# Patient Record
Sex: Female | Born: 2000 | Race: White | Hispanic: No | Marital: Married | Smoking: Never smoker
Health system: Southern US, Community
[De-identification: ages and names within clinical notes are randomized; demographics above are authoritative.]

## PROBLEM LIST (undated history)

## (undated) ENCOUNTER — Inpatient Hospital Stay: Payer: Self-pay

## (undated) ENCOUNTER — Inpatient Hospital Stay

## (undated) DIAGNOSIS — N926 Irregular menstruation, unspecified: Secondary | ICD-10-CM

## (undated) DIAGNOSIS — R109 Unspecified abdominal pain: Secondary | ICD-10-CM

## (undated) DIAGNOSIS — K589 Irritable bowel syndrome without diarrhea: Secondary | ICD-10-CM

## (undated) DIAGNOSIS — Z808 Family history of malignant neoplasm of other organs or systems: Secondary | ICD-10-CM

## (undated) DIAGNOSIS — G43009 Migraine without aura, not intractable, without status migrainosus: Secondary | ICD-10-CM

## (undated) DIAGNOSIS — M940 Chondrocostal junction syndrome [Tietze]: Secondary | ICD-10-CM

## (undated) DIAGNOSIS — Q211 Atrial septal defect, unspecified: Secondary | ICD-10-CM

## (undated) DIAGNOSIS — R1110 Vomiting, unspecified: Secondary | ICD-10-CM

## (undated) HISTORY — DX: Irritable bowel syndrome, unspecified: K58.9

## (undated) HISTORY — PX: WISDOM TOOTH EXTRACTION: SHX21

## (undated) HISTORY — DX: Irregular menstruation, unspecified: N92.6

## (undated) HISTORY — DX: Vomiting, unspecified: R11.10

## (undated) HISTORY — DX: Atrial septal defect, unspecified: Q21.10

## (undated) HISTORY — DX: Chondrocostal junction syndrome (tietze): M94.0

## (undated) HISTORY — DX: Unspecified abdominal pain: R10.9

## (undated) HISTORY — DX: Family history of malignant neoplasm of other organs or systems: Z80.8

## (undated) HISTORY — DX: Migraine without aura, not intractable, without status migrainosus: G43.009

---
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
## 2019-01-04 DIAGNOSIS — H5213 Myopia, bilateral: Secondary | ICD-10-CM | POA: Diagnosis not present

## 2019-01-26 DIAGNOSIS — H5213 Myopia, bilateral: Secondary | ICD-10-CM | POA: Diagnosis not present

## 2019-01-27 ENCOUNTER — Other Ambulatory Visit (HOSPITAL_COMMUNITY)
Admission: RE | Admit: 2019-01-27 | Discharge: 2019-01-27 | Disposition: A | Discharge status: Home / Self Care | Payer: Medicaid Other | Source: Ambulatory Visit | Attending: Nurse Practitioner | Admitting: Nurse Practitioner

## 2019-01-27 ENCOUNTER — Encounter: Payer: Self-pay | Admitting: Nurse Practitioner

## 2019-01-27 ENCOUNTER — Ambulatory Visit (INDEPENDENT_AMBULATORY_CARE_PROVIDER_SITE_OTHER): Payer: Medicaid Other | Admitting: Nurse Practitioner

## 2019-01-27 ENCOUNTER — Other Ambulatory Visit: Payer: Self-pay

## 2019-01-27 VITALS — BP 116/66 | HR 80 | Temp 97.7°F | Ht 63.5 in | Wt 112.2 lb

## 2019-01-27 DIAGNOSIS — Z118 Encounter for screening for other infectious and parasitic diseases: Secondary | ICD-10-CM | POA: Diagnosis not present

## 2019-01-27 DIAGNOSIS — Z0000 Encounter for general adult medical examination without abnormal findings: Secondary | ICD-10-CM | POA: Diagnosis not present

## 2019-01-27 DIAGNOSIS — Z3041 Encounter for surveillance of contraceptive pills: Secondary | ICD-10-CM | POA: Diagnosis not present

## 2019-01-27 MED ORDER — NORGESTREL-ETHINYL ESTRADIOL 0.3-30 MG-MCG PO TABS: 1.0000 | ORAL_TABLET | Freq: Every day | ORAL | 4 refills | Status: DC

## 2019-01-27 NOTE — Patient Instructions (Addendum)
April Fleming,   Thank you for coming in to clinic today.  1. Work on good stress management strategies like deep breathing instead of your vape pen/nicotine use.  2. Keep living a healthy, active lifestyle  3. You will be due for [place].  This means you should eat no food or drink after midnight.  Drink only water or coffee without cream/sugar on the morning of your lab visit. - Please go ahead and schedule a "Lab Only" visit in the morning at the clinic for lab draw in the next 7 days. - Your results will be available about 2-3 days after blood draw.  If you have set up a MyChart account, you can can log in to MyChart online to view your results and a brief explanation. Also, we can discuss your results together at your next office visit if you would like.   Please schedule a follow-up appointment with [name], AGNP. Return in about 1 year (around 01/28/2020) for annual physical.  If you have any other questions or concerns, please feel free to call the clinic or send a message through [place]. You may also schedule an earlier appointment if necessary.  You will receive a survey after today's visit either digitally by e-mail or paper by [employer]. Your experiences and feedback matter to [place].  Please respond so we know how we are doing as we provide care for you.   [name], DNP, AGNP-BC Adult Gerontology Nurse Practitioner [place]

## 2019-01-27 NOTE — Progress Notes (Signed)
Subjective:    Patient ID: April Fleming, female    DOB: 2001-07-15, 18 y.o.   MRN: [id number]  April Fleming is a 18 y.o. female presenting on 01/27/2019 for Annual Exam   HPI Annual Physical Exam Patient has been feeling well.  They have no acute concerns today.  Patient is very pleased with current contraception.  Sleeps 6-8 hours per night uninterrupted.  Works to second shift two nights per week, so this disrupts sleep routine.  HEALTH MAINTENANCE: Weight/BMI: helathy Physical activity: regular with work, none regularly outside of work Diet: Regular Seatbelt: Always Sunscreen: starting more regular use Chlamydia screening: due, negative in December 2019.  No new sexual partners. HIV: due Optometry: recent Dentistry: no regular adult, but recently has had pediatric care end.  VACCINES: Tetanus: done Influenza: declines Gardasil: recommended - check NCIR - patient does not recall if she received, but wants to get if needed.  Social History   Tobacco Use  . Smoking status: Never Smoker  . Smokeless tobacco: Never Used  Substance Use Topics  . Alcohol use: Never    Frequency: Never  . Drug use: Never    Review of Systems Per HPI unless specifically indicated above     Objective:    BP 139/68 (BP Location: Right Arm, Patient Position: Sitting, Cuff Size: Small)   Pulse 80   Temp 97.7 F (36.5 C) (Oral)   Ht 5' 3.5" (1.613 m)   Wt 112 lb 3.2 oz (50.9 kg)   LMP 12/27/2018   BMI 19.56 kg/m   Wt Readings from Last 3 Encounters:  01/27/19 112 lb 3.2 oz (50.9 kg) (22 %, Z= -0.79)*  12/10/18 113 lb 3.2 oz (51.3 kg) (24 %, Z= -0.71)*  10/19/18 116 lb 3.2 oz (52.7 kg) (31 %, Z= -0.50)*   * Growth percentiles are based on CDC (Girls, 2-20 Years) data.    Physical Exam Vitals signs and nursing note reviewed.  Constitutional:      General: She is not in acute distress.    Appearance: She is well-developed.  HENT:     Head: Normocephalic and atraumatic.    Right Ear: External ear normal.     Left Ear: External ear normal.     Nose: Nose normal.  Eyes:     Conjunctiva/sclera: Conjunctivae normal.     Pupils: Pupils are equal, round, and reactive to light.  Neck:     Musculoskeletal: Normal range of motion and neck supple.     Thyroid: No thyromegaly.     Vascular: No JVD.     Trachea: No tracheal deviation.  Cardiovascular:     Rate and Rhythm: Normal rate and regular rhythm.     Heart sounds: Normal heart sounds. No murmur. No friction rub. No gallop.   Pulmonary:     Effort: Pulmonary effort is normal. No respiratory distress.     Breath sounds: Normal breath sounds.  Chest:     Comments: Breast - Normal exam w/ symmetric breasts, no mass, no nipple discharge, no skin changes or tenderness. Abdominal:     General: Bowel sounds are normal. There is no distension.     Palpations: Abdomen is soft.     Tenderness: There is no abdominal tenderness.  Musculoskeletal: Normal range of motion.  Lymphadenopathy:     Cervical: No cervical adenopathy.  Skin:    General: Skin is warm and dry.     Capillary Refill: Capillary refill takes less than 2 seconds.  Neurological:  General: No focal deficit present.     Mental Status: She is alert and oriented to person, place, and time. Mental status is at baseline.     Cranial Nerves: No cranial nerve deficit.     Gait: Gait normal.  Psychiatric:        Behavior: Behavior normal.        Thought Content: Thought content normal.        Judgment: Judgment normal.    Results for orders placed or performed in visit on 12/10/18  Urine cytology ancillary only  Result Value Ref Range   Chlamydia Negative    Neisseria gonorrhea Negative       Assessment & Plan:   Problem List Items Addressed This Visit    None    Visit Diagnoses    Encounter for annual physical exam    -  Primary   Relevant Orders   Lipid panel   HIV Antibody (routine testing w rflx)   COMPLETE METABOLIC PANEL WITH GFR    CBC with Differential/Platelet   TSH   Urine cytology ancillary only   Screening for chlamydial disease       Relevant Orders   Urine cytology ancillary only   Encounter for surveillance of contraceptive pills       Relevant Medications   norgestrel-ethinyl estradiol (LO/OVRAL,CRYSELLE) 0.3-30 MG-MCG tablet    Annual physical exam with no new findings.  Well adult with no acute concerns.  No current problems with new OCP.  Continue without changes.  Plan: 1. Obtain health maintenance screenings as above according to age. - Increase physical activity to 30 minutes most days of the week.  - Eat healthy diet high in vegetables and fruits; low in refined carbohydrates. - Screening labs and tests as ordered 2. Return 1 year for annual physical.    Meds ordered this encounter  Medications  . norgestrel-ethinyl estradiol (LO/OVRAL,CRYSELLE) 0.3-30 MG-MCG tablet    Sig: Take 1 tablet by mouth daily.    Dispense:  3 Package    Refill:  4    Order Specific Question:   Supervising Provider    Answer:   Olin Hauser [[id number]]    Follow up plan: Return in about 1 year (around 01/28/2020) for annual physical.  [name], DNP, AGPCNP-BC Adult Gerontology Primary Care Nurse Practitioner [facility] 01/27/2019, 2:03 PM

## 2019-01-28 ENCOUNTER — Encounter: Payer: Medicaid Other | Admitting: Nurse Practitioner

## 2019-01-28 LAB — URINE CYTOLOGY ANCILLARY ONLY: Chlamydia: NEGATIVE

## 2019-02-02 ENCOUNTER — Other Ambulatory Visit: Payer: Medicaid Other

## 2019-05-18 ENCOUNTER — Ambulatory Visit: Payer: Medicaid Other | Admitting: Family Medicine

## 2019-05-18 ENCOUNTER — Other Ambulatory Visit: Payer: Self-pay

## 2019-05-18 ENCOUNTER — Encounter: Payer: Self-pay | Admitting: Family Medicine

## 2019-05-18 ENCOUNTER — Ambulatory Visit (INDEPENDENT_AMBULATORY_CARE_PROVIDER_SITE_OTHER): Payer: Medicaid Other | Admitting: Family Medicine

## 2019-05-18 DIAGNOSIS — Z3041 Encounter for surveillance of contraceptive pills: Secondary | ICD-10-CM

## 2019-05-18 NOTE — Patient Instructions (Addendum)
AVS info given by phone. Contact info for [facility] [address] [facility]. Referral info. No MyChart access

## 2019-05-18 NOTE — Progress Notes (Signed)
Virtual Visit via Telephone The purpose of this virtual visit is to provide medical care while limiting exposure to the novel coronavirus (COVID19) for both patient and office staff.  Consent was obtained for phone visit:  Yes.   Answered questions that patient had about telehealth interaction:  Yes.   I discussed the limitations, risks, security and privacy concerns of performing an evaluation and management service by telephone. I also discussed with the patient that there may be a patient responsible charge related to this service. The patient expressed understanding and agreed to proceed.  Patient Location: Home Provider Location: [facility] (Office)  PCP is [name], AGPCNP-BC - I am currently covering during her maternity leave.   ---------------------------------------------------------------------- Chief Complaint  Patient presents with  . Contraception    wants referral to OBGYN    S: Reviewed CMA documentation. I have called patient and gathered additional HPI as follows:  Contraception Management - Last visit with PCP, for yearly check up in 01/2019, treated with OCP has been changed in past, see prior notes for background information. - Interval update with some side effects on current OCP requesting referral to GYN for more options on birth control - Today patient reports described some hair loss, thought secondary to hormonal OCP, and developed some frequent headaches, and her emotions are not normal lately with some mood swings - She is continuing to take her OCP daily and has requested to be referred to Dr Benjaman Kindler locally at [facility] chronic headaches, migraines - Denies any abdominal pain, nausea vomiting, abnormal bleeding, fevers chills   Past Medical History:  Diagnosis Date  . Abdominal pain   . Vomiting    Social History   Tobacco Use  . Smoking status: Never Smoker  . Smokeless tobacco: Current User  Substance Use  Topics  . Alcohol use: Never    Frequency: Never  . Drug use: Never    Current Outpatient Medications:  .  aspirin-acetaminophen-caffeine (EXCEDRIN MIGRAINE) 250-250-65 MG tablet, Take by mouth every 6 (six) hours as needed for headache., Disp: , Rfl:  .  Biotin 10 MG CAPS, Take by mouth., Disp: , Rfl:  .  cetirizine (ZYRTEC) 10 MG tablet, Take 1 tablet (10 mg total) by mouth daily., Disp: 30 tablet, Rfl: 11 .  Multiple Vitamins-Calcium (ONE-A-DAY WOMENS PO), Take by mouth., Disp: , Rfl:  .  Multiple Vitamins-Minerals (HAIR SKIN AND NAILS FORMULA) TABS, Take by mouth., Disp: , Rfl:  .  norgestrel-ethinyl estradiol (LO/OVRAL,CRYSELLE) 0.3-30 MG-MCG tablet, Take 1 tablet by mouth daily., Disp: 3 Package, Rfl: 4 .  rizatriptan (MAXALT) 5 MG tablet, MAY TAKE A SECOND DOSE AFTER 2 HOURS IF NEEDED., Disp: , Rfl:   Depression screen [facility] 2/9 05/18/2019 01/27/2019 09/17/2018  Decreased Interest 0 0 0  Down, Depressed, Hopeless 0 0 2  PHQ - 2 Score 0 0 2  Altered sleeping - 2 2  Tired, decreased energy - 1 3  Change in appetite - 0 0  Feeling bad or failure about yourself  - 0 0  Trouble concentrating - 0 1  Moving slowly or fidgety/restless - 0 0  Suicidal thoughts - 0 0  PHQ-9 Score - 3 8  Difficult doing work/chores - Not difficult at all Not difficult at all    GAD 7 : Generalized Anxiety Score 09/17/2018  Nervous, Anxious, on Edge 3  Control/stop worrying 3  Worry too much - different things 3  Trouble relaxing 1  Restless 0  Easily annoyed or irritable 3  Afraid - awful might happen 3  Total GAD 7 Score 16  Anxiety Difficulty Somewhat difficult    -------------------------------------------------------------------------- O: No physical exam performed due to remote telephone encounter.   No results found for this or any previous visit (from the past 2160 hour(s)).  -------------------------------------------------------------------------- A&P:  Problem List Items Addressed  This Visit    None    Visit Diagnoses    Encounter for surveillance of contraceptive pills    -  Primary   Relevant Orders   Ambulatory referral to Obstetrics / Gynecology     Clinically with side effects on OCP and complicated history with migraines Trial of prior OCP  Recommend change in current therapy, may benefit from other non pill form of contraception  Referral to [place] patient requested Dr Benjaman Kindler for contraception management, has tried few OCP in past with side effect, history of migraines and headaches    No orders of the defined types were placed in this encounter.  Orders Placed This Encounter  Procedures  . Ambulatory referral to Obstetrics / Gynecology    Referral Priority:   Routine    Referral Type:   Consultation    Referral Reason:   Specialty Services Required    Referred to Provider:   Benjaman Kindler, MD    Requested Specialty:   Obstetrics and Gynecology    Number of Visits Requested:   1    Follow-up: - Return as needed  Patient verbalizes understanding with the above medical recommendations including the limitation of remote medical advice.  Specific follow-up and call-back criteria were given for patient to follow-up or seek medical care more urgently if needed.   - Time spent in direct consultation with patient on phone: 7 minutes   Nobie Putnam, [facility] 05/18/2019, 3:39 PM

## 2019-05-24 ENCOUNTER — Telehealth: Payer: Self-pay

## 2019-05-24 NOTE — Telephone Encounter (Signed)
Pt called complaining of frequent migraine, nausea, vomiting, diarrhea and bouts of constipation. X 3 weeks. Pt was scheduled Wednesday, June 3rd.

## 2019-05-26 ENCOUNTER — Ambulatory Visit
Admission: RE | Admit: 2019-05-26 | Discharge: 2019-05-26 | Disposition: A | Discharge status: Home / Self Care | Payer: Medicaid Other | Source: Ambulatory Visit | Attending: Family Medicine | Admitting: Family Medicine

## 2019-05-26 ENCOUNTER — Ambulatory Visit (INDEPENDENT_AMBULATORY_CARE_PROVIDER_SITE_OTHER): Payer: Medicaid Other | Admitting: Family Medicine

## 2019-05-26 ENCOUNTER — Other Ambulatory Visit: Payer: Self-pay

## 2019-05-26 ENCOUNTER — Encounter: Payer: Self-pay | Admitting: Family Medicine

## 2019-05-26 DIAGNOSIS — Z3041 Encounter for surveillance of contraceptive pills: Secondary | ICD-10-CM

## 2019-05-26 DIAGNOSIS — R1012 Left upper quadrant pain: Secondary | ICD-10-CM | POA: Diagnosis not present

## 2019-05-26 DIAGNOSIS — R350 Frequency of micturition: Secondary | ICD-10-CM | POA: Diagnosis not present

## 2019-05-26 DIAGNOSIS — R197 Diarrhea, unspecified: Secondary | ICD-10-CM | POA: Diagnosis not present

## 2019-05-26 DIAGNOSIS — K5900 Constipation, unspecified: Secondary | ICD-10-CM

## 2019-05-26 DIAGNOSIS — R110 Nausea: Secondary | ICD-10-CM

## 2019-05-26 LAB — POCT URINALYSIS DIPSTICK
Bilirubin, UA: NEGATIVE
Blood, UA: NEGATIVE
Glucose, UA: NEGATIVE
Ketones, UA: NEGATIVE
Leukocytes, UA: NEGATIVE
Nitrite, UA: NEGATIVE
Odor: NEGATIVE
Protein, UA: NEGATIVE
Spec Grav, UA: <=1.005 — AB (ref 1.010–1.025)
Urobilinogen, UA: 0.2 E.U./dL
pH, UA: 5 (ref 5.0–8.0)

## 2019-05-26 LAB — POCT URINE PREGNANCY: Preg Test, Ur: NEGATIVE

## 2019-05-26 MED ORDER — DICYCLOMINE HCL 10 MG PO CAPS: 10.0000 mg | ORAL_CAPSULE | Freq: Three times a day (TID) | ORAL | 1 refills | Status: DC

## 2019-05-26 MED ORDER — POLYETHYLENE GLYCOL 3350 17 GM/SCOOP PO POWD: 17.0000 g | Freq: Every day | ORAL | 1 refills | Status: DC | PRN

## 2019-05-26 NOTE — Progress Notes (Signed)
Subjective:    Patient ID: April Fleming, female    DOB: 2001-11-29, 18 y.o.   MRN: [id number]  April Fleming is a 18 y.o. female presenting on 05/26/2019 for Abdominal Pain (ribcage pain, irregular bm, constipation, diarrhea and nauseated x 3 weeks   )  Virtual / Telehealth Encounter - Video Visit via [email or web address] The purpose of this virtual visit is to provide medical care while limiting exposure to the novel coronavirus (COVID19) for both patient and office staff.  Consent was obtained for remote visit:  Yes.   Answered questions that patient had about telehealth interaction:  Yes.   I discussed the limitations, risks, security and privacy concerns of performing an evaluation and management service by video/telephone. I also discussed with the patient that there may be a patient responsible charge related to this service. The patient expressed understanding and agreed to proceed.  Patient Location: Home Provider Location: [facility] (Office)  PCP is [name], AGPCNP-BC - I am currently covering during her maternity leave.   HPI   LUQ Abdominal Pain / Constipation mixed (irregular bowel habits) / Nausea Reports symptoms started about 3 weeks ago, described sensation of feeling like she had to go to the bathroom, sometimes had difficulty having BM occasional some constipation vs diarrhea, then developed some sharp pain episodic initially come and go, now past few days gradually worsening seems to be persistent now for past few days, last BM was yesterday did not resolve her symptoms. Not linked to any diet, eating and drinking is not making her symptoms worse or improved. Worse when moving around while walking and other activity. - Fam history of gallbladder disease, grandmother had gallbladder removed in early 41s - History of migraines, daith ear pierced 1 year ago with good results - Now starting to have some migraine symptoms, flare ups recently, in past she had  seen specialist thought main trigger for migraines was stress, taking Excedrin PRN with good results resolving headache. - Admits nausea without vomiting - Denies any fevers, chills, sweats, dark stool, blood in stool, acid reflux burning regurgitation,   Additional information She is on OCP. New med >3-4 months now, she said in past had to change OCP due to some abdominal symptoms in past, but feels completely different. Patient's last menstrual period was 04/25/2019.  She is sexually active, but denies pregnancy. Agrees to come in for Urine Pregnancy test before imaging   Depression screen [facility] 2/9 05/18/2019 01/27/2019 09/17/2018  Decreased Interest 0 0 0  Down, Depressed, Hopeless 0 0 2  PHQ - 2 Score 0 0 2  Altered sleeping - 2 2  Tired, decreased energy - 1 3  Change in appetite - 0 0  Feeling bad or failure about yourself  - 0 0  Trouble concentrating - 0 1  Moving slowly or fidgety/restless - 0 0  Suicidal thoughts - 0 0  PHQ-9 Score - 3 8  Difficult doing work/chores - Not difficult at all Not difficult at all    Social History   Tobacco Use  . Smoking status: Never Smoker  . Smokeless tobacco: Current User  Substance Use Topics  . Alcohol use: Never    Frequency: Never  . Drug use: Never    Review of Systems Per HPI unless specifically indicated above     Objective:    LMP 04/25/2019   Wt Readings from Last 3 Encounters:  01/27/19 112 lb 3.2 oz (50.9 kg) (22 %, Z= -0.79)*  12/10/18 113 lb 3.2 oz (51.3 kg) (24 %, Z= -0.71)*  10/19/18 116 lb 3.2 oz (52.7 kg) (31 %, Z= -0.50)*   * Growth percentiles are based on CDC (Girls, 2-20 Years) data.    Physical Exam   Note examination was completely remotely via video observation objective data only  Gen - well-appearing, no acute distress or apparent pain, comfortable HEENT - eyes appear clear without discharge or redness Heart/Lungs - cannot examine virtually - observed no evidence of coughing or labored breathing.  Abd - cannot examine virtually, observed localized area of pain as demonstrated by patient is LUQ mostly Skin - face visible today- no rash Neuro - awake, alert, oriented Psych - not anxious appearing  I have personally reviewed the radiology report from 05/26/19 KUB STAT.  CLINICAL DATA:  18 year old female with sharp left upper quadrant pain for 3 weeks with nausea, mix of diarrhea and constipation.  EXAM: ABDOMEN - 1 VIEW  COMPARISON:  Chest radiograph 06/16/2018.  FINDINGS: Supine views. Negative lung bases. Non obstructed bowel gas pattern. Average to slightly above average volume of retained stool. Normal abdominal and pelvic visceral contours. No osseous abnormality identified.  IMPRESSION: Normal bowel gas pattern with average to slightly above average volume of retained stool.   Electronically Signed   By: Genevie Ann M.D.   On: 05/26/2019 10:57  Results for orders placed or performed in visit on 05/26/19  CBC with Differential/Platelet  Result Value Ref Range   WBC 4.4 3.8 - 10.8 Thousand/uL   RBC 4.29 3.80 - 5.10 Million/uL   Hemoglobin 12.7 11.7 - 15.5 g/dL   HCT 37.3 35.0 - 45.0 %   MCV 86.9 80.0 - 100.0 fL   MCH 29.6 27.0 - 33.0 pg   MCHC 34.0 32.0 - 36.0 g/dL   RDW 12.8 11.0 - 15.0 %   Platelets 244 140 - 400 Thousand/uL   MPV 9.9 7.5 - 12.5 fL   Neutro Abs 1,888 1,500 - 7,800 cells/uL   Lymphs Abs 2,006 850 - 3,900 cells/uL   Absolute Monocytes 356 200 - 950 cells/uL   Eosinophils Absolute 101 15 - 500 cells/uL   Basophils Absolute 48 0 - 200 cells/uL   Neutrophils Relative % 42.9 %   Total Lymphocyte 45.6 %   Monocytes Relative 8.1 %   Eosinophils Relative 2.3 %   Basophils Relative 1.1 %  POCT urine pregnancy  Result Value Ref Range   Preg Test, Ur Negative Negative  POCT urinalysis dipstick  Result Value Ref Range   Color, UA light yellow    Clarity, UA clear    Glucose, UA Negative Negative   Bilirubin, UA negative    Ketones, UA  negative    Spec Grav, UA <=1.005 (A) 1.010 - 1.025   Blood, UA negative    pH, UA 5.0 5.0 - 8.0   Protein, UA Negative Negative   Urobilinogen, UA 0.2 0.2 or 1.0 E.U./dL   Nitrite, UA negative    Leukocytes, UA Negative Negative   Appearance clear    Odor negative       Assessment & Plan:   Problem List Items Addressed This Visit    None    Visit Diagnoses    LUQ abdominal pain    -  Primary   Relevant Medications   dicyclomine (BENTYL) 10 MG capsule   Other Relevant Orders   COMPLETE METABOLIC PANEL WITH GFR   CBC with Differential/Platelet (Completed)   Lipase   DG Abd 1  View (Completed)   Nausea       Relevant Orders   COMPLETE METABOLIC PANEL WITH GFR   Lipase   Urinary frequency       Relevant Orders   POCT urinalysis dipstick (Completed)   Urine Culture   Encounter for surveillance of contraceptive pills       Relevant Orders   POCT urine pregnancy (Completed)   Constipation, unspecified constipation type       Relevant Medications   polyethylene glycol powder (GLYCOLAX/MIRALAX) 17 GM/SCOOP powder      Clinically uncertain etiology for LUQ abdominal pain for few weeks and with some associated symptoms, questionable if functional bowel. - History of similar issues on other OCP in past had to change, question if this is related  Patient asked to come by office today after her virtual visit was completed for STAT testing - Urine Pregnancy test was NEGATIVE today, she is cleared for imaging STAT KUB ordered at [place] [facility] STAT CMET CBC Lipase UA dipstick possibly indicative of UTI with trace leuks Ordered urine culture, pending  Plan After review of test results - notified patient - suspect some constipation is one of primary causes, sent rx Dicyclomine for abdominal pain PRN, and sent miralax with instructions titrate to result  Pending Urine culture - if UTI positive then will treat UTI - seems less consistent  Strict follow up and return  precautions given, if not improving or worsening - if still not improving, consider Abd [place] vs CT vs GI referral  Meds ordered this encounter  Medications  . dicyclomine (BENTYL) 10 MG capsule    Sig: Take 1 capsule (10 mg total) by mouth 4 (four) times daily -  before meals and at bedtime. As needed for abdominal pain / cramping    Dispense:  30 capsule    Refill:  1  . polyethylene glycol powder (GLYCOLAX/MIRALAX) 17 GM/SCOOP powder    Sig: Take 17-34 g by mouth daily as needed for mild constipation or moderate constipation. May continue longer or repeat course if problem returns.    Dispense:  255 g    Refill:  1    Follow-up: - Return in 1 week as needed abdominal pain  Patient verbalizes understanding with the above medical recommendations including the limitation of remote medical advice.  Specific follow-up and call-back criteria were given for patient to follow-up or seek medical care more urgently if needed.  Total duration of direct patient care provided via video conference: 27 minutes   Nobie Putnam, [facility] 05/26/2019, 8:53 AM

## 2019-05-26 NOTE — Patient Instructions (Addendum)
AVS info given directly to patient. No mychart access

## 2019-05-27 LAB — CBC WITH DIFFERENTIAL/PLATELET
Absolute Monocytes: 356 cells/uL (ref 200–950)
Basophils Absolute: 48 cells/uL (ref 0–200)
Basophils Relative: 1.1 %
Eosinophils Absolute: 101 cells/uL (ref 15–500)
Eosinophils Relative: 2.3 %
HCT: 37.3 % (ref 35.0–45.0)
Hemoglobin: 12.7 g/dL (ref 11.7–15.5)
Lymphs Abs: 2006 cells/uL (ref 850–3900)
MCH: 29.6 pg (ref 27.0–33.0)
MCHC: 34 g/dL (ref 32.0–36.0)
MCV: 86.9 fL (ref 80.0–100.0)
MPV: 9.9 fL (ref 7.5–12.5)
Monocytes Relative: 8.1 %
Neutro Abs: 1888 cells/uL (ref 1500–7800)
Neutrophils Relative %: 42.9 %
Platelets: 244 10*3/uL (ref 140–400)
RBC: 4.29 10*6/uL (ref 3.80–5.10)
RDW: 12.8 % (ref 11.0–15.0)
Total Lymphocyte: 45.6 %
WBC: 4.4 10*3/uL (ref 3.8–10.8)

## 2019-05-27 LAB — COMPLETE METABOLIC PANEL WITH GFR
AG Ratio: 1.6 (calc) (ref 1.0–2.5)
ALT: 11 U/L (ref 5–32)
AST: 17 U/L (ref 12–32)
Albumin: 4.4 g/dL (ref 3.6–5.1)
Alkaline phosphatase (APISO): 37 U/L (ref 36–128)
BUN: 7 mg/dL (ref 7–20)
CO2: 22 mmol/L (ref 20–32)
Calcium: 9.7 mg/dL (ref 8.9–10.4)
Chloride: 106 mmol/L (ref 98–110)
Creat: 0.64 mg/dL (ref 0.50–1.00)
GFR, Est African American: 150 mL/min/{1.73_m2} (ref >=60)
GFR, Est Non African American: 129 mL/min/{1.73_m2} (ref >=60)
Globulin: 2.8 g/dL (calc) (ref 2.0–3.8)
Glucose, Bld: 96 mg/dL (ref 65–99)
Potassium: 4.2 mmol/L (ref 3.8–5.1)
Sodium: 137 mmol/L (ref 135–146)
Total Bilirubin: 0.3 mg/dL (ref 0.2–1.1)
Total Protein: 7.2 g/dL (ref 6.3–8.2)

## 2019-05-27 LAB — URINE CULTURE
MICRO NUMBER:: 532800
SPECIMEN QUALITY:: ADEQUATE

## 2019-05-27 LAB — LIPASE: Lipase: 10 U/L (ref 7–60)

## 2019-05-28 ENCOUNTER — Telehealth: Payer: Self-pay

## 2019-05-28 NOTE — Telephone Encounter (Signed)
Open in error

## 2019-06-02 ENCOUNTER — Ambulatory Visit: Payer: Medicaid Other | Admitting: Family Medicine

## 2019-11-05 ENCOUNTER — Telehealth: Payer: Self-pay

## 2019-11-05 NOTE — Telephone Encounter (Signed)
Patient called requesting a refill on birth control Cryselle.

## 2019-11-05 NOTE — Telephone Encounter (Signed)
The pt was notified that she have refills that she just needs to contact her pharmacy for refills.

## 2020-01-31 ENCOUNTER — Other Ambulatory Visit: Payer: Self-pay | Admitting: Nurse Practitioner

## 2020-01-31 DIAGNOSIS — Z3041 Encounter for surveillance of contraceptive pills: Secondary | ICD-10-CM

## 2020-02-01 ENCOUNTER — Telehealth: Payer: Self-pay | Admitting: Family Medicine

## 2020-02-01 NOTE — Telephone Encounter (Signed)
Patient's Rx CRYSELLE-28 0.3-30 MG-MCG tablet [id number]  Was send yesterday, she called today requesting Cryselle not norgestrel and ethinyl estradiol called pharmacy they want new Rx to be send and chances are patient will end up paying out of pocket. Patient is aware.

## 2020-02-23 ENCOUNTER — Telehealth: Payer: Self-pay | Admitting: Family Medicine

## 2020-02-23 ENCOUNTER — Encounter: Payer: Self-pay | Admitting: Family Medicine

## 2020-02-23 ENCOUNTER — Ambulatory Visit (INDEPENDENT_AMBULATORY_CARE_PROVIDER_SITE_OTHER): Payer: Medicaid Other | Admitting: Family Medicine

## 2020-02-23 ENCOUNTER — Other Ambulatory Visit: Payer: Self-pay

## 2020-02-23 VITALS — BP 130/68 | HR 72 | Temp 98.7°F | Resp 16 | Ht 63.0 in | Wt 116.4 lb

## 2020-02-23 DIAGNOSIS — R140 Abdominal distension (gaseous): Secondary | ICD-10-CM | POA: Diagnosis not present

## 2020-02-23 DIAGNOSIS — R1010 Upper abdominal pain, unspecified: Secondary | ICD-10-CM

## 2020-02-23 DIAGNOSIS — R152 Fecal urgency: Secondary | ICD-10-CM | POA: Diagnosis not present

## 2020-02-23 DIAGNOSIS — R109 Unspecified abdominal pain: Secondary | ICD-10-CM | POA: Diagnosis not present

## 2020-02-23 NOTE — Progress Notes (Signed)
Subjective:    Patient ID: April Fleming, female    DOB: Oct 05, 2001, 19 y.o.   MRN: [id number]  April Fleming is a 19 y.o. female presenting on 02/23/2020 for Abdominal Pain (LUQ onset month but getting more frequent from past 2 weeks as per patient it's after she eats )   HPI   Abdominal Pain / Epigastric / Urgency / Increased Gas Known prior history of LUQ Abdominal pain, previous history 05/2019 similar issue however that was due to more constipation - Now current episode in past 1 month, worsening in past 2 weeks with episodic abdominal pain mostly epigastric with primarily eating. Will trigger her to go to bathroom for bowel movement and cause pain. Still has fairly constant abdominal discomfort that isn't improving and even between meals has symptoms. - She has tried to improve diet, drinking more water and eating more salads but it did not help, and she has had worsening symptoms with increasing urgency for bowel movement and abdominal pain, cramping, gas, only partial relief when she does go to bathroom. Says stool is soft not liquid or watery but it is not solid - Not tried any OTC remedy. May have Dicyclomine from previous but has not tried. - Denies eating hard candy, chewing gum, artificial sweetener or other additives - Family history of IBS (Grandmother), also Mother has history of stomach ulcers, Brother is lactose intolerant - Admits some increased gas, difficulty with burping but sometimes cannot - Denies any heartburn, upper GI pain, nausea vomiting, dark stools, blood in stool    Depression screen [facility] 2/9 05/18/2019 01/27/2019 09/17/2018  Decreased Interest 0 0 0  Down, Depressed, Hopeless 0 0 2  PHQ - 2 Score 0 0 2  Altered sleeping - 2 2  Tired, decreased energy - 1 3  Change in appetite - 0 0  Feeling bad or failure about yourself  - 0 0  Trouble concentrating - 0 1  Moving slowly or fidgety/restless - 0 0  Suicidal thoughts - 0 0  PHQ-9 Score - 3 8  Difficult  doing work/chores - Not difficult at all Not difficult at all    Social History   Tobacco Use  . Smoking status: Never Smoker  . Smokeless tobacco: Current User  Substance Use Topics  . Alcohol use: Never  . Drug use: Never    Review of Systems Per HPI unless specifically indicated above     Objective:    BP 140/65   Pulse 72   Temp 98.7 F (37.1 C) (Temporal)   Resp 16   Ht 5\' 3"  (1.6 m)   Wt 116 lb 6.4 oz (52.8 kg)   BMI 20.62 kg/m   Wt Readings from Last 3 Encounters:  02/23/20 116 lb 6.4 oz (52.8 kg) (26 %, Z= -0.63)*  01/27/19 112 lb 3.2 oz (50.9 kg) (22 %, Z= -0.79)*  12/10/18 113 lb 3.2 oz (51.3 kg) (24 %, Z= -0.71)*   * Growth percentiles are based on CDC (Girls, 2-20 Years) data.    Physical Exam Vitals and nursing note reviewed.  Constitutional:      General: She is not in acute distress.    Appearance: She is well-developed. She is not diaphoretic.     Comments: Well-appearing, comfortable, cooperative  HENT:     Head: Normocephalic and atraumatic.  Eyes:     General:        Right eye: No discharge.        Left eye: No  discharge.     Conjunctiva/sclera: Conjunctivae normal.  Cardiovascular:     Rate and Rhythm: Normal rate.  Pulmonary:     Effort: Pulmonary effort is normal.  Abdominal:     General: Bowel sounds are increased.     Palpations: Abdomen is soft. There is no fluid wave, hepatomegaly, splenomegaly or mass.     Tenderness: There is abdominal tenderness in the epigastric area and left upper quadrant. There is no guarding or rebound. Negative signs include Murphy's sign and McBurney's sign.     Hernia: No hernia is present.  Skin:    General: Skin is warm and dry.     Findings: No erythema or rash.  Neurological:     Mental Status: She is alert and oriented to person, place, and time.  Psychiatric:        Behavior: Behavior normal.     Comments: Well groomed, good eye contact, normal speech and thoughts    Results for orders  placed or performed in visit on 05/26/19  Urine Culture   Specimen: Urine  Result Value Ref Range   MICRO NUMBER: [id number]    SPECIMEN QUALITY: Adequate    Sample Source URINE    STATUS: FINAL    Result:      Single organism less than 10,000 CFU/mL isolated. These organisms, commonly found on external and internal genitalia, are considered colonizers. No further testing performed.  COMPLETE METABOLIC PANEL WITH GFR  Result Value Ref Range   Glucose, Bld 96 65 - 99 mg/dL   BUN 7 7 - 20 mg/dL   Creat 0.64 0.50 - 1.00 mg/dL   GFR, Est Non African American 129 > OR = 60 mL/min/1.82m2   GFR, Est African American 150 > OR = 60 mL/min/1.71m2   BUN/Creatinine Ratio NOT APPLICABLE 6 - 22 (calc)   Sodium 137 135 - 146 mmol/L   Potassium 4.2 3.8 - 5.1 mmol/L   Chloride 106 98 - 110 mmol/L   CO2 22 20 - 32 mmol/L   Calcium 9.7 8.9 - 10.4 mg/dL   Total Protein 7.2 6.3 - 8.2 g/dL   Albumin 4.4 3.6 - 5.1 g/dL   Globulin 2.8 2.0 - 3.8 g/dL (calc)   AG Ratio 1.6 1.0 - 2.5 (calc)   Total Bilirubin 0.3 0.2 - 1.1 mg/dL   Alkaline phosphatase (APISO) 37 36 - 128 U/L   AST 17 12 - 32 U/L   ALT 11 5 - 32 U/L  CBC with Differential/Platelet  Result Value Ref Range   WBC 4.4 3.8 - 10.8 Thousand/uL   RBC 4.29 3.80 - 5.10 Million/uL   Hemoglobin 12.7 11.7 - 15.5 g/dL   HCT 37.3 35.0 - 45.0 %   MCV 86.9 80.0 - 100.0 fL   MCH 29.6 27.0 - 33.0 pg   MCHC 34.0 32.0 - 36.0 g/dL   RDW 12.8 11.0 - 15.0 %   Platelets 244 140 - 400 Thousand/uL   MPV 9.9 7.5 - 12.5 fL   Neutro Abs 1,888 1,500 - 7,800 cells/uL   Lymphs Abs 2,006 850 - 3,900 cells/uL   Absolute Monocytes 356 200 - 950 cells/uL   Eosinophils Absolute 101 15 - 500 cells/uL   Basophils Absolute 48 0 - 200 cells/uL   Neutrophils Relative % 42.9 %   Total Lymphocyte 45.6 %   Monocytes Relative 8.1 %   Eosinophils Relative 2.3 %   Basophils Relative 1.1 %  Lipase  Result Value Ref Range   Lipase 10 7 -  60 U/L  POCT urine pregnancy  Result  Value Ref Range   Preg Test, Ur Negative Negative  POCT urinalysis dipstick  Result Value Ref Range   Color, UA light yellow    Clarity, UA clear    Glucose, UA Negative Negative   Bilirubin, UA negative    Ketones, UA negative    Spec Grav, UA <=1.005 (A) 1.010 - 1.025   Blood, UA negative    pH, UA 5.0 5.0 - 8.0   Protein, UA Negative Negative   Urobilinogen, UA 0.2 0.2 or 1.0 E.U./dL   Nitrite, UA negative    Leukocytes, UA Negative Negative   Appearance clear    Odor negative       Assessment & Plan:   Problem List Items Addressed This Visit    None    Visit Diagnoses    Upper abdominal pain    -  Primary   Relevant Orders   Ambulatory referral to Gastroenterology   Fecal urgency       Relevant Orders   Ambulatory referral to Gastroenterology   Abdominal bloating with cramps       Relevant Orders   Ambulatory referral to Gastroenterology      Clinically with worsening functional GI symptoms with epigastric pain, cramping, bloating now more constant but significantly worse with stool urgency during or right after eating, unable to isolate trigger food, seems to be most meals. - Prior history functional Gi symptoms constipation and abdominal pain back in 05/2019, improved w/ Miralax, Dicyclomine. - Fam history IBS, gastric ulcers, and lactose intolerance.  Plan - Since chronic recurrent episodes now, will pursue referral to GI - sent to [facility] - Limit dietary triggers if possible. Stay hydrated - May trial back on Dicyclomine if still has, also can trial Miralax again if need - Will defer other empiric treatments at this time. History not really suggestive of GERD but could be silent gerd gastritis is possible   Orders Placed This Encounter  Procedures  . Ambulatory referral to Gastroenterology    Referral Priority:   Routine    Referral Type:   Consultation    Referral Reason:   Specialty Services Required    Number of Visits Requested:   1     No  orders of the defined types were placed in this encounter.     Follow up plan: Return in about 4 weeks (around 03/22/2020), or if symptoms worsen or fail to improve, for abdominal pain / bloating.   Nobie Putnam, DO [facility] 02/23/2020, 8:17 AM

## 2020-02-23 NOTE — Patient Instructions (Addendum)
Thank you for coming to the office today.  Referral to GI for further evaluation - still considering possibility of a type of lactose intolerance, possible ulcer/stomach acid, or IBS is still possibility.  [facility]) [place], [place] [address] Phone: [phone]  OTC Peppermint Oil (Triple Coated Capsule) 180mg  take one 3 times daily to reduce diarrhea  Also can try Dicyclomine for cramping if you still have it, if need refill let me know.  Can try the Miralax powder for a clean out to see if this helps.  Please schedule a Follow-up Appointment to: Return in about 4 weeks (around 03/22/2020), or if symptoms worsen or fail to improve, for abdominal pain / bloating.  If you have any other questions or concerns, please feel free to call the office or send a message through [place]. You may also schedule an earlier appointment if necessary.  Additionally, you may be receiving a survey about your experience at our office within a few days to 1 week by e-mail or mail. We value your feedback.  Nobie Putnam, DO [place]

## 2020-02-23 NOTE — Telephone Encounter (Signed)
Attempted to call her back to review and answer questions.  Left her a voicemail.  Her GI Apt is 03/23/20.  She should have Bentyl to try for now, should take ONLY AS NEEDED right BEFORE meal or with first bites of meal to slow down symptoms and reduce cramping.  If needs more, she can contact us with the dosing on how often she takes it and I can order the appropriate # of pills for her for a month to last until that GI apt.  She can try miralax if she wants too  Also we advised her to try OTC Peppermint Oil (Triple Coated Capsule) 180mg  take one 3 times daily to reduce diarrhea  Follow-up or call back sooner if needed  Nobie Putnam, [facility] 02/23/2020, 4:19 PM

## 2020-02-23 NOTE — Telephone Encounter (Signed)
Patient called regarding he GI apt is on first week of April she was told to call back if it is schedule for longer than 2 weeks and had question about Bentyl, direction was given.

## 2020-03-23 ENCOUNTER — Other Ambulatory Visit: Payer: Self-pay

## 2020-03-23 ENCOUNTER — Encounter: Payer: Self-pay | Admitting: Gastroenterology

## 2020-03-23 ENCOUNTER — Ambulatory Visit (INDEPENDENT_AMBULATORY_CARE_PROVIDER_SITE_OTHER): Payer: Medicaid Other | Admitting: Gastroenterology

## 2020-03-23 ENCOUNTER — Other Ambulatory Visit: Payer: Self-pay | Admitting: Family Medicine

## 2020-03-23 VITALS — BP 126/77 | HR 91 | Temp 97.5°F | Ht 63.0 in | Wt 113.0 lb

## 2020-03-23 DIAGNOSIS — R1012 Left upper quadrant pain: Secondary | ICD-10-CM

## 2020-03-23 DIAGNOSIS — K580 Irritable bowel syndrome with diarrhea: Secondary | ICD-10-CM

## 2020-03-23 MED ORDER — PEPPERMINT OIL 90 MG PO CPCR: 1.0000 | ORAL_CAPSULE | Freq: Three times a day (TID) | ORAL | Status: DC | PRN

## 2020-03-23 MED ORDER — DICYCLOMINE HCL 10 MG PO CAPS: 10.0000 mg | ORAL_CAPSULE | Freq: Three times a day (TID) | ORAL | 1 refills | Status: DC

## 2020-03-23 MED ORDER — AMITRIPTYLINE HCL 25 MG PO TABS: 25.0000 mg | ORAL_TABLET | Freq: Every day | ORAL | 1 refills | Status: DC

## 2020-03-23 NOTE — Progress Notes (Signed)
April Darby, MD [address]  [place]  [place], [place] [address]  Main: [phone]  Fax: [phone]    Gastroenterology Consultation  Referring Provider:     [name] * Primary Care Physician:  April Fleming, April (Inactive) Primary Gastroenterologist:  April Fleming Reason for Consultation: Abdominal bloating, diarrhea        HPI:   April Fleming is a 19 y.o. female referred by Dr. Merrilyn Fleming, April Fleming, April (Inactive)  for consultation & management of chronic abdominal bloating and diarrhea.  Patient reports that for last 1 to 2 years, she has been experiencing spells of abdominal cramps, postprandial, nonbloody diarrhea as well as abdominal bloating.  She denies any abdominal pain.  Her most recent spell was in early March, lasted for 1 month.  She was started on dicyclomine before each meal and at bedtime by her PCP which seem to help.  She is also taking over-the-counter peppermint oil capsules which help very well.  She denies any weight loss, any particular food triggers.  She denies rectal bleeding.  Her most recent labs were unremarkable last year.  Her father passed away last year and has been coping up with it.  She generally feels anxious and stressful. She drinks 2 cans of soda daily She does not smoke or drink alcohol  NSAIDs: None  Antiplts/Anticoagulants/Anti thrombotics: None  GI Procedures: None She denies family history of IBD, celiac disease, GI malignancy  Past Medical History:  Diagnosis Date  . Abdominal pain   . Vomiting     History reviewed. No pertinent surgical history.   Current Outpatient Medications:  .  aspirin-acetaminophen-caffeine (EXCEDRIN MIGRAINE) 250-250-65 MG tablet, Take by mouth every 6 (six) hours as needed for headache., Disp: , Rfl:  .  Biotin 10 MG CAPS, Take by mouth., Disp: , Rfl:  .  CRYSELLE-28 0.3-30 MG-MCG tablet, TAKE 1 TABLET BY MOUTH EVERY DAY, Disp: 84 tablet, Rfl: 0 .   Multiple Vitamins-Calcium (ONE-A-DAY WOMENS PO), Take by mouth., Disp: , Rfl:  .  Multiple Vitamins-Minerals (HAIR SKIN AND NAILS FORMULA) TABS, Take by mouth., Disp: , Rfl:  .  amitriptyline (ELAVIL) 25 MG tablet, Take 1 tablet (25 mg total) by mouth at bedtime., Disp: 30 tablet, Rfl: 1 .  cetirizine (ZYRTEC) 10 MG tablet, Take 1 tablet (10 mg total) by mouth daily. (Patient not taking: Reported on 05/26/2019), Disp: 30 tablet, Rfl: 11 .  dicyclomine (BENTYL) 10 MG capsule, Take 1 capsule (10 mg total) by mouth 4 (four) times daily -  before meals and at bedtime. As needed for abdominal pain / cramping (Patient not taking: Reported on 03/23/2020), Disp: 30 capsule, Rfl: 1 .  rizatriptan (MAXALT) 5 MG tablet, MAY TAKE A SECOND DOSE AFTER 2 HOURS IF NEEDED., Disp: , Rfl:   Family History  Problem Relation Age of Onset  . COPD Mother   . Melanoma Father   . Melanoma Maternal Grandmother   . Healthy Maternal Grandfather   . Dementia Paternal Grandmother   . Healthy Other   . Healthy Other      Social History   Tobacco Use  . Smoking status: Never Smoker  . Smokeless tobacco: Current User  Substance Use Topics  . Alcohol use: Never  . Drug use: Never    Allergies as of 03/23/2020  . (No Known Allergies)    Review of Systems:    All systems reviewed and negative except where noted in HPI.  Physical Exam:  BP 126/77   Pulse 91   Temp (!) 97.5 F (36.4 C) (Temporal)   Ht 5\' 3"  (1.6 m)   Wt 113 lb (51.3 kg)   BMI 20.02 kg/m  No LMP recorded.  General:   Alert,  Well-developed, well-nourished, pleasant and cooperative in NAD Head:  Normocephalic and atraumatic. Eyes:  Sclera clear, no icterus.   Conjunctiva pink. Ears:  Normal auditory acuity. Nose:  No deformity, discharge, or lesions. Mouth:  No deformity or lesions,oropharynx pink & moist. Neck:  Supple; no masses or thyromegaly. Lungs:  Respirations even and unlabored.  Clear throughout to auscultation.   No wheezes,  crackles, or rhonchi. No acute distress. Heart:  Regular rate and rhythm; no murmurs, clicks, rubs, or gallops. Abdomen:  Normal bowel sounds. Soft, non-tender and non-distended without masses, hepatosplenomegaly or hernias noted.  No guarding or rebound tenderness.   Rectal: Not performed Msk:  Symmetrical without gross deformities. Good, equal movement & strength bilaterally. Pulses:  Normal pulses noted. Extremities:  No clubbing or edema.  No cyanosis. Neurologic:  Alert and oriented x3;  grossly normal neurologically. Skin:  Intact without significant lesions or rashes. No jaundice. Psych:  Alert and cooperative. Normal mood and affect.  Imaging Studies: Reviewed  Assessment and Plan:   April Fleming is a 19 y.o. female with history of anxiety is seen in consultation for chronic symptoms of abdominal bloating, postprandial cramps and nonbloody diarrhea with no other constitutional symptoms.  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

## 2020-03-24 ENCOUNTER — Telehealth: Payer: Self-pay | Admitting: Family Medicine

## 2020-03-24 NOTE — Telephone Encounter (Signed)
Called patient back.  She has some general questions on the IBS diagnosis, and asking about if she needs additional testing or stool tests in future.  Reviewed some basics with her that currently that is the working diagnosis based on her symptoms and response to treatment.  Most other diagnostic testing rules out other problems or more serious GI issues that her symptoms are not entirely consistent with.  Also she has not tried the Amitriptyline 25mg  nightly. She tells me that she reviewed the medication more and has some concerns taking it nightly since her symptoms are episodic, and she prefers not to rely on this type of a medication.  She is interested in other treatment suggestions on PRN basis or medicine with less "potential side effects".  Forwarded this note to her [facility] provider Dr Marius Ditch for review. Patient said she will follow back up with them next week by phone/message to check in.  Nobie Putnam, [facility] 03/24/2020, 2:16 PM

## 2020-03-24 NOTE — Telephone Encounter (Signed)
Patient seen by GI yesterday and diagnosed with IBS has some question regarding diagnosis called their office but is closed today.

## 2020-03-27 ENCOUNTER — Telehealth: Payer: Self-pay

## 2020-03-27 NOTE — Telephone Encounter (Signed)
I think I sent in prescription for bentyl for her   RV

## 2020-03-27 NOTE — Telephone Encounter (Signed)
Patient was seen in the office on 03/23/2020 for IBS. Patient states she only have symptoms that come and go. She states she does not want to take the Amitriptyline every night at bedtime. Patient wants a medication that she only has to take as needed when she has symptoms. Please advised. Patient knows you are out of the office this week

## 2020-03-29 NOTE — Telephone Encounter (Signed)
Pt advised to take the dicyclomine as needed.

## 2020-04-21 ENCOUNTER — Other Ambulatory Visit: Payer: Self-pay | Admitting: Nurse Practitioner

## 2020-04-21 DIAGNOSIS — Z3041 Encounter for surveillance of contraceptive pills: Secondary | ICD-10-CM

## 2020-04-21 MED ORDER — CRYSELLE-28 0.3-30 MG-MCG PO TABS: 1.0000 | ORAL_TABLET | Freq: Every day | ORAL | 0 refills | Status: DC

## 2020-04-21 NOTE — Telephone Encounter (Signed)
Copied from [place] [id number]. Topic: Quick Communication - Rx Refill/Question >> Apr 21, 2020  9:21 AM Rainey Pines A wrote: Medication:CRYSELLE-28 0.3-30 MG-MCG tablet  Has the patient contacted their pharmacy? {yes (Agent: If no, request that the patient contact the pharmacy for the refill.) (Agent: If yes, when and what did the pharmacy advise?)Contact PCP  Preferred Pharmacy (with phone number or street name): [place], [place].  Phone:  [phone] Fax:  [phone]     Agent: Please be advised that RX refills may take up to 3 business days. We ask that you follow-up with your pharmacy.

## 2020-04-21 NOTE — Telephone Encounter (Signed)
Requested Prescriptions  Pending Prescriptions Disp Refills  . norgestrel-ethinyl estradiol (CRYSELLE-28) 0.3-30 MG-MCG tablet 84 tablet 0    Sig: Take 1 tablet by mouth daily.     OB/GYN:  Contraceptives Passed - 04/21/2020  9:25 AM      Passed - Last BP in normal range    BP Readings from Last 1 Encounters:  03/23/20 126/77         Passed - Valid encounter within last 12 months    Recent Outpatient Visits          1 month ago Upper abdominal pain   [place], [place]   11 months ago LUQ abdominal pain   [place], DO   11 months ago Encounter for surveillance of contraceptive pills   [facility] Olin Hauser, DO   1 year ago Encounter for annual physical exam   [facility] Mikey College, NP   1 year ago Encounter for surveillance of contraceptive pills   [facility] Merrilyn Puma, Jerrel Ivory, NP      Future Appointments            In 1 month Vanga, Tally Due, MD [place]

## 2020-05-24 ENCOUNTER — Ambulatory Visit: Payer: Medicaid Other | Admitting: Gastroenterology

## 2020-07-03 ENCOUNTER — Other Ambulatory Visit: Payer: Self-pay

## 2020-07-03 ENCOUNTER — Encounter: Payer: Self-pay | Admitting: Emergency Medicine

## 2020-07-03 ENCOUNTER — Emergency Department
Admission: EM | Admit: 2020-07-03 | Discharge: 2020-07-03 | Disposition: A | Discharge status: Home / Self Care | Payer: Medicaid Other | Attending: Emergency Medicine | Admitting: Emergency Medicine

## 2020-07-03 ENCOUNTER — Emergency Department: Payer: Medicaid Other

## 2020-07-03 DIAGNOSIS — Y998 Other external cause status: Secondary | ICD-10-CM | POA: Insufficient documentation

## 2020-07-03 DIAGNOSIS — Y9389 Activity, other specified: Secondary | ICD-10-CM | POA: Diagnosis not present

## 2020-07-03 DIAGNOSIS — R42 Dizziness and giddiness: Secondary | ICD-10-CM | POA: Diagnosis not present

## 2020-07-03 DIAGNOSIS — R55 Syncope and collapse: Secondary | ICD-10-CM | POA: Diagnosis not present

## 2020-07-03 DIAGNOSIS — W19XXXA Unspecified fall, initial encounter: Secondary | ICD-10-CM | POA: Diagnosis not present

## 2020-07-03 DIAGNOSIS — Y9289 Other specified places as the place of occurrence of the external cause: Secondary | ICD-10-CM | POA: Insufficient documentation

## 2020-07-03 DIAGNOSIS — S199XXA Unspecified injury of neck, initial encounter: Secondary | ICD-10-CM | POA: Diagnosis not present

## 2020-07-03 DIAGNOSIS — Z7982 Long term (current) use of aspirin: Secondary | ICD-10-CM | POA: Insufficient documentation

## 2020-07-03 DIAGNOSIS — S069X9A Unspecified intracranial injury with loss of consciousness of unspecified duration, initial encounter: Secondary | ICD-10-CM | POA: Diagnosis present

## 2020-07-03 LAB — URINALYSIS, COMPLETE (UACMP) WITH MICROSCOPIC
Bacteria, UA: NONE SEEN
Bilirubin Urine: NEGATIVE
Glucose, UA: NEGATIVE mg/dL
Hgb urine dipstick: NEGATIVE
Ketones, ur: NEGATIVE mg/dL
Leukocytes,Ua: NEGATIVE
Nitrite: NEGATIVE
Protein, ur: NEGATIVE mg/dL
Specific Gravity, Urine: 1.008 (ref 1.005–1.030)
pH: 5 (ref 5.0–8.0)

## 2020-07-03 LAB — CBC
HCT: 37.4 % (ref 36.0–46.0)
Hemoglobin: 12.7 g/dL (ref 12.0–15.0)
MCH: 28.2 pg (ref 26.0–34.0)
MCHC: 34 g/dL (ref 30.0–36.0)
MCV: 83.1 fL (ref 80.0–100.0)
Platelets: 273 10*3/uL (ref 150–400)
RBC: 4.5 MIL/uL (ref 3.87–5.11)
RDW: 13.7 % (ref 11.5–15.5)
WBC: 7.3 10*3/uL (ref 4.0–10.5)
nRBC: 0 % (ref 0.0–0.2)

## 2020-07-03 LAB — BASIC METABOLIC PANEL
Anion gap: 7 (ref 5–15)
BUN: 9 mg/dL (ref 6–20)
CO2: 24 mmol/L (ref 22–32)
Calcium: 9.2 mg/dL (ref 8.9–10.3)
Chloride: 103 mmol/L (ref 98–111)
Creatinine, Ser: 0.7 mg/dL (ref 0.44–1.00)
GFR calc Af Amer: >60 mL/min (ref >60)
GFR calc non Af Amer: >60 mL/min (ref >60)
Glucose, Bld: 94 mg/dL (ref 70–99)
Potassium: 3.8 mmol/L (ref 3.5–5.1)
Sodium: 134 mmol/L — ABNORMAL LOW (ref 135–145)

## 2020-07-03 LAB — PREGNANCY, URINE: Preg Test, Ur: NEGATIVE

## 2020-07-03 LAB — POCT PREGNANCY, URINE: Preg Test, Ur: NEGATIVE

## 2020-07-03 MED ORDER — ACETAMINOPHEN 325 MG PO TABS
650.0000 mg | ORAL_TABLET | Freq: Once | ORAL | Status: AC
  Administered 2020-07-03: 650 mg via ORAL
  Filled 2020-07-03: qty 2

## 2020-07-03 MED ORDER — IBUPROFEN 400 MG PO TABS
400.0000 mg | ORAL_TABLET | Freq: Once | ORAL | Status: AC
  Administered 2020-07-03: 400 mg via ORAL
  Filled 2020-07-03: qty 1

## 2020-07-03 MED ORDER — SODIUM CHLORIDE 0.9% FLUSH: 3.0000 mL | Freq: Once | INTRAVENOUS | Status: DC

## 2020-07-03 NOTE — ED Provider Notes (Signed)
[facility] Emergency Department Provider Note   ____________________________________________    I have reviewed the triage vital signs and the nursing notes.   HISTORY  Chief Complaint Loss of Consciousness     HPI April Fleming is a 19 y.o. female who presents after a syncopal episode.  Patient works at a veterinary clinic, she reports that she became overly warm and then very lightheaded and apparently syncopized.  She denies chest pain or palpitations.  No shortness of breath.  No nausea or vomiting.  Had not eaten breakfast but that is not atypical for her.  No recent new medications, has not had Covid vaccines.  No viral symptoms.  Currently feels well although has pain in the back of her head because she did hit her head when she fell.  Past Medical History:  Diagnosis Date  . Abdominal pain   . Vomiting     Patient Active Problem List   Diagnosis Date Noted  . Intractable migraine without aura and without status migrainosus 06/17/2018  . Temporomandibular joint disorder (TMJ) 06/17/2018  . Abdominal pain   . Vomiting     History reviewed. No pertinent surgical history.  Prior to Admission medications   Medication Sig Start Date End Date Taking? Authorizing Provider  amitriptyline (ELAVIL) 25 MG tablet Take 1 tablet (25 mg total) by mouth at bedtime. 03/23/20   Lin Landsman, MD  aspirin-acetaminophen-caffeine (EXCEDRIN MIGRAINE) [id number] MG tablet Take by mouth every 6 (six) hours as needed for headache.    [provider]  Biotin 10 MG CAPS Take by mouth.    [provider]  cetirizine (ZYRTEC) 10 MG tablet Take 1 tablet (10 mg total) by mouth daily. Patient not taking: Reported on 05/26/2019 07/28/18   Mikey College, NP  dicyclomine (BENTYL) 10 MG capsule Take 1 capsule (10 mg total) by mouth 4 (four) times daily -  before meals and at bedtime. As needed for abdominal pain / cramping Patient not taking:  Reported on 03/23/2020 03/23/20   Lin Landsman, MD  Multiple Vitamins-Calcium (ONE-A-DAY WOMENS PO) Take by mouth.    [provider]  Multiple Vitamins-Minerals (HAIR SKIN AND NAILS FORMULA) TABS Take by mouth.    [provider]  norgestrel-ethinyl estradiol (CRYSELLE-28) 0.3-30 MG-MCG tablet Take 1 tablet by mouth daily. 04/21/20   [name], Devonne Doughty, DO  Peppermint Oil 90 MG CPCR Take 1 capsule by mouth 3 (three) times daily as needed. 03/23/20   [name], Devonne Doughty, DO  rizatriptan (MAXALT) 5 MG tablet MAY TAKE A SECOND DOSE AFTER 2 HOURS IF NEEDED. 04/30/18   [provider]     Allergies Patient has no known allergies.  Family History  Problem Relation Age of Onset  . COPD Mother   . Melanoma Father   . Melanoma Maternal Grandmother   . Healthy Maternal Grandfather   . Dementia Paternal Grandmother   . Healthy Other   . Healthy Other     Social History Social History   Tobacco Use  . Smoking status: Never Smoker  . Smokeless tobacco: Current User  Vaping Use  . Vaping Use: Former  . Quit date: 05/12/2019  . Substances: Nicotine, Flavoring  . Devices: self-filled vaporizer  Substance Use Topics  . Alcohol use: Never  . Drug use: Never    Review of Systems  Constitutional: No fever/chills Eyes: No visual changes.  ENT: No nasal injury Cardiovascular: Denies chest pain.  No palpitations Respiratory: Denies  shortness of breath. Gastrointestinal: No nausea vomiting Genitourinary: Negative for groin injury Musculoskeletal: Negative for back pain. Skin: Negative for rash. Neurological: No focal weakness   ____________________________________________   PHYSICAL EXAM:  VITAL SIGNS: ED Triage Vitals  Enc Vitals Group     BP 07/03/20 1117 (!) 151/98     Pulse Rate 07/03/20 1117 (!) 102     Resp 07/03/20 1117 16     Temp 07/03/20 1117 98.1 F (36.7 C)     Temp Source 07/03/20 1117 Oral     SpO2 07/03/20 1117 100 %      Weight 07/03/20 1114 51.3 kg (113 lb 1.5 oz)     Height 07/03/20 1114 1.6 m (5\' 3" )     Head Circumference --      Peak Flow --      Pain Score 07/03/20 1113 7     Pain Loc --      Pain Edu? --      Excl. in [place]? --     Constitutional: Alert and oriented. No acute distress  Nose: No congestion/rhinnorhea. Mouth/Throat: Mucous membranes are moist.   Neck: No vertebral tenderness palpation, no pain with axial load Cardiovascular: Normal rate, regular rhythm. Grossly normal heart sounds.  Good peripheral circulation. Respiratory: Normal respiratory effort.  No retractions. Gastrointestinal: Soft and nontender. No distention.  No CVA tenderness.  Musculoskeletal: No lower extremity tenderness nor edema.  Warm and well perfused Neurologic:  Normal speech and language. No gross focal neurologic deficits are appreciated.  Skin:  Skin is warm, dry and intact. No rash noted. Psychiatric: Mood and affect are normal. Speech and behavior are normal.  ____________________________________________   LABS (all labs ordered are listed, but only abnormal results are displayed)  Labs Reviewed  BASIC METABOLIC PANEL - Abnormal; Notable for the following components:      Result Value   Sodium 134 (*)    All other components within normal limits  URINALYSIS, COMPLETE (UACMP) WITH MICROSCOPIC - Abnormal; Notable for the following components:   Color, Urine STRAW (*)    APPearance CLEAR (*)    All other components within normal limits  CBC  PREGNANCY, URINE  POCT PREGNANCY, URINE  CBG MONITORING, ED   ____________________________________________  EKG  ED ECG REPORT I, Lavonia Drafts, the attending physician, personally viewed and interpreted this ECG.  Date: 07/03/2020  Rhythm: normal sinus rhythm QRS Axis: normal Intervals: normal ST/T Wave abnormalities: normal Narrative Interpretation: no evidence of acute ischemia  ____________________________________________  RADIOLOGY  CT  head and cervical spine unremarkable ____________________________________________   PROCEDURES  Procedure(s) performed: No  Procedures   Critical Care performed: No ____________________________________________   INITIAL IMPRESSION / ASSESSMENT AND PLAN / ED COURSE  Pertinent labs & imaging results that were available during my care of the patient were reviewed by me and considered in my medical decision making (see chart for details).  Patient presents after a syncopal episode.  Differential includes vasovagal syncope, dehydration, viral illness.  Patient feels well currently, no recent fevers or chills.  Lab work today is overall quite reassuring.  CT head and cervical spine are unremarkable.  EKG is normal.  Strongly suspect vasovagal syncope given description of event, no negation for admission at this time.  Have referred to cardiology for further work-up as needed.  Return precautions discussed    ____________________________________________   FINAL CLINICAL IMPRESSION(S) / ED DIAGNOSES  Final diagnoses:  Syncope and collapse        Note:  This  document was prepared using Systems analyst and may include unintentional dictation errors.   Lavonia Drafts, MD 07/03/20 [phone]

## 2020-07-03 NOTE — ED Triage Notes (Signed)
While at work, patient states she became very dizzy and then, per coworkers, fell to ground and was unconscious x 10 seconds.  Fall was unwitnessed.  AAOx3.  Skin warm and dry.  C/O head pain where head hit floor.

## 2020-07-05 ENCOUNTER — Other Ambulatory Visit: Payer: Self-pay

## 2020-07-05 ENCOUNTER — Telehealth: Payer: Self-pay | Admitting: *Deleted

## 2020-07-05 ENCOUNTER — Encounter: Payer: Self-pay | Admitting: Family Medicine

## 2020-07-05 ENCOUNTER — Ambulatory Visit (INDEPENDENT_AMBULATORY_CARE_PROVIDER_SITE_OTHER): Payer: Medicaid Other | Admitting: Family Medicine

## 2020-07-05 VITALS — BP 125/77 | HR 72 | Temp 98.4°F | Resp 16 | Ht 63.0 in | Wt 115.6 lb

## 2020-07-05 DIAGNOSIS — R55 Syncope and collapse: Secondary | ICD-10-CM | POA: Diagnosis not present

## 2020-07-05 DIAGNOSIS — F0781 Postconcussional syndrome: Secondary | ICD-10-CM | POA: Diagnosis not present

## 2020-07-05 DIAGNOSIS — R011 Cardiac murmur, unspecified: Secondary | ICD-10-CM | POA: Diagnosis not present

## 2020-07-05 NOTE — Progress Notes (Signed)
Subjective:    Patient ID: April Fleming, female    DOB: 2001/03/18, 19 y.o.   MRN: [id number]  April Fleming is a 19 y.o. female presenting on 07/05/2020 for Hospitalization Follow-up (syncope --Loss of Consciousness--HA) and Concussion (as per pt hit her Head at work and felt Dizzy--went to the hospital--little blurry or not clear with Left eye --still has HA onset 3 days )   HPI   ED FOLLOW-UP VISIT  Hospital/Location: [facility] Date of ED Visit: 07/03/20  Reason for Presenting to ED: Syncope Primary (+Secondary) Diagnosis: Syncope, Concussion  FOLLOW-UP  - ED provider note and record have been reviewed - Patient presents today about 2 days after recent ED visit. Brief summary of recent course, patient had symptoms of sudden sickness to stomach and nausea, she did have some water and mountain dew usually does not eat much breakfast in morning, also she did have a very similar feeling 1 week prior, felt sudden "warm or hot feeling" and she sat down and then drank water and felt okay, she felt very "warm and hot" suddenly, she described vision "went black" and she "blacked out" she fell from standing, she hit the back of her head either against wall or floor but this was not witnessed, estimate that she had passed out for about 10-15 seconds until she came too, co-worker came to her, and was reported that her "eyes were rolling back in her head and her face was twitching", presented to ED by personal vehicle taken to [facility], got lightheaded ultimately driven to [facility] ED, testing in ED with EKG, CT Head Neck without any identified abnormality see results below, chemistry labs BMET mild low sodium 134 otherwise normal, normal glucose, CBC.  - Today reports overall has done well after discharge from ED. Symptoms of headache have persisted but somewhat better, she has L eye blurry vision and some occasional numbness at times, dizziness worse with activity at times, some fogginess,  lightheaded  Note she has history of astigmatism from prior eye doctor, has glasses to wear when driving. Has not been back recently  She saw [facility] back in 2019 for migraines.  No prior syncopal events.  She does not feel ready to return to work.  Depression screen [facility] 2/9 05/18/2019 01/27/2019 09/17/2018  Decreased Interest 0 0 0  Down, Depressed, Hopeless 0 0 2  PHQ - 2 Score 0 0 2  Altered sleeping - 2 2  Tired, decreased energy - 1 3  Change in appetite - 0 0  Feeling bad or failure about yourself  - 0 0  Trouble concentrating - 0 1  Moving slowly or fidgety/restless - 0 0  Suicidal thoughts - 0 0  PHQ-9 Score - 3 8  Difficult doing work/chores - Not difficult at all Not difficult at all    Social History   Tobacco Use  . Smoking status: Never Smoker  . Smokeless tobacco: Current User  Vaping Use  . Vaping Use: Former  . Quit date: 05/12/2019  . Substances: Nicotine, Flavoring  . Devices: self-filled vaporizer  Substance Use Topics  . Alcohol use: Never  . Drug use: Never    Review of Systems Per HPI unless specifically indicated above     Objective:    BP 125/77   Pulse 72   Temp 98.4 F (36.9 C) (Temporal)   Resp 16   Ht 5\' 3"  (1.6 m)   Wt 115 lb 9.6 oz (52.4 kg)  LMP 06/27/2020   SpO2 100%   BMI 20.48 kg/m   Wt Readings from Last 3 Encounters:  07/05/20 115 lb 9.6 oz (52.4 kg)  07/03/20 113 lb 1.5 oz (51.3 kg)  03/23/20 113 lb (51.3 kg) (20 %, Z= -0.84)*   * Growth percentiles are based on CDC (Girls, 2-20 Years) data.   In office visual acuity reading today:  both 20/15--R and L 20/20 also has Astigmatism on left side   Physical Exam Vitals and nursing note reviewed.  Constitutional:      General: She is not in acute distress.    Appearance: She is well-developed. She is not diaphoretic.     Comments: Well-appearing, comfortable, cooperative  HENT:     Head: Normocephalic and atraumatic.  Eyes:     General:        Right eye: No  discharge.        Left eye: No discharge.     Extraocular Movements: Extraocular movements intact.     Conjunctiva/sclera: Conjunctivae normal.     Pupils: Pupils are equal, round, and reactive to light.  Neck:     Thyroid: No thyromegaly.  Cardiovascular:     Rate and Rhythm: Normal rate and regular rhythm.     Heart sounds: Murmur (2/6 systolic murmur sternal border) heard.   Pulmonary:     Effort: Pulmonary effort is normal. No respiratory distress.     Breath sounds: Normal breath sounds. No wheezing or rales.  Musculoskeletal:        General: Normal range of motion.     Cervical back: Normal range of motion and neck supple.     Comments: Upper / Lower Extremities: - Normal muscle tone, strength bilateral upper extremities 5/5, lower extremities 5/5 - Bilateral shoulders, knees, wrist, ankles without deformity, tenderness, effusion - Normal Gait   Lymphadenopathy:     Cervical: No cervical adenopathy.  Skin:    General: Skin is warm and dry.     Findings: No erythema or rash.  Neurological:     General: No focal deficit present.     Mental Status: She is alert and oriented to person, place, and time.     Cranial Nerves: No cranial nerve deficit.     Sensory: No sensory deficit.     Motor: No weakness.  Psychiatric:        Behavior: Behavior normal.     Comments: Well groomed, good eye contact, normal speech and thoughts      I have personally reviewed the radiology report from 07/03/20.  CLINICAL DATA:  The patient suffered an episode of dizziness, syncope and a fall this morning. Initial encounter.  EXAM: CT HEAD WITHOUT CONTRAST  CT CERVICAL SPINE WITHOUT CONTRAST  TECHNIQUE: Multidetector CT imaging of the head and cervical spine was performed following the standard protocol without intravenous contrast. Multiplanar CT image reconstructions of the cervical spine were also generated.  COMPARISON:  None.  FINDINGS: CT HEAD FINDINGS  Brain: No  evidence of acute infarction, hemorrhage, hydrocephalus, extra-axial collection or mass lesion/mass effect.  Vascular: No hyperdense vessel or unexpected calcification.  Skull: Normal. Negative for fracture or focal lesion.  Sinuses/Orbits: Negative.  Other: None.  CT CERVICAL SPINE FINDINGS  Alignment: Normal with straightening of lordosis noted.  Skull base and vertebrae: No acute fracture. No primary bone lesion or focal pathologic process.  Soft tissues and spinal canal: No prevertebral fluid or swelling. No visible canal hematoma.  Disc levels:  Disc space height is maintained  at all levels.  Upper chest: Negative.  Other: None.  IMPRESSION: Negative head and cervical spine CT scans.   Electronically Signed   By: Inge Rise M.D.   On: 07/03/2020 11:43  Results for orders placed or performed during the hospital encounter of 37/10/62  Basic metabolic panel  Result Value Ref Range   Sodium 134 (L) 135 - 145 mmol/L   Potassium 3.8 3.5 - 5.1 mmol/L   Chloride 103 98 - 111 mmol/L   CO2 24 22 - 32 mmol/L   Glucose, Bld 94 70 - 99 mg/dL   BUN 9 6 - 20 mg/dL   Creatinine, Ser 0.70 0.44 - 1.00 mg/dL   Calcium 9.2 8.9 - 10.3 mg/dL   GFR calc non Af Amer >60 >60 mL/min   GFR calc Af Amer >60 >60 mL/min   Anion gap 7 5 - 15  CBC  Result Value Ref Range   WBC 7.3 4.0 - 10.5 K/uL   RBC 4.50 3.87 - 5.11 MIL/uL   Hemoglobin 12.7 12.0 - 15.0 g/dL   HCT 37.4 36 - 46 %   MCV 83.1 80.0 - 100.0 fL   MCH 28.2 26.0 - 34.0 pg   MCHC 34.0 30.0 - 36.0 g/dL   RDW 13.7 11.5 - 15.5 %   Platelets 273 150 - 400 K/uL   nRBC 0.0 0.0 - 0.2 %  Urinalysis, Complete w Microscopic  Result Value Ref Range   Color, Urine STRAW (A) YELLOW   APPearance CLEAR (A) CLEAR   Specific Gravity, Urine 1.008 1.005 - 1.030   pH 5.0 5.0 - 8.0   Glucose, UA NEGATIVE NEGATIVE mg/dL   Hgb urine dipstick NEGATIVE NEGATIVE   Bilirubin Urine NEGATIVE NEGATIVE   Ketones, ur NEGATIVE  NEGATIVE mg/dL   Protein, ur NEGATIVE NEGATIVE mg/dL   Nitrite NEGATIVE NEGATIVE   Leukocytes,Ua NEGATIVE NEGATIVE   RBC / HPF 0-5 0 - 5 RBC/hpf   WBC, UA 0-5 0 - 5 WBC/hpf   Bacteria, UA NONE SEEN NONE SEEN   Squamous Epithelial / LPF 0-5 0 - 5   Mucus PRESENT   Pregnancy, urine  Result Value Ref Range   Preg Test, Ur NEGATIVE NEGATIVE  Pregnancy, urine POC  Result Value Ref Range   Preg Test, Ur NEGATIVE NEGATIVE      Assessment & Plan:   Problem List Items Addressed This Visit    Post concussion syndrome - Primary   Relevant Orders   Ambulatory referral to Neurology   Heart murmur    Other Visit Diagnoses    Vasovagal syncope       Relevant Orders   Ambulatory referral to Neurology      Suspected most likely vasovagal syncopal episode given history and reassuring clinical exam, with recurrence episodess x 1 week with initial pre-syncopal episode that did not result in syncope and then the syncopal event that occurred. - No significant red flag symptoms associated with episodes to suggest a more serious possible cardiac or neurologic etiology.  - Ultimately other considerations would include - cannot rule out any seizure activity based on description of her post-syncopal appearance and abnormal eye movement, suspect this is normal following the event and there was no report of any post-ictal state, seems unlikely but could consider further Neuro EEG work up - Additionally she has chronic heart murmur, present and reported to her by previous PCP, she has not had any cardiac symptoms. It was discussed today and advised that if other evaluations  are negative still and she has concerns or new symptoms or repeat episodes we could pursue ECHOcardiogram testing.  - Reviewed hospital record, EKG, labs, CT Head Neck imaging, urine test see above.  Plan: 1. Reassurance, counseled on vasovagal syncope 2. Improve hydration, limit caffeine / mountain dew, may be goal to add more  nutrition in AM 3. Educated on plan if recurrent symptoms, if any concerning features involving chest pain / dyspnea or other acute symptoms, or any exertional features, advised to return to ED immediately. If similar episode without any red flag symptoms, brief and self limited, should advise our office immediately can seek care in office or Urgent Care, otherwise follow-up. If recurrence soon would consider further work up in future such as ECHO vs Cardiology  #Post Concussion Syndrome New acute concussion with persistent sequela/symptoms, onset 7/12 - 2 days ago following syncopal episode with traumatic posterior head injury without laceration or other acute injury - See above - CT imaging did not identify any acute head injury or brain injury - There was temporary loss of consciousness - She has constellation of neurological / cognitive symptoms. - No prior history of concussion  - Currently gradual improvement in 48 hours, hemodynamically stable  Plan: 1. Discussion today on concussion management for return to work - given persistent symptoms with dizziness, headache, mild cognitive difficulty, advised that patient should remain out of work, for up to 1 week, advised to limit provoking activities - she requested to return sooner on Monday, gave her 2 notes if dramatic improvement may return sooner otherwise 1 week. Can extend time if indicated 2. For headaches / symptoms continue tylenol / excedrin / nsaid  Notes written - printed given to patient.  Referral to Neurologist (return to [facility], previous patient in 01/2018 for Migraines - Will request assistance with evaluation of syncopal episode - may consider EEG or other adv imaging testing. Additionally can evaluate her post-concussive syndrome.  Orders Placed This Encounter  Procedures  . Ambulatory referral to Neurology    Referral Priority:   Routine    Referral Type:   Consultation    Referral Reason:   Specialty Services  Required    Requested Specialty:   Neurology    Number of Visits Requested:   1     No orders of the defined types were placed in this encounter.    Follow up plan: Return in about 4 weeks (around 08/02/2020), or if symptoms worsen or fail to improve, for vasovagal syncopal / post-concussion syndrome.   Nobie Putnam, [facility] 07/05/2020, 4:35 PM

## 2020-07-05 NOTE — Telephone Encounter (Signed)
  Transition Care Management Follow-up Telephone Call  . Date of discharge and from where:07/03/20 [place]  . How have you been since you were released from the hospital? Started feeling light head today, and motrin for pain not helping   . Any questions or concerns?   Items Reviewed: Marland Kitchen Did the pt receive and understand the discharge instructions provided? Yes; . Medications obtained and verified? Yes  . Any new allergies since your discharge? No  . Dietary orders reviewed? Yes . Do you have support at home? Yes  Functional Questionnaire: (I = Independent and D = Dependent)  ADLs: Independent Bathing/Dressing:Independent Meal Prep: Independent Eating: Independent Maintaining continence: Independent Transferring/Ambulation: Independent Managing Meds: Independent Follow up appointments reviewed:  PCP Hospital f/u appt confirmed? No  pt adviosed tp contact her PCP for hospital follow up appt, and onging symptoms Specialist Hospital f/u appt confirmed? n/a .  Are transportation arrangements needed? No   If their condition worsens, is the pt aware to call PCP or go to the EmergencyDept.? Yes  Was the patient provided with contact information for the PCP's office or ED? yes  Was to pt encouraged to call back with questions or concerns? Yes  Lenor Coffin, RN, BSN, [place] [facility] [place] [phone]

## 2020-07-05 NOTE — Patient Instructions (Addendum)
Thank you for coming to the office today.  Notes for work, okay to return if concussion symptoms have improved or nearly resolved, goal to be nearly headache free but mild headache is okay, goal to have normalized vision, no confusion or dizzy.  Likely post concussion syndrome - will refer back to [facility] for evaluation  They can evaluate for the rare possibility of Seizures, to be determined.  -----------------------------------  1. As discussed, I do not know the exact cause of your syncopal episodes (passing out), usually we divide this into either concerning or less concerning syncopal episodes. The most common type is Vasovagal Syncope, often described as you did with feeling flushed or sweating, lightheaded or dizzy, usually these are random episodes, triggered by a variety of causes (can be stress, emotional, physical, straining with exercise or bowel movement even, dehydration poor intake). Still a medical concern, but usually more of a benign problem, there is limited treatment or testing to be done for this type of syncope.  The concerning syncope is either caused by Cardiac (Heart) or Neurogenic (Brain), usually provoked by exertional activity, associated with high risk symptoms chest pain, tightness or pressure, shortness of breath, headache, or stroke like symptoms significant facial or arm/leg weakness, numbness, or related to seizure activity - if you develop any of these symptoms seek help immediately at hospital ED.  From now on, be mindful of possible syncopal episodes, I would encourage increase water hydration, try using a bottle or way to measure water intake, goal for at least 12-16 oz container about 2-3 times a day, can reduce soda or caffeine intake, as this tends to cause you to be more dehydrated.   If syncopal episode occurs again without any of the above significant red flag symptoms, and it resolves on it's own and you don't feel persistently sick, then you  may notify our office, follow-up in our office, or seek treatment at Urgent Care, we can discuss future referrals such as Cardiology and other testing.   Please schedule a Follow-up Appointment to: Return in about 4 weeks (around 08/02/2020), or if symptoms worsen or fail to improve, for vasovagal syncopal / post-concussion syndrome.  If you have any other questions or concerns, please feel free to call the office or send a message through [facility]. You may also schedule an earlier appointment if necessary.  Additionally, you may be receiving a survey about your experience at our office within a few days to 1 week by e-mail or mail. We value your feedback.  Nobie Putnam, DO [place]

## 2020-07-11 ENCOUNTER — Telehealth: Payer: Self-pay

## 2020-07-11 NOTE — Telephone Encounter (Signed)
Copied from [place] [phone]. Topic: Referral - Status >> Jul 11, 2020 11:03 AM Alanda Slim E wrote: Reason for CRM: Pt called about the status of her neurology referral/ Pt stated she still hasnt heard from them and wanted to know if she needs to call them / please advise

## 2020-07-11 NOTE — Telephone Encounter (Signed)
Spoke to the patient--apt is made for 07/26/2020.

## 2020-07-11 NOTE — Telephone Encounter (Signed)
Keep apt as scheduled 8/4 [facility]  Nobie Putnam, [facility] 07/11/2020, 4:30 PM

## 2020-07-13 DIAGNOSIS — R55 Syncope and collapse: Secondary | ICD-10-CM | POA: Diagnosis not present

## 2020-07-18 ENCOUNTER — Other Ambulatory Visit: Payer: Self-pay | Admitting: Family Medicine

## 2020-07-18 DIAGNOSIS — Z3041 Encounter for surveillance of contraceptive pills: Secondary | ICD-10-CM

## 2020-07-18 NOTE — Telephone Encounter (Signed)
Requested Prescriptions  Pending Prescriptions Disp Refills  . LOW-OGESTREL 0.3-30 MG-MCG tablet [Pharmacy Med Name: LOW-OGESTREL 0.3-30 MG-MCG TAB] 84 tablet 0    Sig: TAKE 1 TABLET BY MOUTH ONCE DAILY     OB/GYN:  Contraceptives Passed - 07/18/2020  3:26 PM      Passed - Last BP in normal range    BP Readings from Last 1 Encounters:  07/05/20 125/77         Passed - Valid encounter within last 12 months    Recent Outpatient Visits          1 week ago Post concussion syndrome   [place], DO   4 months ago Upper abdominal pain   [place], DO   1 year ago LUQ abdominal pain   [place], Devonne Doughty, DO   1 year ago Encounter for surveillance of contraceptive pills   [facility] Olin Hauser, DO   1 year ago Encounter for annual physical exam   [facility] Merrilyn Puma, Jerrel Ivory, NP

## 2020-09-01 ENCOUNTER — Other Ambulatory Visit: Payer: Self-pay

## 2020-09-01 ENCOUNTER — Ambulatory Visit (INDEPENDENT_AMBULATORY_CARE_PROVIDER_SITE_OTHER): Payer: Medicaid Other | Admitting: Family Medicine

## 2020-09-01 ENCOUNTER — Encounter: Payer: Self-pay | Admitting: Family Medicine

## 2020-09-01 ENCOUNTER — Telehealth: Payer: Self-pay

## 2020-09-01 VITALS — BP 131/58 | HR 99 | Temp 98.2°F | Ht 63.0 in | Wt 120.8 lb

## 2020-09-01 DIAGNOSIS — F419 Anxiety disorder, unspecified: Secondary | ICD-10-CM

## 2020-09-01 DIAGNOSIS — F410 Panic disorder [episodic paroxysmal anxiety] without agoraphobia: Secondary | ICD-10-CM | POA: Insufficient documentation

## 2020-09-01 DIAGNOSIS — F411 Generalized anxiety disorder: Secondary | ICD-10-CM | POA: Insufficient documentation

## 2020-09-01 DIAGNOSIS — R55 Syncope and collapse: Secondary | ICD-10-CM | POA: Diagnosis not present

## 2020-09-01 MED ORDER — FLUOXETINE HCL 10 MG PO TABS: 10.0000 mg | ORAL_TABLET | Freq: Every day | ORAL | 0 refills | Status: DC

## 2020-09-01 NOTE — Telephone Encounter (Signed)
Called her back. She said that she already scheduled with Cyndia Skeeters, FNP at 3:00pm today  She has no further questions, will speak with Elmyra Ricks.  I reviewed her case with Elmyra Ricks as well. Recent syncopal episode, head injury, post concussion.  She does not have a formal diagnosis of Anxiety on chart, but has reported anxiety in past from what I can recall and she has some PHQ symptoms but no formal diagnosis of Major Depression.  [place], DO [facility] 09/01/2020, 12:41 PM

## 2020-09-01 NOTE — Patient Instructions (Signed)
I have sent in a prescription for fluoxetine to begin taking 10mg  daily.  We can plan to re-evaluate how you are doing on this medication in 2 weeks.  A referral to Cardiology has been placed today.  If you have not heard from the specialty office or our referral coordinator within 1 week, please let us know and we will follow up with the referral coordinator for an update.  As we discussed, there are clinics at [place] that do specialize in dysautonomia if this is where your diagnosis progresses to.  Can wear compression socks, increase your water to 2-3 liters of water per day (64-100 ounces of water), increase your salt intake.  Keep a journal of your symptoms.  Keep your upcoming appointment with Neurology for your EEG  The following recommendations are helpful adjuncts for helping rebalance your mood.  Eat a nourishing diet. Ensure adequate intake of calories, protein, carbs, fat, vitamins, and minerals. Prioritize whole foods at each meal, including meats, vegetables, fruits, nuts and seeds, etc.   Avoid inflammatory and/or "junk" foods, such as sugar, omega-6 fats, refined grains, chemicals, and preservatives are common in packaged and prepared foods. Minimize or completely avoid these ingredients and stick to whole foods with little to no additives. Cook from scratch as much as possible for more control over what you eat  Get enough sleep. Poor sleep is significantly associated with depression and anxiety. Make 7-9 hours of sleep nightly a top priority  Exercise appropriately. Exercise is known to improve brain functioning and boost mood. Aim for 30 minutes of daily physical activity. Avoid "overtraining," which can cause mental disturbances  Assess your light exposure. Not enough natural light during the day and too much artificial light can have a major impact on your mood. Get outside as often as possible during daylight hours. Minimize light exposure after dark and avoid the use  of electronics that give off blue light before bed  Manage your stress.  Use daily stress management techniques such as meditation, yoga, or mindfulness to retrain your brain to respond differently to stress. Try deep breathing to deactivate your "fight or flight" response.  There are many of sources with apps like Headspace, Calm or a variety of YouTube videos (videos from [employer] have guided meditation)  Prioritize your social life. Work on building social support with new friends or improve current relationships. Consider getting a pet that allows for companionship, social interaction, and physical touch. Try volunteering or joining a faith-based community to increase your sense of purpose  4-7-8 breathing technique at bedtime: breathe in to count of 4, hold breath for count of 7, exhale for count of 8; do 3-5 times for letting go of overactive thoughts  Take time to play Unstructured "play" time can help reduce anxiety and depression Options for play include music, games, sports, dance, art, etc.  Try to add daily omega 3 fatty acids, magnesium, B complex, and balanced amino acid supplements to help improve mood and anxiety.  We will plan to see you back in 2 weeks for medication recheck  You will receive a survey after today's visit either digitally by e-mail or paper by [place] mail. Your experiences and feedback matter to [place].  Please respond so we know how we are doing as we provide care for you.  Call us with any questions/concerns/needs.  It is my goal to be available to you for your health concerns.  Thanks for choosing me to be a partner in  your healthcare needs!  Harlin Rain, FNP-C Family Nurse Practitioner [facility] Phone: [phone]

## 2020-09-01 NOTE — Progress Notes (Signed)
Subjective:    Patient ID: April Fleming, female    DOB: [birth year], 19 y.o.   MRN: [id number]  April Fleming is a 19 y.o. female presenting on 09/01/2020 for Anxiety (pt state that since her episode of syncope she felt like her anxiety levels have increased. She is more alert to how she is feeling and always extra cautious about how she feel. Sge state when she drives alone she gets nervous and start having heart palpations and feel like she start having panic attack.  ) and Foot Swelling (bilateral feet swelling w/ tingling x 1 week. She state the tingle was so bad lastnight she was unable to rest. She been currently trying to treat te symptoms with compression socks )   HPI  [name] presents to clinic for concerns of anxiety and foot tingling and some swelling.  Reports she had been seen by neurology after an episode of syncope.  Neurology has recommended cardiology evaluation for syncope and an EEG.  She was unable to complete the EEG at her last visit due to timing but will be rescheduling this.  Has a history of tendonitis in her right foot, but is now having increasing discomfort in her left foot, has recently been to the beach and walking on the sand which has irritated this.  Reports her anxiety has been increased and she feels anxious daily, has difficulty driving when she is in the car and reports feelings of palpitations, has recently decreased her caffeine intake and is drinking 5-6 bottles of water per day.  Depression screen [facility] 2/9 09/01/2020 05/18/2019 01/27/2019  Decreased Interest 1 0 0  Down, Depressed, Hopeless 1 0 0  PHQ - 2 Score 2 0 0  Altered sleeping 2 - 2  Tired, decreased energy 2 - 1  Change in appetite 1 - 0  Feeling bad or failure about yourself  0 - 0  Trouble concentrating 0 - 0  Moving slowly or fidgety/restless 1 - 0  Suicidal thoughts 0 - 0  PHQ-9 Score 8 - 3  Difficult doing work/chores Somewhat difficult - Not difficult at all    Social History     Tobacco Use  . Smoking status: Never Smoker  . Smokeless tobacco: Current User  Vaping Use  . Vaping Use: Former  . Quit date: 05/12/2019  . Substances: Nicotine, Flavoring  . Devices: self-filled vaporizer  Substance Use Topics  . Alcohol use: Never  . Drug use: Never    Review of Systems  Constitutional: Negative.   HENT: Negative.   Eyes: Negative.   Respiratory: Negative.   Cardiovascular: Positive for palpitations. Negative for chest pain and leg swelling.  Gastrointestinal: Negative.   Endocrine: Negative.   Genitourinary: Negative.   Musculoskeletal: Positive for arthralgias. Negative for back pain, gait problem, joint swelling, myalgias, neck pain and neck stiffness.  Skin: Negative.   Allergic/Immunologic: Negative.   Neurological: Positive for numbness. Negative for dizziness, tremors, seizures, syncope, facial asymmetry, speech difficulty, weakness, light-headedness and headaches.  Hematological: Negative.   Psychiatric/Behavioral: Positive for dysphoric mood. Negative for agitation, behavioral problems, confusion, decreased concentration, hallucinations, self-injury, sleep disturbance and suicidal ideas. The patient is nervous/anxious. The patient is not hyperactive.    Per HPI unless specifically indicated above     Objective:    BP (!) 131/58 (BP Location: Right Arm, Patient Position: Sitting, Cuff Size: Normal)   Pulse 99   Temp 98.2 F (36.8 C) (Oral)   Ht '5\' 3"'  (1.6 m)  Wt 120 lb 12.8 oz (54.8 kg)   BMI 21.40 kg/m   Wt Readings from Last 3 Encounters:  09/01/20 120 lb 12.8 oz (54.8 kg)  07/05/20 115 lb 9.6 oz (52.4 kg)  07/03/20 113 lb 1.5 oz (51.3 kg)    Physical Exam Vitals reviewed.  Constitutional:      General: She is not in acute distress.    Appearance: Normal appearance. She is well-developed, well-groomed and normal weight. She is not ill-appearing or toxic-appearing.  HENT:     Head: Normocephalic and atraumatic.     Nose:      Comments: [name] is in place, covering mouth and nose. Eyes:     General: Lids are normal. Vision grossly intact.        Right eye: No discharge.        Left eye: No discharge.     Extraocular Movements: Extraocular movements intact.     Conjunctiva/sclera: Conjunctivae normal.     Pupils: Pupils are equal, round, and reactive to light.  Cardiovascular:     Rate and Rhythm: Normal rate and regular rhythm.     Pulses: Normal pulses.     Heart sounds: Normal heart sounds. No murmur heard.  No friction rub. No gallop.   Pulmonary:     Effort: Pulmonary effort is normal. No respiratory distress.     Breath sounds: Normal breath sounds.  Musculoskeletal:        General: No tenderness.     Right lower leg: No edema.     Left lower leg: No edema.  Skin:    General: Skin is warm and dry.     Capillary Refill: Capillary refill takes less than 2 seconds.  Neurological:     General: No focal deficit present.     Mental Status: She is alert and oriented to person, place, and time.  Psychiatric:        Attention and Perception: Attention and perception normal.        Mood and Affect: Affect normal. Mood is anxious and depressed.        Speech: Speech normal.        Behavior: Behavior normal. Behavior is cooperative.        Thought Content: Thought content normal.        Cognition and Memory: Cognition and memory normal.        Judgment: Judgment normal.    Results for orders placed or performed during the hospital encounter of 42/35/36  Basic metabolic panel  Result Value Ref Range   Sodium 134 (L) 135 - 145 mmol/L   Potassium 3.8 3.5 - 5.1 mmol/L   Chloride 103 98 - 111 mmol/L   CO2 24 22 - 32 mmol/L   Glucose, Bld 94 70 - 99 mg/dL   BUN 9 6 - 20 mg/dL   Creatinine, Ser 0.70 0.44 - 1.00 mg/dL   Calcium 9.2 8.9 - 10.3 mg/dL   GFR calc non Af Amer >60 >60 mL/min   GFR calc Af Amer >60 >60 mL/min   Anion gap 7 5 - 15  CBC  Result Value Ref Range   WBC 7.3 4.0 - 10.5 K/uL   RBC  4.50 3.87 - 5.11 MIL/uL   Hemoglobin 12.7 12.0 - 15.0 g/dL   HCT 37.4 36 - 46 %   MCV 83.1 80.0 - 100.0 fL   MCH 28.2 26.0 - 34.0 pg   MCHC 34.0 30.0 - 36.0 g/dL   RDW 13.7 11.5 -  15.5 %   Platelets 273 150 - 400 K/uL   nRBC 0.0 0.0 - 0.2 %  Urinalysis, Complete w Microscopic  Result Value Ref Range   Color, Urine STRAW (A) YELLOW   APPearance CLEAR (A) CLEAR   Specific Gravity, Urine 1.008 1.005 - 1.030   pH 5.0 5.0 - 8.0   Glucose, UA NEGATIVE NEGATIVE mg/dL   Hgb urine dipstick NEGATIVE NEGATIVE   Bilirubin Urine NEGATIVE NEGATIVE   Ketones, ur NEGATIVE NEGATIVE mg/dL   Protein, ur NEGATIVE NEGATIVE mg/dL   Nitrite NEGATIVE NEGATIVE   Leukocytes,Ua NEGATIVE NEGATIVE   RBC / HPF 0-5 0 - 5 RBC/hpf   WBC, UA 0-5 0 - 5 WBC/hpf   Bacteria, UA NONE SEEN NONE SEEN   Squamous Epithelial / LPF 0-5 0 - 5   Mucus PRESENT   Pregnancy, urine  Result Value Ref Range   Preg Test, Ur NEGATIVE NEGATIVE  Pregnancy, urine POC  Result Value Ref Range   Preg Test, Ur NEGATIVE NEGATIVE      Assessment & Plan:   Problem List Items Addressed This Visit      Cardiovascular and Mediastinum   Vasovagal syncope    Hx syncope, has met with neurology and is pending to have an EEG.  Referral placed to cardiology.  Discussed to increase water, sodium intake, and may find benefit from compression stockings.      Relevant Orders   Ambulatory referral to Cardiology     Other   Anxiety - Primary    PHQ9-8/GAD7-21.  Increased anxiety after having her syncope.  Reports is interfering with her activities of daily living.  Interested in beginning a daily medication to help with her anxiety instead of a PRN since it is more than a few days a week.  Discussed options like sertraline, fluoxetine, paroxetine and patient interested in starting on low dose fluoxetine.  Plan: 1. Mood handout given 2. Begin fluoxetine 63m daily 3. RTC in 2 weeks for medication re-evaluation, can be virtual       Relevant Medications   FLUoxetine (PROZAC) 10 MG tablet      Meds ordered this encounter  Medications  . FLUoxetine (PROZAC) 10 MG tablet    Sig: Take 1 tablet (10 mg total) by mouth daily.    Dispense:  30 tablet    Refill:  0   Follow up plan: Return in about 2 weeks (around 09/15/2020) for Medicine recheck (Can be virtually).  NHarlin Rain FCuyamungueFamily Nurse Practitioner [facility] 09/01/2020, 4:48 PM

## 2020-09-01 NOTE — Assessment & Plan Note (Signed)
Hx syncope, has met with neurology and is pending to have an EEG.  Referral placed to cardiology.  Discussed to increase water, sodium intake, and may find benefit from compression stockings.

## 2020-09-01 NOTE — Telephone Encounter (Signed)
Copied from [place] [id number]. Topic: General - Other >> Sep 01, 2020 10:31 AM Alanda Slim E wrote: Reason for CRM: Pt asked for a call about her anxiety and issues she had when she came in for her last appt. Derrek Monaco advise

## 2020-09-01 NOTE — Assessment & Plan Note (Signed)
PHQ9-8/GAD7-21.  Increased anxiety after having her syncope.  Reports is interfering with her activities of daily living.  Interested in beginning a daily medication to help with her anxiety instead of a PRN since it is more than a few days a week.  Discussed options like sertraline, fluoxetine, paroxetine and patient interested in starting on low dose fluoxetine.  Plan: 1. Mood handout given 2. Begin fluoxetine 10mg  daily 3. RTC in 2 weeks for medication re-evaluation, can be virtual

## 2020-09-07 ENCOUNTER — Ambulatory Visit: Payer: Self-pay | Admitting: *Deleted

## 2020-09-07 NOTE — Telephone Encounter (Signed)
Patient started new medication- patient states she was taking during day- feeling tired and dizzy. Patient switched to night- last night she had burning pain in neck and shoulder- she also had chest pain. Patient was up all night. Patient to hold medication and increase fluid intake today.- she will await response from office. Patient is doing better- no other SE- breathing fine, no rash, no swelling.  Reason for Disposition . [1] Caller has URGENT medicine question about med that PCP or specialist prescribed AND [2] triager unable to answer question  Answer Assessment - Initial Assessment Questions 1. NAME of MEDICATION: "What medicine are you calling about?"     Prozac 10 mg- generic 2. QUESTION: "What is your question?" (e.g., medication refill, side effect)     SE- tired,dizziness 3. PRESCRIBING HCP: "Who prescribed it?" Reason: if prescribed by specialist, call should be referred to that group.     PCP 4. SYMPTOMS: "Do you have any symptoms?"     Tired, dizziness, burning sensation in neck,shoulder chest 5. SEVERITY: If symptoms are present, ask "Are they mild, moderate or severe?"     Symptoms lasted 1 am- 8 am- some neck pain/sorenee- no burning 6. PREGNANCY:  "Is there any chance that you are pregnant?" "When was your last menstrual period?"     No- OCP,  LMP-2 weeks ago  Protocols used: MEDICATION QUESTION CALL-A-AH

## 2020-09-07 NOTE — Telephone Encounter (Signed)
Attempted to contact patient- she has been instructed to stop medication- she does have appointment scheduled 9/20- left message for her to call to see if that appointment can be moved up.

## 2020-09-07 NOTE — Telephone Encounter (Signed)
To stop medication, we can schedule a virtual to discuss other treatment options

## 2020-09-07 NOTE — Telephone Encounter (Signed)
Left message to schedule an appointment.

## 2020-09-08 NOTE — Telephone Encounter (Signed)
Pt. Reports she is "fine with my appointment for Monday."

## 2020-09-11 ENCOUNTER — Other Ambulatory Visit: Payer: Self-pay

## 2020-09-11 ENCOUNTER — Encounter: Payer: Self-pay | Admitting: Family Medicine

## 2020-09-11 ENCOUNTER — Ambulatory Visit (INDEPENDENT_AMBULATORY_CARE_PROVIDER_SITE_OTHER): Payer: Medicaid Other | Admitting: Family Medicine

## 2020-09-11 VITALS — BP 124/61 | HR 79 | Temp 98.2°F | Ht 63.0 in | Wt 120.2 lb

## 2020-09-11 DIAGNOSIS — F419 Anxiety disorder, unspecified: Secondary | ICD-10-CM | POA: Diagnosis not present

## 2020-09-11 DIAGNOSIS — R55 Syncope and collapse: Secondary | ICD-10-CM

## 2020-09-11 MED ORDER — HYDROXYZINE HCL 10 MG PO TABS: 10.0000 mg | ORAL_TABLET | Freq: Three times a day (TID) | ORAL | 1 refills | Status: DC | PRN

## 2020-09-11 NOTE — Assessment & Plan Note (Signed)
Has not had reoccurrence of syncope.  Has not scheduled appointment with cardiology as of yet, provided with [facility]'s contact phone number and encouraged to call and schedule today.  Plan: 1. Schedule appointment with [place]

## 2020-09-11 NOTE — Progress Notes (Signed)
Subjective:    Patient ID: April Fleming, female    DOB: 02-Aug-2001, 19 y.o.   MRN: [id number]  April Fleming is a 19 y.o. female presenting on 09/11/2020 for Anxiety (pt discontinued the Fluoxetine after a week because she developed some symptoms of chest discomfort, burning sensation in the neck, shoulders and chest.. She also notice some fatigue when she was taking it in the morning. she would like to discuss going on something different for her anxiet.)   HPI  [name] presents to clinic for follow up on her anxiety.  Reports that she had taken the fluoxetine for approximately 1 week and discontinued due to side effects of check discomfort with burning sensation in the neck, shoulders and chest with some daytime fatigue.  Has been on escitalopram and elavil in the past as well as hydroxyzine and alprazolam.  Is interested in a PRN medication for her anxiety instead of a daily medication.   Depression screen [facility] 2/9 09/11/2020 09/01/2020 05/18/2019  Decreased Interest 0 1 0  Down, Depressed, Hopeless 0 1 0  PHQ - 2 Score 0 2 0  Altered sleeping 1 2 -  Tired, decreased energy 1 2 -  Change in appetite 0 1 -  Feeling bad or failure about yourself  0 0 -  Trouble concentrating 0 0 -  Moving slowly or fidgety/restless 1 1 -  Suicidal thoughts 0 0 -  PHQ-9 Score 3 8 -  Difficult doing work/chores Not difficult at all Somewhat difficult -    Social History   Tobacco Use  . Smoking status: Never Smoker  . Smokeless tobacco: Current User  Vaping Use  . Vaping Use: Former  . Quit date: 05/12/2019  . Substances: Nicotine, Flavoring  . Devices: self-filled vaporizer  Substance Use Topics  . Alcohol use: Never  . Drug use: Never    Review of Systems  Constitutional: Negative.   HENT: Negative.   Eyes: Negative.   Respiratory: Negative.   Cardiovascular: Negative.   Gastrointestinal: Negative.   Endocrine: Negative.   Genitourinary: Negative.   Musculoskeletal:  Negative.   Skin: Negative.   Allergic/Immunologic: Negative.   Neurological: Negative.   Hematological: Negative.   Psychiatric/Behavioral: Negative for agitation, behavioral problems, confusion, decreased concentration, dysphoric mood, hallucinations, self-injury, sleep disturbance and suicidal ideas. The patient is nervous/anxious. The patient is not hyperactive.    Per HPI unless specifically indicated above     Objective:    BP 124/61 (BP Location: Left Arm, Patient Position: Sitting, Cuff Size: Normal)   Pulse 79   Temp 98.2 F (36.8 C) (Oral)   Ht 5\' 3"  (1.6 m)   Wt 120 lb 3.2 oz (54.5 kg)   BMI 21.29 kg/m   Wt Readings from Last 3 Encounters:  09/11/20 120 lb 3.2 oz (54.5 kg)  09/01/20 120 lb 12.8 oz (54.8 kg)  07/05/20 115 lb 9.6 oz (52.4 kg)    Physical Exam Vitals reviewed.  Constitutional:      General: She is not in acute distress.    Appearance: Normal appearance. She is well-developed, well-groomed and normal weight. She is not ill-appearing or toxic-appearing.  HENT:     Head: Normocephalic and atraumatic.     Nose:     Comments: [name] is in place, covering mouth and nose. Eyes:     General: Lids are normal. Vision grossly intact.        Right eye: No discharge.        Left  eye: No discharge.     Extraocular Movements: Extraocular movements intact.     Conjunctiva/sclera: Conjunctivae normal.     Pupils: Pupils are equal, round, and reactive to light.  Cardiovascular:     Rate and Rhythm: Normal rate and regular rhythm.     Pulses: Normal pulses.          Dorsalis pedis pulses are 2+ on the right side and 2+ on the left side.     Heart sounds: Normal heart sounds. No murmur heard.  No friction rub. No gallop.   Pulmonary:     Effort: Pulmonary effort is normal. No respiratory distress.     Breath sounds: Normal breath sounds.  Musculoskeletal:     Right lower leg: No edema.     Left lower leg: No edema.  Skin:    General: Skin is warm and dry.       Capillary Refill: Capillary refill takes less than 2 seconds.  Neurological:     General: No focal deficit present.     Mental Status: She is alert and oriented to person, place, and time.  Psychiatric:        Attention and Perception: Attention and perception normal.        Mood and Affect: Mood and affect normal.        Speech: Speech normal.        Behavior: Behavior normal. Behavior is cooperative.        Thought Content: Thought content normal.        Cognition and Memory: Cognition and memory normal.        Judgment: Judgment normal.    Results for orders placed or performed during the hospital encounter of 53/66/44  Basic metabolic panel  Result Value Ref Range   Sodium 134 (L) 135 - 145 mmol/L   Potassium 3.8 3.5 - 5.1 mmol/L   Chloride 103 98 - 111 mmol/L   CO2 24 22 - 32 mmol/L   Glucose, Bld 94 70 - 99 mg/dL   BUN 9 6 - 20 mg/dL   Creatinine, Ser 0.70 0.44 - 1.00 mg/dL   Calcium 9.2 8.9 - 10.3 mg/dL   GFR calc non Af Amer >60 >60 mL/min   GFR calc Af Amer >60 >60 mL/min   Anion gap 7 5 - 15  CBC  Result Value Ref Range   WBC 7.3 4.0 - 10.5 K/uL   RBC 4.50 3.87 - 5.11 MIL/uL   Hemoglobin 12.7 12.0 - 15.0 g/dL   HCT 37.4 36 - 46 %   MCV 83.1 80.0 - 100.0 fL   MCH 28.2 26.0 - 34.0 pg   MCHC 34.0 30.0 - 36.0 g/dL   RDW 13.7 11.5 - 15.5 %   Platelets 273 150 - 400 K/uL   nRBC 0.0 0.0 - 0.2 %  Urinalysis, Complete w Microscopic  Result Value Ref Range   Color, Urine STRAW (A) YELLOW   APPearance CLEAR (A) CLEAR   Specific Gravity, Urine 1.008 1.005 - 1.030   pH 5.0 5.0 - 8.0   Glucose, UA NEGATIVE NEGATIVE mg/dL   Hgb urine dipstick NEGATIVE NEGATIVE   Bilirubin Urine NEGATIVE NEGATIVE   Ketones, ur NEGATIVE NEGATIVE mg/dL   Protein, ur NEGATIVE NEGATIVE mg/dL   Nitrite NEGATIVE NEGATIVE   Leukocytes,Ua NEGATIVE NEGATIVE   RBC / HPF 0-5 0 - 5 RBC/hpf   WBC, UA 0-5 0 - 5 WBC/hpf   Bacteria, UA NONE SEEN NONE SEEN   Squamous Epithelial /  LPF 0-5 0 - 5    Mucus PRESENT   Pregnancy, urine  Result Value Ref Range   Preg Test, Ur NEGATIVE NEGATIVE  Pregnancy, urine POC  Result Value Ref Range   Preg Test, Ur NEGATIVE NEGATIVE      Assessment & Plan:   Problem List Items Addressed This Visit      Cardiovascular and Mediastinum   Vasovagal syncope    Has not had reoccurrence of syncope.  Has not scheduled appointment with cardiology as of yet, provided with [facility]'s contact phone number and encouraged to call and schedule today.  Plan: 1. Schedule appointment with [place]        Other   Anxiety - Primary    PHQ9-8/GAD7-16, improved from PHQ9-8/GAD7-21.  Had started on fluoxetine and then discontinued after 1 week due to side effects of chest discomfort, with burning sensation in her neck, shoulders and chest.  Reports that she was having some morning fatigue but did believe the medication was helping with her anxiety.  Is interested in starting a PRN medication to help with her anxiety.  Discussed Buspar vs hydroxyzine, has tolerated hydroxyzine in the past and requesting to restart on this.  Plan: 1. Begin hydroxyzine 10mg  TID PRN for anxiety 2. Review mood handout 3. RTC in 4 weeks for anxiety follow up visit      Relevant Medications   hydrOXYzine (ATARAX/VISTARIL) 10 MG tablet      Meds ordered this encounter  Medications  . hydrOXYzine (ATARAX/VISTARIL) 10 MG tablet    Sig: Take 1 tablet (10 mg total) by mouth 3 (three) times daily as needed for anxiety.    Dispense:  90 tablet    Refill:  1   Follow up plan: Return in about 4 weeks (around 10/09/2020) for Anxiety Follow Up (Can be virtual).   Harlin Rain, [place] Family Nurse Practitioner [facility] 09/11/2020, 9:33 AM

## 2020-09-11 NOTE — Assessment & Plan Note (Signed)
PHQ9-8/GAD7-16, improved from PHQ9-8/GAD7-21.  Had started on fluoxetine and then discontinued after 1 week due to side effects of chest discomfort, with burning sensation in her neck, shoulders and chest.  Reports that she was having some morning fatigue but did believe the medication was helping with her anxiety.  Is interested in starting a PRN medication to help with her anxiety.  Discussed Buspar vs hydroxyzine, has tolerated hydroxyzine in the past and requesting to restart on this.  Plan: 1. Begin hydroxyzine 10mg  TID PRN for anxiety 2. Review mood handout 3. RTC in 4 weeks for anxiety follow up visit

## 2020-09-11 NOTE — Patient Instructions (Signed)
STOP the fluoxetine.  Can begin hydroxyzine 25mg  up to 3x per day as needed for anxiety.  Be sure not to drive or operate heavy machinery while taking this medication, as it may be sedating.  Contact [facility] to schedule your cardiology appointment  We will plan to see you back in 4 weeks for anxiety follow up visit  You will receive a survey after today's visit either digitally by e-mail or paper by [place] mail. Your experiences and feedback matter to [place].  Please respond so we know how we are doing as we provide care for you.  Call us with any questions/concerns/needs.  It is my goal to be available to you for your health concerns.  Thanks for choosing me to be a partner in your healthcare needs!  Harlin Rain, FNP-C Family Nurse Practitioner [facility] Phone: [phone]

## 2020-09-13 ENCOUNTER — Other Ambulatory Visit: Payer: Self-pay

## 2020-09-13 ENCOUNTER — Encounter: Payer: Self-pay | Admitting: Cardiology

## 2020-09-13 ENCOUNTER — Ambulatory Visit (INDEPENDENT_AMBULATORY_CARE_PROVIDER_SITE_OTHER): Payer: Medicaid Other | Admitting: Cardiology

## 2020-09-13 VITALS — Ht 64.0 in | Wt 119.8 lb

## 2020-09-13 DIAGNOSIS — R55 Syncope and collapse: Secondary | ICD-10-CM

## 2020-09-13 DIAGNOSIS — Q211 Atrial septal defect, unspecified: Secondary | ICD-10-CM

## 2020-09-13 NOTE — Progress Notes (Signed)
Electrophysiology Office Note:    Date:  09/13/2020   ID:  April Fleming, DOB 2001/07/16, MRN [id number]  PCP:  April Fleming, [place] Cardiologist:  No primary care provider on file.  [facility] Electrophysiologist:  None   Referring MD: April Bangs, FNP   Chief Complaint: Syncope  History of Present Illness:    April Fleming is a 19 y.o. female who presents for an evaluation of syncope at the request of April Herb, NP. Their medical history includes anxiety. She had an episode of syncope in July while at work. She tells me that she became hot and flushed and then passed out. She struck her head and was evaluated in the emergency room where she was diagnosed with a concussion. She tells me that she has had similar episodes in the past all preceded by a prodrome of lightheadedness. She has caught herself "locking" her knees prior to episodes. She has recently purchased compression stockings which seem to have helped reduce the symptoms. She has been told in the past she has a murmur. Normal childhood, no limitations with exertion. She hunts and fishes without problems.    Past Medical History:  Diagnosis Date  . Abdominal pain   . Vomiting     History reviewed. No pertinent surgical history.  Current Medications: Current Meds  Medication Sig  . aspirin-acetaminophen-caffeine (EXCEDRIN MIGRAINE) 250-250-65 MG tablet Take by mouth every 6 (six) hours as needed for headache.  . hydrOXYzine (ATARAX/VISTARIL) 10 MG tablet Take 1 tablet (10 mg total) by mouth 3 (three) times daily as needed for anxiety.  . LOW-OGESTREL 0.3-30 MG-MCG tablet TAKE 1 TABLET BY MOUTH ONCE DAILY  . Multiple Vitamins-Calcium (ONE-A-DAY WOMENS PO) Take by mouth.  . Peppermint Oil 90 MG CPCR Take 1 capsule by mouth 3 (three) times daily as needed.     Allergies:   Fluoxetine   Social History   Socioeconomic History  . Marital status: Single    Spouse name: Not on file  .  Number of children: Not on file  . Years of education: Not on file  . Highest education level: High school graduate  Occupational History  . Not on file  Tobacco Use  . Smoking status: Never Smoker  . Smokeless tobacco: Current User  Vaping Use  . Vaping Use: Former  . Quit date: 05/12/2019  . Substances: Nicotine, Flavoring  . Devices: self-filled vaporizer  Substance and Sexual Activity  . Alcohol use: Never  . Drug use: Never  . Sexual activity: Yes    Birth control/protection: Pill  Other Topics Concern  . Not on file  Social History Narrative  . Not on file   Social Determinants of Health   Financial Resource Strain:   . Difficulty of Paying Living Expenses: Not on file  Food Insecurity:   . Worried About Charity fundraiser in the Last Year: Not on file  . Ran Out of Food in the Last Year: Not on file  Transportation Needs:   . Lack of Transportation (Medical): Not on file  . Lack of Transportation (Non-Medical): Not on file  Physical Activity:   . Days of Exercise per Week: Not on file  . Minutes of Exercise per Session: Not on file  Stress:   . Feeling of Stress : Not on file  Social Connections:   . Frequency of Communication with Friends and Family: Not on file  . Frequency of Social Gatherings with Friends and Family:  Not on file  . Attends Religious Services: Not on file  . Active Member of Clubs or Organizations: Not on file  . Attends Archivist Meetings: Not on file  . Marital Status: Not on file     Family History: The patient's family history includes COPD in her mother; Dementia in her paternal grandmother; Healthy in her maternal grandfather and other family members; Melanoma in her father and maternal grandmother.  ROS:   Please see the history of present illness.    All other systems reviewed and are negative.  EKGs/Labs/Other Studies Reviewed:    The following studies were reviewed today: ECG  07/04/2020 ECG personally reviewed:  incomplete RBBB. Borderline Right axis deviation. No preexcitation.  EKG:  The ekg ordered today demonstrates incomplete RBBB. Right axis. No preexcitation.  Recent Labs: 07/03/2020: BUN 9; Creatinine, Ser 0.70; Hemoglobin 12.7; Platelets 273; Potassium 3.8; Sodium 134  Recent Lipid Panel No results found for: CHOL, TRIG, HDL, CHOLHDL, VLDL, LDLCALC, LDLDIRECT  Physical Exam:    VS:  Ht 5\' 4"  (1.626 m)   Wt 119 lb 12.8 oz (54.3 kg)   SpO2 100%   BMI 20.56 kg/m     Wt Readings from Last 3 Encounters:  09/13/20 119 lb 12.8 oz (54.3 kg)  09/11/20 120 lb 3.2 oz (54.5 kg)  09/01/20 120 lb 12.8 oz (54.8 kg)    Flat 124/84 72 Sitting 129/86 74 Standing 144/92 76 Standing 3 min 135/97 81  GEN:  Well nourished, well developed in no acute distress HEENT: Normal NECK: No JVD; No carotid bruits LYMPHATICS: No lymphadenopathy CARDIAC: RRR, no rubs or gallops. II/VI murmur loudest at the upper sternal borders. It is faint and has some respiratory variation. RESPIRATORY:  Clear to auscultation without rales, wheezing or rhonchi  ABDOMEN: Soft, non-tender, non-distended MUSCULOSKELETAL:  No edema; No deformity  SKIN: Warm and dry NEUROLOGIC:  Alert and oriented x 3 PSYCHIATRIC:  Normal affect   ASSESSMENT:    1. ASD (atrial septal defect)   2. Vasovagal syncope    PLAN:    In order of problems listed above:  1. Vasovagal syncope. Her syncopal episodes are consistent with vasovagal syncope. Her orthostatic vitals today are not suggestive of orthostatic intolerance. I discussed the disease process in depth today including management strategies. We discussed taking the warning signs seriously and getting to the ground if she feels an episode's early signs. We discussed compression stockings and staying hydrated and avoiding triggers.  2. Heart murmur and abnormal ECG ECG shows incomplete RBBB and borderline right bundle branch block. Her exam is significant for a faint murmur. These  findings taken together are possibly due to an ASD (secundum). Will order a transthoracic echo with bubble study to further interrogate. No evidence of RH failure on exam.    Medication Adjustments/Labs and Tests Ordered: Current medicines are reviewed at length with the patient today.  Concerns regarding medicines are outlined above.  Orders Placed This Encounter  Procedures  . EKG 12-Lead  . ECHOCARDIOGRAM COMPLETE BUBBLE STUDY   No orders of the defined types were placed in this encounter.    Signed, Lars Mage, MD, [facility]  09/13/2020 12:07 PM    Electrophysiology [place]

## 2020-09-13 NOTE — Patient Instructions (Signed)
Medication Instructions:  Your physician recommends that you continue on your current medications as directed. Please refer to the Current Medication list given to you today.  *If you need a refill on your cardiac medications before your next appointment, please call your pharmacy*   Lab Work: None ordered   Testing/Procedures: Your physician has requested that you have an echocardiogram. Echocardiography is a painless test that uses sound waves to create images of your heart. It provides your doctor with information about the size and shape of your heart and how well your heart's chambers and valves are working. This procedure takes approximately one hour. There are no restrictions for this procedure.   Follow-Up: At [facility], you and your health needs are our priority.  As part of our continuing mission to provide you with exceptional heart care, we have created designated Provider Care Teams.  These Care Teams include your primary Cardiologist (physician) and Advanced Practice Providers (APPs -  Physician Assistants and Nurse Practitioners) who all work together to provide you with the care you need, when you need it.  We recommend signing up for the patient portal called "MyChart".  Sign up information is provided on this After Visit Summary.  MyChart is used to connect with patients for Virtual Visits (Telemedicine).  Patients are able to view lab/test results, encounter notes, upcoming appointments, etc.  Non-urgent messages can be sent to your provider as well.   To learn more about what you can do with MyChart, go to [email or web address].    Your next appointment:   10/04/20 @ 9:20 am  The format for your next appointment:   In Person  Provider:   Lars Mage, MD   Other Instructions

## 2020-09-19 ENCOUNTER — Telehealth: Payer: Medicaid Other | Admitting: Family Medicine

## 2020-09-21 ENCOUNTER — Encounter: Payer: Self-pay | Admitting: Family Medicine

## 2020-09-22 NOTE — Telephone Encounter (Signed)
Appt scheduled for Monday, Oct 4th.

## 2020-09-25 ENCOUNTER — Other Ambulatory Visit: Payer: Self-pay

## 2020-09-25 ENCOUNTER — Telehealth (INDEPENDENT_AMBULATORY_CARE_PROVIDER_SITE_OTHER): Payer: Medicaid Other | Admitting: Family Medicine

## 2020-09-25 ENCOUNTER — Encounter: Payer: Self-pay | Admitting: Family Medicine

## 2020-09-25 DIAGNOSIS — F419 Anxiety disorder, unspecified: Secondary | ICD-10-CM

## 2020-09-25 MED ORDER — HYDROXYZINE HCL 10 MG PO TABS: 5.0000 mg | ORAL_TABLET | Freq: Three times a day (TID) | ORAL | 1 refills | Status: DC | PRN

## 2020-09-25 NOTE — Progress Notes (Signed)
Virtual Visit via Telephone  The purpose of this virtual visit is to provide medical care while limiting exposure to the novel coronavirus (COVID19) for both patient and office staff.  Consent was obtained for phone visit:  Yes.   Answered questions that patient had about telehealth interaction:  Yes.   I discussed the limitations, risks, security and privacy concerns of performing an evaluation and management service by telephone. I also discussed with the patient that there may be a patient responsible charge related to this service. The patient expressed understanding and agreed to proceed.  Patient is at home and is accessed via telephone Services are provided by Harlin Rain, FNP-C from [facility])  ---------------------------------------------------------------------- Chief Complaint  Patient presents with  . Anxiety    S: Reviewed CMA documentation. I have called patient and gathered additional HPI as follows:  [name] presents for telemedicine virtual visit via telephone.  Reports she has been having increased anxiety and is unsure if she needs something that is daily.  Had taken a 10mg  of hydroxyzine and felt that she was unable to safely drive home, so had her family come pick her up from work.  Has taken escitalopram in the past, unsure how she tolerated this.  Reports having an echocardiogram scheduled for tomorrow and she believes that once she has the echocardiogram done, that her anxiety may calm down.  Patient is currently home Denies any high risk travel to areas of current concern for COVID19. Denies any known or suspected exposure to person with or possibly with COVID19.  Past Medical History:  Diagnosis Date  . Abdominal pain   . Vomiting    Social History   Tobacco Use  . Smoking status: Never Smoker  . Smokeless tobacco: Current User  Vaping Use  . Vaping Use: Former  . Quit date: 05/12/2019  . Substances: Nicotine,  Flavoring  . Devices: self-filled vaporizer  Substance Use Topics  . Alcohol use: Never  . Drug use: Never    Current Outpatient Medications:  .  acetaminophen (TYLENOL) 500 MG tablet, Take 500 mg by mouth every 6 (six) hours as needed., Disp: , Rfl:  .  aspirin-acetaminophen-caffeine (EXCEDRIN MIGRAINE) 250-250-65 MG tablet, Take by mouth every 6 (six) hours as needed for headache., Disp: , Rfl:  .  dicyclomine (BENTYL) 10 MG capsule, Take 10 mg by mouth in the morning, at noon, in the evening, and at bedtime., Disp: , Rfl:  .  hydrOXYzine (ATARAX/VISTARIL) 10 MG tablet, Take 0.5 tablets (5 mg total) by mouth 3 (three) times daily as needed for anxiety., Disp: 90 tablet, Rfl: 1 .  LOW-OGESTREL 0.3-30 MG-MCG tablet, TAKE 1 TABLET BY MOUTH ONCE DAILY, Disp: 84 tablet, Rfl: 0 .  Multiple Vitamins-Calcium (ONE-A-DAY WOMENS PO), Take by mouth., Disp: , Rfl:  .  Peppermint Oil 90 MG CPCR, Take 1 capsule by mouth 3 (three) times daily as needed., Disp: , Rfl:   Depression screen [facility] 2/9 09/11/2020 09/01/2020 05/18/2019  Decreased Interest 0 1 0  Down, Depressed, Hopeless 0 1 0  PHQ - 2 Score 0 2 0  Altered sleeping 1 2 -  Tired, decreased energy 1 2 -  Change in appetite 0 1 -  Feeling bad or failure about yourself  0 0 -  Trouble concentrating 0 0 -  Moving slowly or fidgety/restless 1 1 -  Suicidal thoughts 0 0 -  PHQ-9 Score 3 8 -  Difficult doing work/chores Not difficult at all Somewhat  difficult -    GAD 7 : Generalized Anxiety Score 09/11/2020 09/01/2020 09/17/2018  Nervous, Anxious, on Edge 2 3 3   Control/stop worrying 3 3 3   Worry too much - different things 3 3 3   Trouble relaxing 2 3 1   Restless 1 3 0  Easily annoyed or irritable 2 3 3   Afraid - awful might happen 3 3 3   Total GAD 7 Score 16 21 16   Anxiety Difficulty Somewhat difficult Very difficult Somewhat difficult    -------------------------------------------------------------------------- O: No physical exam  performed due to remote telephone encounter.  Physical Exam: Patient remotely monitored without video.  Verbal communication appropriate.  Cognition normal.  Recent Results (from the past 2160 hour(s))  Basic metabolic panel     Status: Abnormal   Collection Time: 07/03/20 11:19 AM  Result Value Ref Range   Sodium 134 (L) 135 - 145 mmol/L   Potassium 3.8 3.5 - 5.1 mmol/L   Chloride 103 98 - 111 mmol/L   CO2 24 22 - 32 mmol/L   Glucose, Bld 94 70 - 99 mg/dL    Comment: Glucose reference range applies only to samples taken after fasting for at least 8 hours.   BUN 9 6 - 20 mg/dL   Creatinine, Ser 0.70 0.44 - 1.00 mg/dL   Calcium 9.2 8.9 - 10.3 mg/dL   GFR calc non Af Amer >60 >60 mL/min   GFR calc Af Amer >60 >60 mL/min   Anion gap 7 5 - 15    Comment: Performed at [facility], [address] [place], [place] [address]  CBC     Status: None   Collection Time: 07/03/20 11:19 AM  Result Value Ref Range   WBC 7.3 4.0 - 10.5 K/uL   RBC 4.50 3.87 - 5.11 MIL/uL   Hemoglobin 12.7 12.0 - 15.0 g/dL   HCT 37.4 36 - 46 %   MCV 83.1 80.0 - 100.0 fL   MCH 28.2 26.0 - 34.0 pg   MCHC 34.0 30.0 - 36.0 g/dL   RDW 13.7 11.5 - 15.5 %   Platelets 273 150 - 400 K/uL   nRBC 0.0 0.0 - 0.2 %    Comment: Performed at [facility], [address] [place], [place] [address]  Urinalysis, Complete w Microscopic     Status: Abnormal   Collection Time: 07/03/20 11:19 AM  Result Value Ref Range   Color, Urine STRAW (A) YELLOW   APPearance CLEAR (A) CLEAR   Specific Gravity, Urine 1.008 1.005 - 1.030   pH 5.0 5.0 - 8.0   Glucose, UA NEGATIVE NEGATIVE mg/dL   Hgb urine dipstick NEGATIVE NEGATIVE   Bilirubin Urine NEGATIVE NEGATIVE   Ketones, ur NEGATIVE NEGATIVE mg/dL   Protein, ur NEGATIVE NEGATIVE mg/dL   Nitrite NEGATIVE NEGATIVE   Leukocytes,Ua NEGATIVE NEGATIVE   RBC / HPF 0-5 0 - 5 RBC/hpf   WBC, UA 0-5 0 - 5 WBC/hpf   Bacteria, UA NONE SEEN NONE SEEN   Squamous  Epithelial / LPF 0-5 0 - 5   Mucus PRESENT     Comment: Performed at [facility], [address] [place], [place] [address]  Pregnancy, urine     Status: None   Collection Time: 07/03/20 11:19 AM  Result Value Ref Range   Preg Test, Ur NEGATIVE NEGATIVE    Comment: Performed at [facility], [address] [place], [place] [address]  Pregnancy, urine POC     Status: None   Collection  Time: 07/03/20  1:02 PM  Result Value Ref Range   Preg Test, Ur NEGATIVE NEGATIVE    Comment:        THE SENSITIVITY OF THIS METHODOLOGY IS >24 mIU/mL     -------------------------------------------------------------------------- A&P:  Problem List Items Addressed This Visit      Other   Anxiety - Primary    Has taken hydroxyzine 10mg  once and found it made her drowsy, discussed trying to take 1/2 tablet (5mg ) when she is home to see how this may effect her.  Discussed daily escitalopram vs citalopram, as she had an allergy to fluoxetine.  Discussed could take for a short course, as we see patients with PMDD take this for a few days before their cycle and then discontinue.  Is not interested in a medication that she needs to take daily, would rather take something PRN and if she takes it for a few days in a row that would be ok as well.  Encouraged reduced dosing of hydroxyzine and re-evaluation.  Reports will contact the office and let me know if she decides she wants to try escitalopram or citalopram.  Plan: 1. REDUCE hydroxyzine from 10mg  to 5mg  TID PRN 2. To let me know if she would like to start on escitalopram or citalopram for anxiety 3. RTC PRN      Relevant Medications   hydrOXYzine (ATARAX/VISTARIL) 10 MG tablet      Meds ordered this encounter  Medications  . hydrOXYzine (ATARAX/VISTARIL) 10 MG tablet    Sig: Take 0.5 tablets (5 mg total) by mouth 3 (three) times daily as needed for anxiety.    Dispense:  90 tablet    Refill:  1    Follow-up: - Return to  clinic as needed  Patient verbalizes understanding with the above medical recommendations including the limitation of remote medical advice.  Specific follow-up and call-back criteria were given for patient to follow-up or seek medical care more urgently if needed.  - Time spent in direct consultation with patient on phone: 6 minutes  Harlin Rain, [facility] 09/25/2020, 8:51 AM

## 2020-09-25 NOTE — Assessment & Plan Note (Signed)
Has taken hydroxyzine 10mg  once and found it made her drowsy, discussed trying to take 1/2 tablet (5mg ) when she is home to see how this may effect her.  Discussed daily escitalopram vs citalopram, as she had an allergy to fluoxetine.  Discussed could take for a short course, as we see patients with PMDD take this for a few days before their cycle and then discontinue.  Is not interested in a medication that she needs to take daily, would rather take something PRN and if she takes it for a few days in a row that would be ok as well.  Encouraged reduced dosing of hydroxyzine and re-evaluation.  Reports will contact the office and let me know if she decides she wants to try escitalopram or citalopram.  Plan: 1. REDUCE hydroxyzine from 10mg  to 5mg  TID PRN 2. To let me know if she would like to start on escitalopram or citalopram for anxiety 3. RTC PRN

## 2020-09-25 NOTE — Patient Instructions (Signed)
As we discussed, decrease the hydroxyzine from 10mg  up to 3x per day to 5mg  (1/2 tablet) up to 3x per day.  We can start you on escitalopram or citalopram for concerns of anxiety, think about this and let me know if you'd like me to send in a trial of either medication.  The following recommendations are helpful adjuncts for helping rebalance your mood.  Eat a nourishing diet. Ensure adequate intake of calories, protein, carbs, fat, vitamins, and minerals. Prioritize whole foods at each meal, including meats, vegetables, fruits, nuts and seeds, etc.   Avoid inflammatory and/or "junk" foods, such as sugar, omega-6 fats, refined grains, chemicals, and preservatives are common in packaged and prepared foods. Minimize or completely avoid these ingredients and stick to whole foods with little to no additives. Cook from scratch as much as possible for more control over what you eat  Get enough sleep. Poor sleep is significantly associated with depression and anxiety. Make 7-9 hours of sleep nightly a top priority  Exercise appropriately. Exercise is known to improve brain functioning and boost mood. Aim for 30 minutes of daily physical activity. Avoid "overtraining," which can cause mental disturbances  Assess your light exposure. Not enough natural light during the day and too much artificial light can have a major impact on your mood. Get outside as often as possible during daylight hours. Minimize light exposure after dark and avoid the use of electronics that give off blue light before bed  Manage your stress.  Use daily stress management techniques such as meditation, yoga, or mindfulness to retrain your brain to respond differently to stress. Try deep breathing to deactivate your "fight or flight" response.  There are many of sources with apps like Headspace, Calm or a variety of YouTube videos (videos from Chrysten Woulfe have guided meditation)  Prioritize your social life. Work on building  social support with new friends or improve current relationships. Consider getting a pet that allows for companionship, social interaction, and physical touch. Try volunteering or joining a faith-based community to increase your sense of purpose  4-7-8 breathing technique at bedtime: breathe in to count of 4, hold breath for count of 7, exhale for count of 8; do 3-5 times for letting go of overactive thoughts  Take time to play Unstructured "play" time can help reduce anxiety and depression Options for play include music, games, sports, dance, art, etc.  Try to add daily omega 3 fatty acids, magnesium, B complex, and balanced amino acid supplements to help improve mood and anxiety.  We will plan to see you back if your symptoms worsen or fail to improve  You will receive a survey after today's visit either digitally by e-mail or paper by USPS mail. Your experiences and feedback matter to [place].  Please respond so we know how we are doing as we provide care for you.  Call us with any questions/concerns/needs.  It is my goal to be available to you for your health concerns.  Thanks for choosing me to be a partner in your healthcare needs!  Harlin Rain, FNP-C Family Nurse Practitioner [facility] Phone: [phone]

## 2020-09-26 ENCOUNTER — Other Ambulatory Visit: Payer: Self-pay | Admitting: Cardiology

## 2020-09-26 ENCOUNTER — Ambulatory Visit (INDEPENDENT_AMBULATORY_CARE_PROVIDER_SITE_OTHER): Payer: Medicaid Other

## 2020-09-26 DIAGNOSIS — Q211 Atrial septal defect, unspecified: Secondary | ICD-10-CM

## 2020-09-26 LAB — ECHOCARDIOGRAM COMPLETE
AR max vel: 2.28 cm2
AV Area VTI: 2.13 cm2
AV Area mean vel: 2.31 cm2
AV Mean grad: 4 mmHg
AV Peak grad: 6.4 mmHg
Ao pk vel: 1.26 m/s
Area-P 1/2: 4.71 cm2
Calc EF: 58 %
S' Lateral: 2.5 cm
Single Plane A2C EF: 59.7 %
Single Plane A4C EF: 58 %

## 2020-09-28 ENCOUNTER — Telehealth: Payer: Self-pay | Admitting: Cardiology

## 2020-09-28 NOTE — Telephone Encounter (Signed)
Patient calling to discuss recent testing results  ° °Please call  ° °

## 2020-09-28 NOTE — Telephone Encounter (Signed)
Sent mychart message advising echo results not reviewed yet.  Will call when completed.

## 2020-09-29 ENCOUNTER — Other Ambulatory Visit: Payer: Self-pay | Admitting: Cardiology

## 2020-09-29 DIAGNOSIS — Q211 Atrial septal defect, unspecified: Secondary | ICD-10-CM

## 2020-09-29 NOTE — Telephone Encounter (Signed)
Please call with echocardiogram results. Patient has some questions.

## 2020-10-03 ENCOUNTER — Other Ambulatory Visit: Payer: Self-pay

## 2020-10-03 ENCOUNTER — Ambulatory Visit (INDEPENDENT_AMBULATORY_CARE_PROVIDER_SITE_OTHER): Payer: Medicaid Other

## 2020-10-03 DIAGNOSIS — Q211 Atrial septal defect, unspecified: Secondary | ICD-10-CM

## 2020-10-04 ENCOUNTER — Encounter: Payer: Self-pay | Admitting: Cardiology

## 2020-10-04 ENCOUNTER — Other Ambulatory Visit: Payer: Self-pay | Admitting: Family Medicine

## 2020-10-04 ENCOUNTER — Ambulatory Visit (INDEPENDENT_AMBULATORY_CARE_PROVIDER_SITE_OTHER): Payer: Medicaid Other | Admitting: Cardiology

## 2020-10-04 VITALS — BP 120/80 | HR 80 | Ht 64.0 in | Wt 122.0 lb

## 2020-10-04 DIAGNOSIS — Q211 Atrial septal defect, unspecified: Secondary | ICD-10-CM

## 2020-10-04 DIAGNOSIS — Z3041 Encounter for surveillance of contraceptive pills: Secondary | ICD-10-CM

## 2020-10-04 MED ORDER — LOW-OGESTREL 0.3-30 MG-MCG PO TABS: 1.0000 | ORAL_TABLET | Freq: Every day | ORAL | 0 refills | Status: DC

## 2020-10-04 NOTE — Patient Instructions (Addendum)
Medication Instructions:  Your physician recommends that you continue on your current medications as directed. Please refer to the Current Medication list given to you today. *If you need a refill on your cardiac medications before your next appointment, please call your pharmacy*  Lab Work: None ordered.  Testing/Procedures: Your physician has requested that you have a cardiac MRI. Cardiac MRI uses a computer to create images of your heart as its beating, producing both still and moving pictures of your heart and major blood vessels.   You will be scheduled for a cardiac MRI.  You will receive a phone call from [facility] hospital to schedule this test after it has been approved by your insurance.  Follow-Up: At [facility], you and your health needs are our priority.  As part of our continuing mission to provide you with exceptional heart care, we have created designated Provider Care Teams.  These Care Teams include your primary Cardiologist (physician) and Advanced Practice Providers (APPs -  Physician Assistants and Nurse Practitioners) who all work together to provide you with the care you need, when you need it.  Your next appointment:   Your physician wants you to follow-up in: based on results of MRI   Magnetic Resonance Imaging Magnetic resonance imaging (MRI) is a painless test that produces images of the inside of the body without using X-rays. During an MRI, strong magnets and radio waves work together in a [employer] officer, political party to form detailed images. MRI images may provide more details about a medical condition than X-rays, CT scans, and ultrasounds can provide. For a standard MRI, you will lie on a platform that slides into a tunnel. The tunnel contains magnets that scan your body. If you have an open MRI, the tunnel will be open at the sides. In some cases, dye (contrast material or contrast dye) may be injected into your bloodstream to make the MRI images even clearer. Tell a  health care provider about:  Any surgeries you have had.  Any medical conditions you have.  Any metal you may have in your body. The magnet used in MRI can cause metal objects in your body to move. Metal can also make it hard to get high-quality images. Objects that may contain metal include: ? Any joint replacement (prosthesis), such as an artificial knee or hip. ? An implanted defibrillator, pacemaker, or neurostimulator. ? A metallic ear implant (cochlear implant). ? An artificial heart valve. ? A metallic object in the eye. ? Metal splinters. ? Bullet fragments. ? A port for delivering insulin or chemotherapy.  Any tattoos. Some of the darker inks can cause problems with testing.  Whether you are pregnant, may be pregnant, or are breastfeeding.  Any fear of cramped spaces (claustrophobia). If this is a problem, it usually can be managed with medicines given prior to the MRI.  Any allergies you have.  All medicines you are taking, including vitamins, herbs, eye drops, creams, and over-the-counter medicines. What are the risks? Generally, this is a safe test. However, problems can occur:  If you have metal in your body, it may be affected by the magnet used during the test. If you have a metallic implant close to the area being tested, it may be hard to get high-quality images.  If you are pregnant, you should avoid MRI tests during the first three months of pregnancy. MRI may have effects on an unborn baby.  If you are breastfeeding and contrast material will be used during your test, you  may need to stop breastfeeding temporarily. Your breast milk may contain contrast material until the material naturally leaves your body. What happens before the procedure?  You will be asked to remove all metal, including: ? Your watch, jewelry, and other metal objects. ? Hearing aids. ? Dentures. ? Underwire bra. ? Makeup. Certain kinds of makeup contain small amounts of metal. ? Braces  and fillings normally are not a problem.  If you are breastfeeding, ask your health care provider if you need to pump before your test and stop breastfeeding temporarily. You may need to do this if contrast material will be used. What happens during the procedure?   You may be given earplugs or headphones to listen to music. The MRI machine can be noisy.  You will lie down on a platform, similar to a long table.  If a contrast material will be used, an IV will be inserted into one of your veins. Contrast material will be injected into your IV at a certain time as images are taken.  The platform will slide into a tunnel that has magnets inside of it. When you are inside the tunnel, you will still be able to talk to your health care provider.  You will be asked to lie very still while images are taken. Your health care provider will tell you when you can move. You may have to wait a few minutes to make sure that the images produced during the test are readable.  When all images are produced, the platform will slide out of the tunnel. The procedure may vary among health care providers and hospitals. What happens after the procedure?  You may be taken to a recovery area if sedation medicines were used. Your blood pressure, heart rate, breathing rate, and blood oxygen level will be monitored until you leave the hospital or clinic.  If contrast material was used: ? It will leave your body through your urine within a day. You may be told to drink plenty of fluids to help flush the contrast material out of your system. ? If you are breastfeeding, do not breastfeed your child until your health care provider says that this is safe.  You may return to your normal activities right away, or as told by your health care provider.  It is up to you to get your test results. Ask your health care provider, or the department that is doing the test, when your results will be ready. Summary  Magnetic  resonance imaging (MRI) is a painless test that produces detailed pictures of the inside of your body without using X-rays. Instead, strong magnets and radio waves work together in a Research officer, political party to form very detailed and sharp images.  Contrast material, also called contrast dye, may be injected into your body to make MRI images even clearer.  Before your MRI, be sure to tell your health care provider about any metal you may have in your body.  Talk with your health care provider about what your test results mean. This information is not intended to replace advice given to you by your health care provider. Make sure you discuss any questions you have with your health care provider. Document Revised: 11/03/2017 Document Reviewed: 11/03/2017 Elsevier Patient Education  2020 [employer].

## 2020-10-04 NOTE — Progress Notes (Signed)
Electrophysiology Office Follow up Visit Note:    Date:  10/04/2020   ID:  April Fleming, DOB 18-Jul-2001, MRN [id number]  PCP:  Verl Bangs, [place] Cardiologist:  No primary care provider on file.  [facility] Electrophysiologist:  Vickie Epley, MD    Interval History:    April Fleming is a 19 y.o. female who presents for a follow up visit. They were last seen in clinic September 13, 2020. Since their last appointment, she had a surface echocardiogram with bubble study which did show bubbles in the left side the heart although detailed visualization of the interatrial septum and right ventricle was very limited.   Past Medical History:  Diagnosis Date  . Abdominal pain   . Vomiting     Past Surgical History:  Procedure Laterality Date  . WISDOM TOOTH EXTRACTION      Current Medications: Current Meds  Medication Sig  . acetaminophen (TYLENOL) 500 MG tablet Take 500 mg by mouth every 6 (six) hours as needed.  Marland Kitchen aspirin-acetaminophen-caffeine (EXCEDRIN MIGRAINE) 250-250-65 MG tablet Take by mouth every 6 (six) hours as needed for headache.  . dicyclomine (BENTYL) 10 MG capsule Take 10 mg by mouth 3 (three) times daily as needed for spasms.  . hydrOXYzine (ATARAX/VISTARIL) 10 MG tablet Take 0.5 tablets (5 mg total) by mouth 3 (three) times daily as needed for anxiety.  . LOW-OGESTREL 0.3-30 MG-MCG tablet TAKE 1 TABLET BY MOUTH ONCE DAILY  . Multiple Vitamins-Calcium (ONE-A-DAY WOMENS PO) Take by mouth. Most days  . Peppermint Oil 90 MG CPCR Take 1 capsule by mouth 3 (three) times daily as needed.     Allergies:   Fluoxetine   Social History   Socioeconomic History  . Marital status: Single    Spouse name: Not on file  . Number of children: Not on file  . Years of education: Not on file  . Highest education level: High school graduate  Occupational History  . Not on file  Tobacco Use  . Smoking status: Never Smoker  . Smokeless tobacco:  Current User  Vaping Use  . Vaping Use: Former  . Quit date: 05/12/2019  . Substances: Nicotine, Flavoring  . Devices: self-filled vaporizer  Substance and Sexual Activity  . Alcohol use: Never  . Drug use: Never  . Sexual activity: Yes    Birth control/protection: Pill  Other Topics Concern  . Not on file  Social History Narrative  . Not on file   Social Determinants of Health   Financial Resource Strain:   . Difficulty of Paying Living Expenses: Not on file  Food Insecurity:   . Worried About Charity fundraiser in the Last Year: Not on file  . Ran Out of Food in the Last Year: Not on file  Transportation Needs:   . Lack of Transportation (Medical): Not on file  . Lack of Transportation (Non-Medical): Not on file  Physical Activity:   . Days of Exercise per Week: Not on file  . Minutes of Exercise per Session: Not on file  Stress:   . Feeling of Stress : Not on file  Social Connections:   . Frequency of Communication with Friends and Family: Not on file  . Frequency of Social Gatherings with Friends and Family: Not on file  . Attends Religious Services: Not on file  . Active Member of Clubs or Organizations: Not on file  . Attends Archivist Meetings: Not on file  .  Marital Status: Not on file     Family History: The patient's family history includes COPD in her mother; Dementia in her paternal grandmother; Healthy in her maternal grandfather and other family members; Melanoma in her father and maternal grandmother.  ROS:   Please see the history of present illness.    All other systems reviewed and are negative.  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  There is evidence of right to left shunting.  Detailed examination of the interatrial septum is  limited.  Previous EKG personally reviewed:  The ekg ordered today demonstrates incomplete right bundle branch block with borderline right axis deviation  Recent Labs: 07/03/2020: BUN 9; Creatinine, Ser 0.70; Hemoglobin 12.7; Platelets 273; Potassium 3.8; Sodium 134  Recent Lipid Panel No results found for: CHOL, TRIG, HDL, CHOLHDL, VLDL, LDLCALC, LDLDIRECT  Physical Exam:    VS:  BP 120/80 (BP Location: Left Arm, Patient Position: Sitting, Cuff Size: Normal)   Pulse 80   Ht 5\' 4"  (1.626 m)   Wt 122 lb (55.3 kg)   SpO2 99%   BMI 20.94 kg/m     Wt Readings from Last 3 Encounters:  10/04/20 122 lb (55.3 kg)  09/13/20 119 lb 12.8 oz (54.3 kg)  09/11/20 120 lb 3.2 oz (54.5 kg)     GEN:  Well nourished, well developed in no acute distress HEENT: Normal NECK: No JVD; No carotid bruits LYMPHATICS: No lymphadenopathy CARDIAC: RRR, soft (2 out of 6) systolic murmur along the left sternal border with respiratory variation, rubs, gallops RESPIRATORY:  Clear to auscultation without rales, wheezing or rhonchi  ABDOMEN: Soft, non-tender, non-distended MUSCULOSKELETAL:  No edema; No deformity  SKIN: Warm and dry NEUROLOGIC:  Alert and oriented x 3 PSYCHIATRIC:  Normal affect   ASSESSMENT:    1. ASD (atrial septal defect)    PLAN:    In order of problems listed above:  1. Suspected ASD Patient with EKG and physical exam findings suggestive of an ASD.  Echo study did show right to left shunting.  No evidence of right heart strain by echo although imaging the right ventricle has been limited.  Would like to further characterize interatrial septum and exclude other causes of right to left shunting with a cardiac MRI. Follow-up based on MRI results.   Medication Adjustments/Labs and Tests Ordered: Current medicines are reviewed at length with the patient today.  Concerns regarding medicines are outlined above.  Orders Placed This Encounter  Procedures  . MR Card Morphology Wo/W Cm    No orders of the defined types were placed in this encounter.    Signed, Lars Mage, MD, [facility]  10/04/2020 9:59 AM    Electrophysiology [facility]

## 2020-10-04 NOTE — Telephone Encounter (Signed)
Copied from [place] [id number]. Topic: Quick Communication - Rx Refill/Question >> Oct 04, 2020 10:59 AM Yvette Rack wrote: Medication: LOW-OGESTREL 0.3-30 MG-MCG tablet  Has the patient contacted their pharmacy? no  Preferred Pharmacy (with phone number or street name): [facility], [place]. Phone: [phone]   Fax: [phone]  Agent: Please be advised that RX refills may take up to 3 business days. We ask that you follow-up with your pharmacy.

## 2020-10-09 ENCOUNTER — Telehealth: Payer: Medicaid Other | Admitting: Family Medicine

## 2020-10-12 ENCOUNTER — Encounter: Payer: Self-pay | Admitting: Cardiology

## 2020-10-12 ENCOUNTER — Telehealth: Payer: Self-pay | Admitting: Cardiology

## 2020-10-12 NOTE — Telephone Encounter (Signed)
Spoke with patient regarding appointment for cardiac mri scheduled Tuesday 10/31/20 at 12:00pm--arrival time is 11:30 am 1st floor admissions office at [facility].  Will mail information to patient and she voiced her understanding

## 2020-10-13 DIAGNOSIS — R569 Unspecified convulsions: Secondary | ICD-10-CM | POA: Diagnosis not present

## 2020-10-17 DIAGNOSIS — R569 Unspecified convulsions: Secondary | ICD-10-CM | POA: Diagnosis not present

## 2020-10-24 DIAGNOSIS — R55 Syncope and collapse: Secondary | ICD-10-CM | POA: Diagnosis not present

## 2020-10-24 DIAGNOSIS — R569 Unspecified convulsions: Secondary | ICD-10-CM | POA: Diagnosis not present

## 2020-10-30 ENCOUNTER — Telehealth (HOSPITAL_COMMUNITY): Payer: Self-pay | Admitting: Emergency Medicine

## 2020-10-30 NOTE — Telephone Encounter (Signed)
Attempted to call patient regarding upcoming cardiac CT appointment. °Left message on voicemail with name and callback number °Lorie Melichar RN Navigator Cardiac Imaging °[facility] °[phone] Office °[phone] Cell ° °

## 2020-10-31 ENCOUNTER — Other Ambulatory Visit: Payer: Self-pay

## 2020-10-31 ENCOUNTER — Ambulatory Visit (HOSPITAL_COMMUNITY)
Admission: RE | Admit: 2020-10-31 | Discharge: 2020-10-31 | Disposition: A | Discharge status: Home / Self Care | Payer: Medicaid Other | Source: Ambulatory Visit | Attending: Cardiology | Admitting: Cardiology

## 2020-10-31 DIAGNOSIS — Q211 Atrial septal defect, unspecified: Secondary | ICD-10-CM

## 2020-10-31 MED ORDER — GADOBUTROL 1 MMOL/ML IV SOLN
6.0000 mL | Freq: Once | INTRAVENOUS | Status: AC | PRN
  Administered 2020-10-31: 6 mL via INTRAVENOUS

## 2020-11-06 NOTE — Telephone Encounter (Signed)
Patient calling to check on status.

## 2020-12-13 ENCOUNTER — Other Ambulatory Visit: Payer: Self-pay

## 2020-12-13 ENCOUNTER — Encounter: Payer: Self-pay | Admitting: Family Medicine

## 2020-12-13 ENCOUNTER — Telehealth (INDEPENDENT_AMBULATORY_CARE_PROVIDER_SITE_OTHER): Payer: Medicaid Other | Admitting: Family Medicine

## 2020-12-13 DIAGNOSIS — R059 Cough, unspecified: Secondary | ICD-10-CM | POA: Insufficient documentation

## 2020-12-13 MED ORDER — GUAIFENESIN ER 600 MG PO TB12: 1200.0000 mg | ORAL_TABLET | Freq: Two times a day (BID) | ORAL | 0 refills | Status: AC

## 2020-12-13 NOTE — Progress Notes (Signed)
Virtual Visit via Telephone  The purpose of this virtual visit is to provide medical care while limiting exposure to the novel coronavirus (COVID19) for both patient and office staff.  Consent was obtained for phone visit:  Yes.   Answered questions that patient had about telehealth interaction:  Yes.   I discussed the limitations, risks, security and privacy concerns of performing an evaluation and management service by telephone. I also discussed with the patient that there may be a patient responsible charge related to this service. The patient expressed understanding and agreed to proceed.  Patient is at home and is accessed via telephone Services are provided by Harlin Rain, FNP-C from [facility])  ---------------------------------------------------------------------- Chief Complaint  Patient presents with  . Sore Throat    Post nasal drainage, severe sore throat, swollen lymph node, fever 100.4, one episode of diarrhea  and productive coughing x 3 days      S: Reviewed CMA documentation. I have called patient and gathered additional HPI as follows:  April Fleming presents for virtual visit via telephone for concerns of mucus production and swollen lymph nodes in her neck.  Reports approximately 3 days ago she had a sore throat, some post-nasal drainage, a fever up to 100.4 x 1, one episode of diarrhea and a productive cough.  Reports symptoms are starting to improve.  Denies any known exposure to any sick contacts.  Has not had COVID testing.  Has not been taking anything for her symptoms.  Patient is currently home Denies any high risk travel to areas of current concern for COVID19. Denies any known or suspected exposure to person with or possibly with COVID19.  Past Medical History:  Diagnosis Date  . Abdominal pain   . Vomiting    Social History   Tobacco Use  . Smoking status: Never Smoker  . Smokeless tobacco: Never Used  Vaping Use  . Vaping  Use: Former  . Quit date: 05/12/2019  . Substances: Nicotine, Flavoring  . Devices: self-filled vaporizer  Substance Use Topics  . Alcohol use: Never  . Drug use: Never    Current Outpatient Medications:  .  acetaminophen (TYLENOL) 500 MG tablet, Take 500 mg by mouth every 6 (six) hours as needed., Disp: , Rfl:  .  aspirin-acetaminophen-caffeine (EXCEDRIN MIGRAINE) 250-250-65 MG tablet, Take by mouth every 6 (six) hours as needed for headache., Disp: , Rfl:  .  dicyclomine (BENTYL) 10 MG capsule, Take 10 mg by mouth 3 (three) times daily as needed for spasms., Disp: , Rfl:  .  Multiple Vitamins-Calcium (ONE-A-DAY WOMENS PO), Take by mouth. Most days, Disp: , Rfl:  .  norgestrel-ethinyl estradiol (LOW-OGESTREL) 0.3-30 MG-MCG tablet, Take 1 tablet by mouth daily., Disp: 84 tablet, Rfl: 0 .  Omega-3 Fatty Acids (FISH OIL) 435 MG CAPS, Take by mouth., Disp: , Rfl:  .  Peppermint Oil 90 MG CPCR, Take 1 capsule by mouth 3 (three) times daily as needed., Disp: , Rfl:  .  dicyclomine (BENTYL) 10 MG capsule, Take 10 mg by mouth in the morning, at noon, in the evening, and at bedtime. (Patient not taking: Reported on 12/13/2020), Disp: , Rfl:  .  guaiFENesin (MUCINEX) 600 MG 12 hr tablet, Take 2 tablets (1,200 mg total) by mouth 2 (two) times daily for 10 days., Disp: 40 tablet, Rfl: 0 .  hydrOXYzine (ATARAX/VISTARIL) 10 MG tablet, Take 0.5 tablets (5 mg total) by mouth 3 (three) times daily as needed for anxiety. (Patient not taking: Reported  on 12/13/2020), Disp: 90 tablet, Rfl: 1  Depression screen [facility] 2/9 09/11/2020 09/01/2020 05/18/2019  Decreased Interest 0 1 0  Down, Depressed, Hopeless 0 1 0  PHQ - 2 Score 0 2 0  Altered sleeping 1 2 -  Tired, decreased energy 1 2 -  Change in appetite 0 1 -  Feeling bad or failure about yourself  0 0 -  Trouble concentrating 0 0 -  Moving slowly or fidgety/restless 1 1 -  Suicidal thoughts 0 0 -  PHQ-9 Score 3 8 -  Difficult doing work/chores Not  difficult at all Somewhat difficult -    GAD 7 : Generalized Anxiety Score 09/11/2020 09/01/2020 09/17/2018  Nervous, Anxious, on Edge 2 3 3   Control/stop worrying 3 3 3   Worry too much - different things 3 3 3   Trouble relaxing 2 3 1   Restless 1 3 0  Easily annoyed or irritable 2 3 3   Afraid - awful might happen 3 3 3   Total GAD 7 Score 16 21 16   Anxiety Difficulty Somewhat difficult Very difficult Somewhat difficult    -------------------------------------------------------------------------- O: No physical exam performed due to remote telephone encounter.  Physical Exam: Patient remotely monitored without video.  Verbal communication appropriate.  Cognition normal.  Recent Results (from the past 2160 hour(s))  ECHOCARDIOGRAM COMPLETE     Status: None   Collection Time: 09/26/20  4:58 PM  Result Value Ref Range   AR max vel 2.28 cm2   AV Peak grad 6.4 mmHg   Ao pk vel 1.26 m/s   S' Lateral 2.50 cm   Area-P 1/2 4.71 cm2   AV Area VTI 2.13 cm2   AV Mean grad 4.0 mmHg   Single Plane A4C EF 58.0 %   Single Plane A2C EF 59.7 %   Calc EF 58.0 %   AV Area mean vel 2.31 cm2    -------------------------------------------------------------------------- A&P:  Problem List Items Addressed This Visit      Other   Cough - Primary    Likely viral infection vs seasonal allergies based on quick improvement in symptoms.  Discussed can take mucinex to help with thinning of secretions to help them clear.  If symptoms worsen, encouraged to have COVID test and discussed if persist > 10 days and worsening symptoms, can discuss if turning into sinus infection and would then tx with ABx.  Patient in agreement with treatment plan.      Relevant Medications   guaiFENesin (MUCINEX) 600 MG 12 hr tablet      Meds ordered this encounter  Medications  . guaiFENesin (MUCINEX) 600 MG 12 hr tablet    Sig: Take 2 tablets (1,200 mg total) by mouth 2 (two) times daily for 10 days.    Dispense:  40  tablet    Refill:  0    Follow-up: - Return if symptoms worsen or fail to improve  Patient verbalizes understanding with the above medical recommendations including the limitation of remote medical advice.  Specific follow-up and call-back criteria were given for patient to follow-up or seek medical care more urgently if needed.  - Time spent in direct consultation with patient on phone: 6 minutes  Harlin Rain, [facility] 12/13/2020, 2:03 PM

## 2020-12-13 NOTE — Assessment & Plan Note (Signed)
Likely viral infection vs seasonal allergies based on quick improvement in symptoms.  Discussed can take mucinex to help with thinning of secretions to help them clear.  If symptoms worsen, encouraged to have COVID test and discussed if persist > 10 days and worsening symptoms, can discuss if turning into sinus infection and would then tx with ABx.  Patient in agreement with treatment plan.

## 2020-12-23 DIAGNOSIS — U071 COVID-19: Secondary | ICD-10-CM

## 2020-12-23 HISTORY — DX: COVID-19: U07.1

## 2020-12-27 IMAGING — CT CT CERVICAL SPINE W/O CM
3 of 4 series · 12 of 33 positions shown, 14 images · non-contrast
Comparison: None.

CLINICAL DATA: The patient suffered an episode of dizziness,
syncope and a fall this morning. Initial encounter.

EXAM:
CT HEAD WITHOUT CONTRAST
CT CERVICAL SPINE WITHOUT CONTRAST
TECHNIQUE: Multidetector CT imaging of the head and cervical spine was
performed following the standard protocol without intravenous
contrast. Multiplanar CT image reconstructions of the cervical spine
were also generated.

[Series 4: sagittal bone · sagittal · 0.25mm/px · 5 of 54 slices shown, 6 images]
[im 18/54  bone]
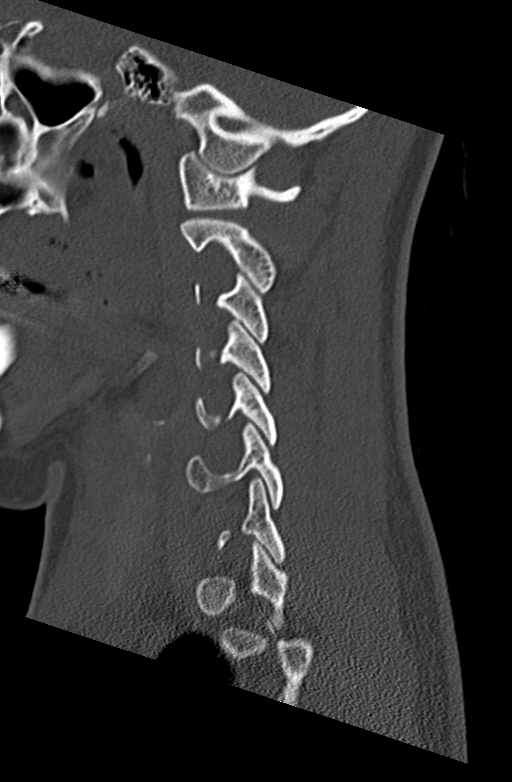
[im 23/54  bone]
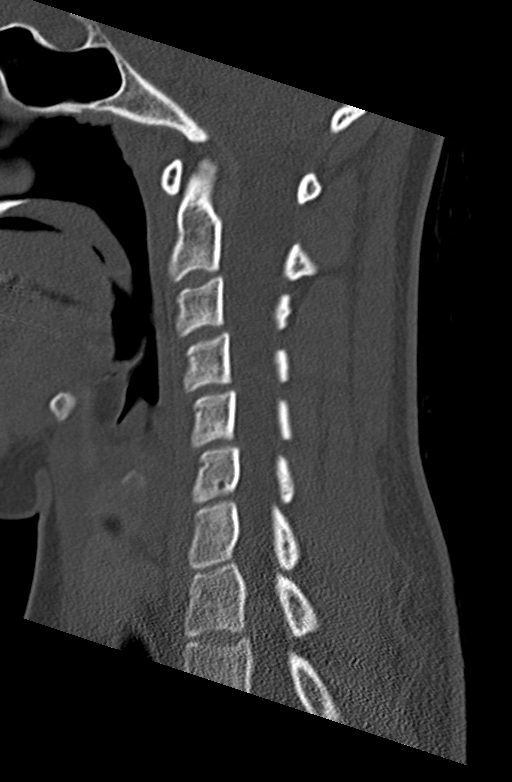
[im 27/54  soft-tissue]
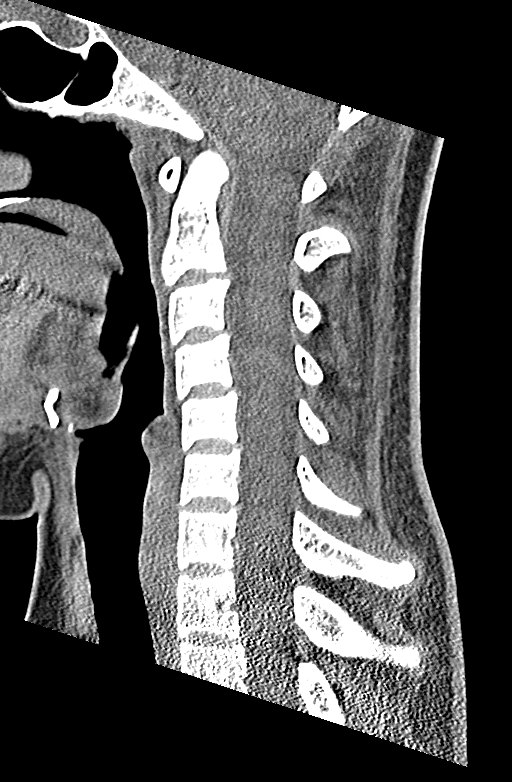
[im 27/54  bone]
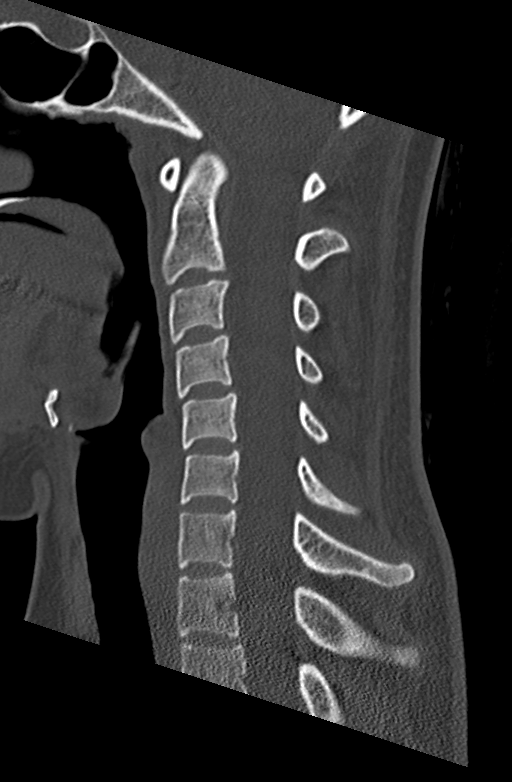
[im 31/54  bone]
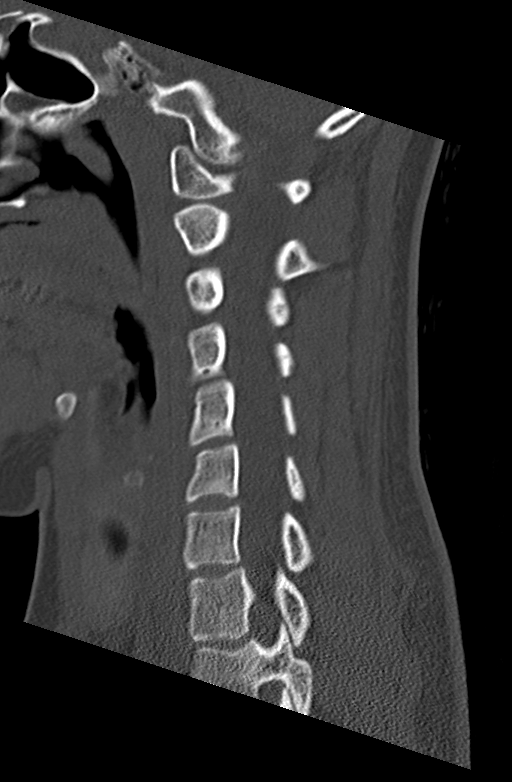
[im 36/54  bone]
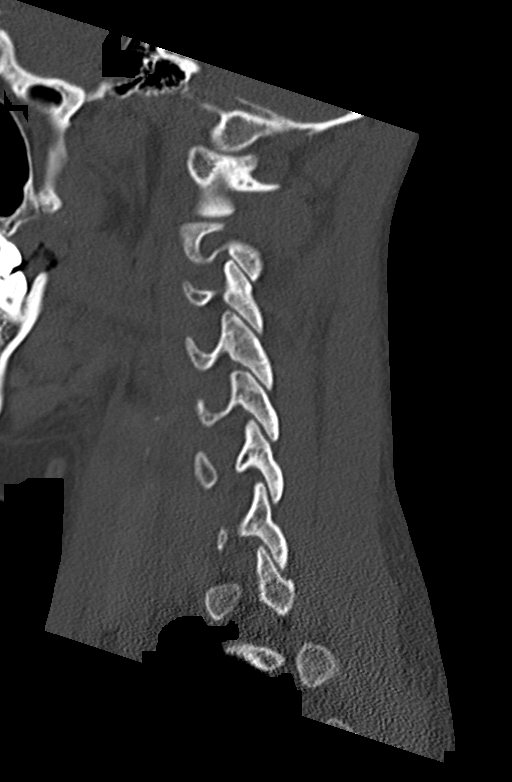

[Series 5: coronal bone · coronal · 0.21mm/px · 3 of 50 slices shown]
[im 10/50  bone]
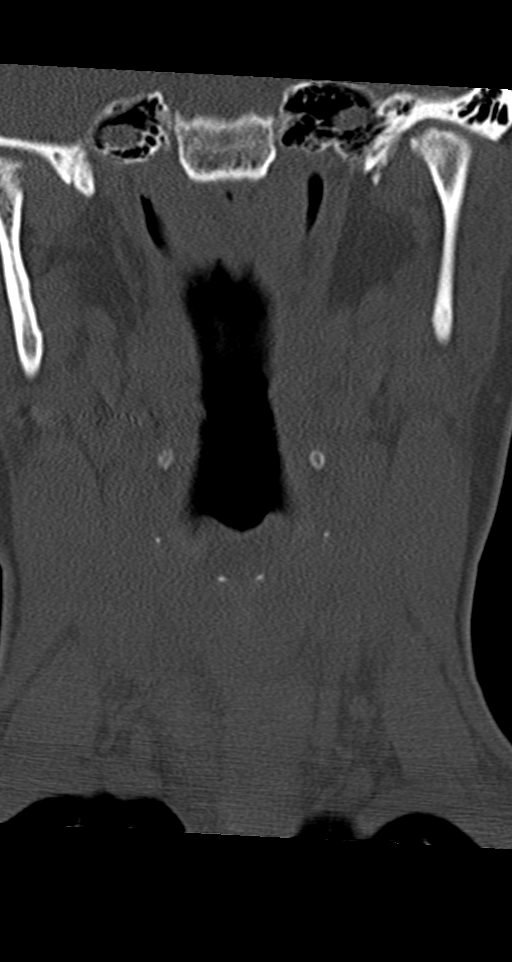
[im 20/50  bone]
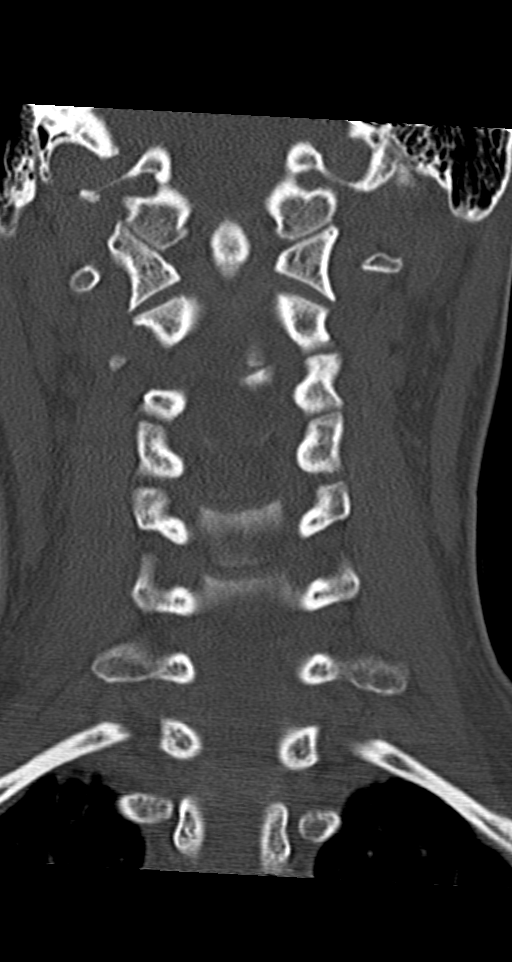
[im 30/50  bone]
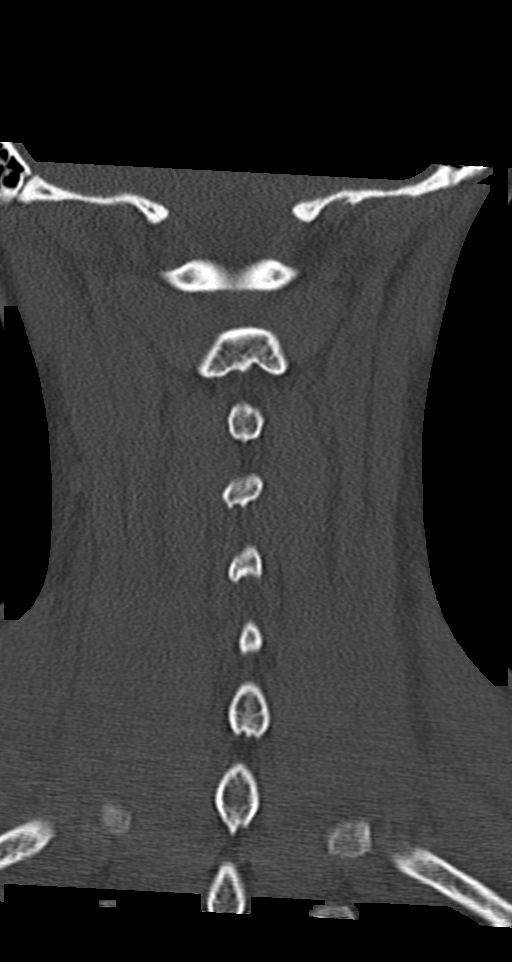

[Series 6: orthogonal bone · axial · 0.24mm/px · z∈[+226,+350]mm · 4 of 95 slices shown, 5 images]
[im 14/95  soft-tissue]
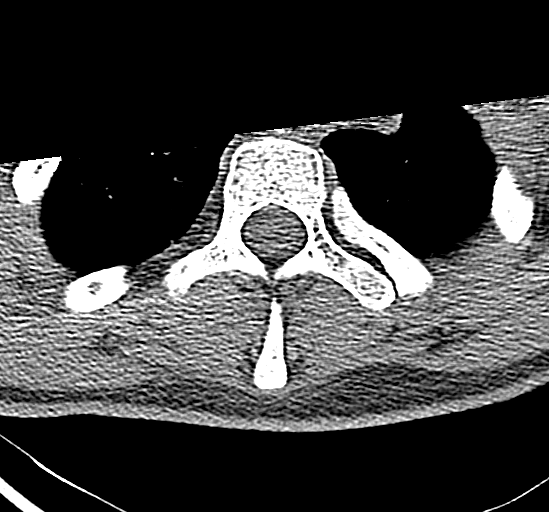
[im 14/95  bone]
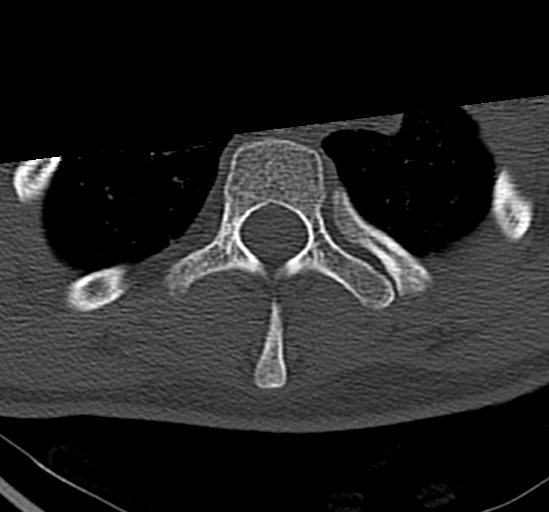
[im 41/95  bone]
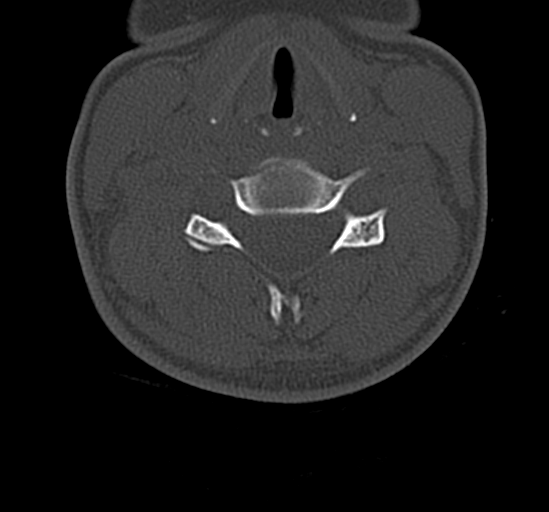
[im 54/95  bone]
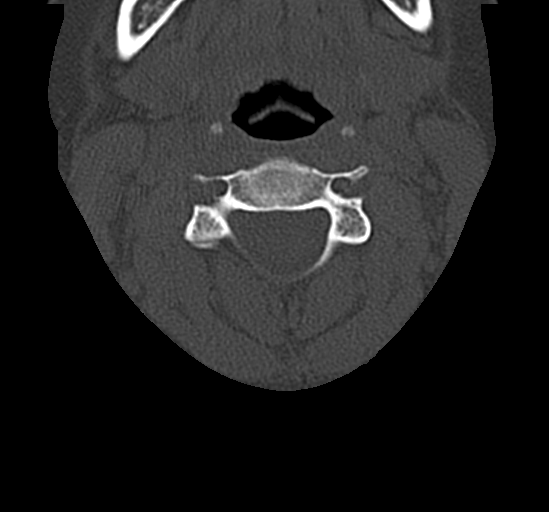
[im 81/95  bone]
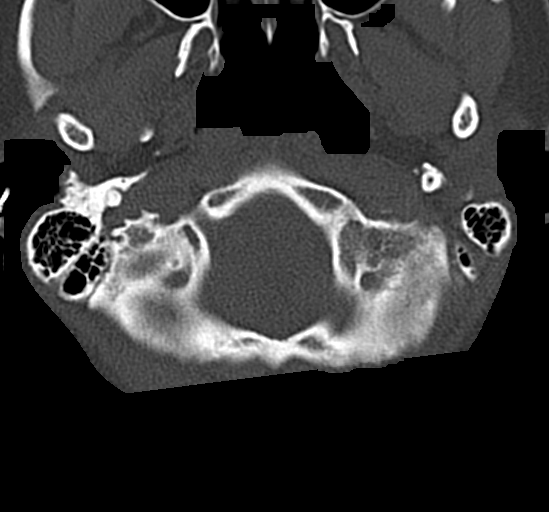

[12 of 33 positions shown; findings below may reference images not displayed]

FINDINGS: CT HEAD FINDINGS

Brain: No evidence of acute infarction, hemorrhage, hydrocephalus,
extra-axial collection or mass lesion/mass effect.

Vascular: No hyperdense vessel or unexpected calcification.

Skull: Normal. Negative for fracture or focal lesion.

Sinuses/Orbits: Negative.

Other: None.

CT CERVICAL SPINE FINDINGS

Alignment: Normal with straightening of lordosis noted.

Skull base and vertebrae: No acute fracture. No primary bone lesion
or focal pathologic process.

Soft tissues and spinal canal: No prevertebral fluid or swelling. No
visible canal hematoma.

Disc levels:  Disc space height is maintained at all levels.

Upper chest: Negative.

Other: None.
IMPRESSION: Negative head and cervical spine CT scans.

## 2021-01-01 ENCOUNTER — Other Ambulatory Visit: Payer: Self-pay | Admitting: Family Medicine

## 2021-01-01 ENCOUNTER — Telehealth: Payer: Self-pay | Admitting: Family Medicine

## 2021-01-01 DIAGNOSIS — Z3041 Encounter for surveillance of contraceptive pills: Secondary | ICD-10-CM

## 2021-01-01 NOTE — Telephone Encounter (Signed)
Medication Refill - Medication: Cryselle BC   Has the patient contacted their pharmacy? No. (Agent: If no, request that the patient contact the pharmacy for the refill.) (Agent: If yes, when and what did the pharmacy advise?)  Preferred Pharmacy (with phone number or street name): [facility]  Agent: Please be advised that RX refills may take up to 3 business days. We ask that you follow-up with your pharmacy.

## 2021-01-05 ENCOUNTER — Encounter: Payer: Self-pay | Admitting: Family Medicine

## 2021-01-05 ENCOUNTER — Telehealth (INDEPENDENT_AMBULATORY_CARE_PROVIDER_SITE_OTHER): Payer: Medicaid Other | Admitting: Family Medicine

## 2021-01-05 DIAGNOSIS — U071 COVID-19: Secondary | ICD-10-CM | POA: Insufficient documentation

## 2021-01-05 MED ORDER — PREDNISONE 50 MG PO TABS: ORAL_TABLET | ORAL | 0 refills | Status: DC

## 2021-01-05 MED ORDER — ALBUTEROL SULFATE HFA 108 (90 BASE) MCG/ACT IN AERS: 2.0000 | INHALATION_SPRAY | Freq: Four times a day (QID) | RESPIRATORY_TRACT | 0 refills | Status: DC | PRN

## 2021-01-05 NOTE — Progress Notes (Signed)
Virtual Visit via Telephone  The purpose of this virtual visit is to provide medical care while limiting exposure to the novel coronavirus (COVID19) for both patient and office staff.  Consent was obtained for phone visit:  Yes.   Answered questions that patient had about telehealth interaction:  Yes.   I discussed the limitations, risks, security and privacy concerns of performing an evaluation and management service by telephone. I also discussed with the patient that there may be a patient responsible charge related to this service. The patient expressed understanding and agreed to proceed.  Patient is at home and is accessed via telephone Services are provided by Harlin Rain, FNP-C from [facility])  ---------------------------------------------------------------------- Chief Complaint  Patient presents with  . Covid Positive    Pt tested positive for COVID x 5 days, chest tightness, productive coughing, nasal congestion and sinus pressure     S: Reviewed CMA documentation. I have called patient and gathered additional HPI as follows:  [name] presents for virtual telemedicine visit via telephone for positive COVID test on 01/02/2021 with symptom onset on Sunday night/Monday morning.  She reports having nasal congestion with sinus pressure, productive cough, chest tightness, and an episode of fever up to 101.25F.  Has had an improvement in her nasal congestion and sinus pressure but still has chest tightness and productive cough.  Denies sore throat, change in taste/smell, SOB, DOE, CP, abdominal pain, n/v/d.  Has an inhaler at home that is albuterol and is her Mom's.  Did try it with some improvement in her chest symptoms.  Patient is currently home Denies any high risk travel to areas of current concern for COVID19. Denies any known or suspected exposure to person with or possibly with COVID19.  Past Medical History:  Diagnosis Date  . Abdominal  pain   . Vomiting    Social History   Tobacco Use  . Smoking status: Never Smoker  . Smokeless tobacco: Never Used  Vaping Use  . Vaping Use: Former  . Quit date: 05/12/2019  . Substances: Nicotine, Flavoring  . Devices: self-filled vaporizer  Substance Use Topics  . Alcohol use: Never  . Drug use: Never    Current Outpatient Medications:  .  acetaminophen (TYLENOL) 500 MG tablet, Take 500 mg by mouth every 6 (six) hours as needed., Disp: , Rfl:  .  albuterol (VENTOLIN HFA) 108 (90 Base) MCG/ACT inhaler, Inhale 2 puffs into the lungs every 6 (six) hours as needed for wheezing or shortness of breath., Disp: 8 g, Rfl: 0 .  aspirin-acetaminophen-caffeine (EXCEDRIN MIGRAINE) 250-250-65 MG tablet, Take by mouth every 6 (six) hours as needed for headache., Disp: , Rfl:  .  CRYSELLE-28 0.3-30 MG-MCG tablet, TAKE 1 TABLET BY MOUTH ONCE DAILY, Disp: 84 tablet, Rfl: 0 .  dicyclomine (BENTYL) 10 MG capsule, Take 10 mg by mouth 3 (three) times daily as needed for spasms., Disp: , Rfl:  .  hydrOXYzine (ATARAX/VISTARIL) 10 MG tablet, Take 0.5 tablets (5 mg total) by mouth 3 (three) times daily as needed for anxiety., Disp: 90 tablet, Rfl: 1 .  Multiple Vitamins-Calcium (ONE-A-DAY WOMENS PO), Take by mouth. Most days, Disp: , Rfl:  .  Omega-3 Fatty Acids (FISH OIL) 435 MG CAPS, Take by mouth., Disp: , Rfl:  .  Peppermint Oil 90 MG CPCR, Take 1 capsule by mouth 3 (three) times daily as needed., Disp: , Rfl:  .  predniSONE (DELTASONE) 50 MG tablet, Take 1 tablet daily x 5 days,  Disp: 5 tablet, Rfl: 0 .  dicyclomine (BENTYL) 10 MG capsule, Take 10 mg by mouth in the morning, at noon, in the evening, and at bedtime. (Patient not taking: Reported on 01/05/2021), Disp: , Rfl:   Depression screen [facility] 2/9 09/11/2020 09/01/2020 05/18/2019  Decreased Interest 0 1 0  Down, Depressed, Hopeless 0 1 0  PHQ - 2 Score 0 2 0  Altered sleeping 1 2 -  Tired, decreased energy 1 2 -  Change in appetite 0 1 -  Feeling  bad or failure about yourself  0 0 -  Trouble concentrating 0 0 -  Moving slowly or fidgety/restless 1 1 -  Suicidal thoughts 0 0 -  PHQ-9 Score 3 8 -  Difficult doing work/chores Not difficult at all Somewhat difficult -    GAD 7 : Generalized Anxiety Score 09/11/2020 09/01/2020 09/17/2018  Nervous, Anxious, on Edge 2 3 3   Control/stop worrying 3 3 3   Worry too much - different things 3 3 3   Trouble relaxing 2 3 1   Restless 1 3 0  Easily annoyed or irritable 2 3 3   Afraid - awful might happen 3 3 3   Total GAD 7 Score 16 21 16   Anxiety Difficulty Somewhat difficult Very difficult Somewhat difficult    -------------------------------------------------------------------------- O: No physical exam performed due to remote telephone encounter.  Physical Exam: Patient remotely monitored without video.  Verbal communication appropriate.  Cognition normal.  No results found for this or any previous visit (from the past 2160 hour(s)).  -------------------------------------------------------------------------- A&P:  Problem List Items Addressed This Visit      Other   COVID-19 - Primary    COVID-19, Dx 01/02/2021 with sx onset 01/09 or 01/10.  Will treat with albuterol inhaler 1-2 puffs every 4-6 hours PRN for SOB and chest tightness and prednisone 50mg , 1 tablet daily x 5 days.  Has mucinex at home and will restart this from an old prescription.  Discussed strict ER precautions.  Work note provided.      Relevant Medications   albuterol (VENTOLIN HFA) 108 (90 Base) MCG/ACT inhaler   predniSONE (DELTASONE) 50 MG tablet      Meds ordered this encounter  Medications  . albuterol (VENTOLIN HFA) 108 (90 Base) MCG/ACT inhaler    Sig: Inhale 2 puffs into the lungs every 6 (six) hours as needed for wheezing or shortness of breath.    Dispense:  8 g    Refill:  0  . predniSONE (DELTASONE) 50 MG tablet    Sig: Take 1 tablet daily x 5 days    Dispense:  5 tablet    Refill:  0     Follow-up: - Return if symptoms worsen or fail to improve  Patient verbalizes understanding with the above medical recommendations including the limitation of remote medical advice.  Specific follow-up and call-back criteria were given for patient to follow-up or seek medical care more urgently if needed.  - Time spent in direct consultation with patient on phone: 7 minutes  Harlin Rain, [facility] 01/05/2021, 11:07 AM

## 2021-01-05 NOTE — Assessment & Plan Note (Signed)
COVID-19, Dx 01/02/2021 with sx onset 01/09 or 01/10.  Will treat with albuterol inhaler 1-2 puffs every 4-6 hours PRN for SOB and chest tightness and prednisone 50mg , 1 tablet daily x 5 days.  Has mucinex at home and will restart this from an old prescription.  Discussed strict ER precautions.  Work note provided.

## 2021-01-24 ENCOUNTER — Ambulatory Visit (INDEPENDENT_AMBULATORY_CARE_PROVIDER_SITE_OTHER): Payer: Medicaid Other

## 2021-01-24 ENCOUNTER — Ambulatory Visit
Admission: EM | Admit: 2021-01-24 | Discharge: 2021-01-24 | Disposition: A | Discharge status: Home / Self Care | Payer: Medicaid Other | Attending: Emergency Medicine | Admitting: Emergency Medicine

## 2021-01-24 ENCOUNTER — Encounter: Payer: Self-pay | Admitting: Emergency Medicine

## 2021-01-24 ENCOUNTER — Other Ambulatory Visit: Payer: Self-pay

## 2021-01-24 DIAGNOSIS — M940 Chondrocostal junction syndrome [Tietze]: Secondary | ICD-10-CM | POA: Diagnosis not present

## 2021-01-24 DIAGNOSIS — R222 Localized swelling, mass and lump, trunk: Secondary | ICD-10-CM | POA: Diagnosis not present

## 2021-01-24 MED ORDER — KETOROLAC TROMETHAMINE 10 MG PO TABS: 10.0000 mg | ORAL_TABLET | Freq: Four times a day (QID) | ORAL | 0 refills | Status: DC | PRN

## 2021-01-24 NOTE — ED Provider Notes (Signed)
[facility]    CSN: [id number] Arrival date & time: 01/24/21  1258      History   Chief Complaint Chief Complaint  Patient presents with  . Shortness of Breath  . Chest Pain    HPI April Fleming is a 20 y.o. female.   HPI   20 year old female here for evaluation of left-sided chest pain and shortness of breath.  Patient reports that she has a spot on her left chest wall that will become inflamed from time to time, especially if she overexerts herself, and produces pain that causes shortness of breath.  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  Patient has a marked deformity on her left chest wall that is tender to touch.  Past Medical History:  Diagnosis Date  . Abdominal pain   . COVID-19 12/23/2020  . Vomiting     Patient Active Problem List   Diagnosis Date Noted  . COVID-19 01/05/2021  . Cough 12/13/2020  . Vasovagal syncope 09/01/2020  . Anxiety 09/01/2020  . Post concussion syndrome 07/05/2020  . Heart murmur 07/05/2020  . Intractable migraine without aura and without status migrainosus 06/17/2018  . Temporomandibular joint disorder (TMJ) 06/17/2018  . Abdominal pain   . Vomiting     Past Surgical History:  Procedure Laterality Date  . WISDOM TOOTH EXTRACTION      OB History   No obstetric history on file.      Home Medications    Prior to Admission medications   Medication Sig Start Date End Date Taking? Authorizing Provider  acetaminophen (TYLENOL) 500 MG tablet Take 500 mg by mouth every 6 (six) hours as needed.   Yes [provider]  albuterol (VENTOLIN HFA) 108 (90 Base) MCG/ACT inhaler Inhale 2 puffs into the lungs every 6 (six) hours as needed for wheezing or shortness of breath. 01/05/21  Yes Malfi, Lupita Raider, FNP  CRYSELLE-28 0.3-30 MG-MCG tablet TAKE 1 TABLET BY MOUTH ONCE DAILY 01/01/21  Yes [name], Devonne Doughty, DO  dicyclomine  (BENTYL) 10 MG capsule Take 10 mg by mouth 3 (three) times daily as needed for spasms.   Yes [provider]  ketorolac (TORADOL) 10 MG tablet Take 1 tablet (10 mg total) by mouth every 6 (six) hours as needed. 01/24/21  Yes Margarette Canada, NP  Peppermint Oil 90 MG CPCR Take 1 capsule by mouth 3 (three) times daily as needed. 03/23/20  Yes [name], Devonne Doughty, DO  aspirin-acetaminophen-caffeine (EXCEDRIN MIGRAINE) [phone] MG tablet Take by mouth every 6 (six) hours as needed for headache.    [provider]  dicyclomine (BENTYL) 10 MG capsule Take 10 mg by mouth in the morning, at noon, in the evening, and at bedtime. Patient not taking: Reported on 01/05/2021    [provider]    Family History Family History  Problem Relation Age of Onset  . COPD Mother   . Melanoma Father   . Melanoma Maternal Grandmother   . Healthy Maternal Grandfather   . Dementia Paternal Grandmother   . Healthy Other   . Healthy Other     Social History Social History   Tobacco Use  . Smoking status: Never Smoker  . Smokeless tobacco: Never Used  Vaping Use  . Vaping Use: Former  . Quit date: 05/12/2019  . Substances: Nicotine, Flavoring  . Devices: self-filled vaporizer  Substance Use Topics  .  Alcohol use: Never  . Drug use: Never     Allergies   Fluoxetine   Review of Systems Review of Systems  Respiratory: Positive for shortness of breath. Negative for cough and wheezing.   Cardiovascular: Positive for chest pain.  Skin: Negative for color change.  Neurological: Negative for dizziness and syncope.  Hematological: Negative.   Psychiatric/Behavioral: Negative.      Physical Exam Triage Vital Signs ED Triage Vitals  Enc Vitals Group     BP      Pulse      Resp      Temp      Temp src      SpO2      Weight      Height      Head Circumference      Peak Flow      Pain Score      Pain Loc      Pain Edu?      Excl. in [place]?    No data  found.  Updated Vital Signs BP 139/89 (BP Location: Right Arm)   Pulse (!) 113   Temp 98.4 F (36.9 C) (Oral)   Resp 18   Ht 5\' 4"  (1.626 m)   Wt 124 lb (56.2 kg)   LMP 01/03/2021   SpO2 100%   BMI 21.28 kg/m   Visual Acuity Right Eye Distance:   Left Eye Distance:   Bilateral Distance:    Right Eye Near:   Left Eye Near:    Bilateral Near:     Physical Exam Vitals and nursing note reviewed.  Constitutional:      General: She is not in acute distress.    Appearance: Normal appearance. She is well-developed and normal weight. She is not ill-appearing or toxic-appearing.  HENT:     Head: Normocephalic and atraumatic.  Cardiovascular:     Rate and Rhythm: Normal rate and regular rhythm.     Pulses: Normal pulses.     Heart sounds: Normal heart sounds.  Pulmonary:     Effort: Pulmonary effort is normal.     Breath sounds: Normal breath sounds. No wheezing or rales.  Skin:    General: Skin is warm and dry.     Findings: No erythema or rash.  Neurological:     General: No focal deficit present.     Mental Status: She is alert and oriented to person, place, and time.  Psychiatric:        Mood and Affect: Mood normal.        Behavior: Behavior normal.        Thought Content: Thought content normal.        Judgment: Judgment normal.      UC Treatments / Results  Labs (all labs ordered are listed, but only abnormal results are displayed) Labs Reviewed - No data to display  EKG   Radiology DG Chest 2 View  Result Date: 01/24/2021 CLINICAL DATA:  Chronic knot overlying the left upper ribcage just below the clavicle. EXAM: CHEST - 2 VIEW COMPARISON:  CT chest dated March 01, 2016. FINDINGS: The heart size and mediastinal contours are within normal limits. Both lungs are clear. The visualized skeletal structures are unremarkable. IMPRESSION: No active cardiopulmonary disease. Electronically Signed   By: Titus Dubin M.D.   On: 01/24/2021 14:29     Procedures Procedures (including critical care time)  Medications Ordered in UC Medications - No data to display  Initial Impression / Assessment and  Plan / UC Course  I have reviewed the triage vital signs and the nursing notes.  Pertinent labs & imaging results that were available during my care of the patient were reviewed by me and considered in my medical decision making (see chart for details).   Patient is here for evaluation of left-sided chest pain and shortness of breath.  Patient has a longstanding history of left-sided chest pain that flares up when she overexerts herself and can cause shortness of breath.  Patient is nontoxic in appearance.  No hypoxia, tachypnea, or dyspnea is present.  Lungs are clear to auscultation all fields.  Heart sounds are S1 is 2 and crisp.  Patient does have a palpable chest wall defect at the level of the third or fourth rib, midclavicular line.  The area is tender to touch as is the intercostal space just superior to it.  No ecchymosis or erythema noted.  Chest x-ray and chest CT scan performed February 26, 2016 and both results were normal.  Due to the inflamed nature and exquisite tenderness exhibited on exam will obtain new chest x-ray to look for anomalies.  Radiology interpretation of chest x-ray is that her skeletal structures are normal in appearance.  Will treat patient for costochondritis with p.o. Toradol and have patient apply moist heat to her chest wall for 20 minutes at a time 2-3 times a day.   Final Clinical Impressions(s) / UC Diagnoses   Final diagnoses:  Costochondritis     Discharge Instructions     Your chest x-ray today did not demonstrate any abnormalities to your skeletal structure in your chest wall.  Use the Toradol every 6 hours with food as needed for pain.  Apply moist heat to your chest wall for 20 minutes at a time 2-3 times a day.  Return for reevaluation, or see your primary care provider, for any new or  worsening symptoms.    ED Prescriptions    Medication Sig Dispense Auth. Provider   ketorolac (TORADOL) 10 MG tablet Take 1 tablet (10 mg total) by mouth every 6 (six) hours as needed. 20 tablet Margarette Canada, NP     PDMP not reviewed this encounter.   Margarette Canada, NP 01/24/21 [phone]

## 2021-01-24 NOTE — ED Triage Notes (Signed)
Patient states she has a spot on her chest that she has had for 15 years. She states the area will flare up when she overdoes it. She states she gets sharp pains in that area and states she does become short of breath.

## 2021-01-24 NOTE — Discharge Instructions (Addendum)
Your chest x-ray today did not demonstrate any abnormalities to your skeletal structure in your chest wall.  Use the Toradol every 6 hours with food as needed for pain.  Apply moist heat to your chest wall for 20 minutes at a time 2-3 times a day.  Return for reevaluation, or see your primary care provider, for any new or worsening symptoms.

## 2021-01-24 NOTE — Telephone Encounter (Signed)
I contacted the patient and she informed me that she was headed to the urgent care to address her new onset SOB. I advise the patient that this was a good idea.

## 2021-02-07 ENCOUNTER — Emergency Department: Payer: Medicaid Other

## 2021-02-07 ENCOUNTER — Other Ambulatory Visit: Payer: Self-pay

## 2021-02-07 ENCOUNTER — Emergency Department
Admission: EM | Admit: 2021-02-07 | Discharge: 2021-02-07 | Disposition: A | Discharge status: Home / Self Care | Payer: Medicaid Other | Attending: Emergency Medicine | Admitting: Emergency Medicine

## 2021-02-07 ENCOUNTER — Ambulatory Visit: Payer: Self-pay | Admitting: *Deleted

## 2021-02-07 DIAGNOSIS — Z7982 Long term (current) use of aspirin: Secondary | ICD-10-CM | POA: Diagnosis not present

## 2021-02-07 DIAGNOSIS — Z8616 Personal history of COVID-19: Secondary | ICD-10-CM | POA: Diagnosis not present

## 2021-02-07 DIAGNOSIS — Z87891 Personal history of nicotine dependence: Secondary | ICD-10-CM | POA: Insufficient documentation

## 2021-02-07 DIAGNOSIS — R079 Chest pain, unspecified: Secondary | ICD-10-CM | POA: Diagnosis not present

## 2021-02-07 DIAGNOSIS — R0789 Other chest pain: Secondary | ICD-10-CM | POA: Diagnosis not present

## 2021-02-07 LAB — TROPONIN I (HIGH SENSITIVITY)
Troponin I (High Sensitivity): <2 ng/L (ref <18)
Troponin I (High Sensitivity): 3 ng/L (ref <18)

## 2021-02-07 LAB — CBC
HCT: 39.8 % (ref 36.0–46.0)
Hemoglobin: 14 g/dL (ref 12.0–15.0)
MCH: 30.9 pg (ref 26.0–34.0)
MCHC: 35.2 g/dL (ref 30.0–36.0)
MCV: 87.9 fL (ref 80.0–100.0)
Platelets: 259 10*3/uL (ref 150–400)
RBC: 4.53 MIL/uL (ref 3.87–5.11)
RDW: 13.2 % (ref 11.5–15.5)
WBC: 7.3 10*3/uL (ref 4.0–10.5)
nRBC: 0 % (ref 0.0–0.2)

## 2021-02-07 LAB — BASIC METABOLIC PANEL
Anion gap: 11 (ref 5–15)
BUN: 9 mg/dL (ref 6–20)
CO2: 21 mmol/L — ABNORMAL LOW (ref 22–32)
Calcium: 9.5 mg/dL (ref 8.9–10.3)
Chloride: 105 mmol/L (ref 98–111)
Creatinine, Ser: 0.54 mg/dL (ref 0.44–1.00)
GFR, Estimated: >60 mL/min (ref >60)
Glucose, Bld: 83 mg/dL (ref 70–99)
Potassium: 3.3 mmol/L — ABNORMAL LOW (ref 3.5–5.1)
Sodium: 137 mmol/L (ref 135–145)

## 2021-02-07 LAB — D-DIMER, QUANTITATIVE: D-Dimer, Quant: 0.29 ug/mL-FEU (ref 0.00–0.50)

## 2021-02-07 MED ORDER — KETOROLAC TROMETHAMINE 10 MG PO TABS: 10.0000 mg | ORAL_TABLET | Freq: Four times a day (QID) | ORAL | 0 refills | Status: AC

## 2021-02-07 NOTE — ED Triage Notes (Signed)
Reports chest pressure to center of chest that started last night and has continued into today. Pt reports recent dx of costochondritis and stopped taking her antiinflammatories recently. Pt alert and oriented X4, cooperative, RR even and unlabored, color WNL. Pt in NAD. PCP advised pt come.

## 2021-02-07 NOTE — ED Notes (Signed)
Pt asked if she could drink some of her water she has with her. ER provider notified and said it was okay for pt to drink her water.

## 2021-02-07 NOTE — ED Provider Notes (Signed)
[facility] Emergency Department Provider Note   ____________________________________________   Event Date/Time   First MD Initiated Contact with Patient 02/07/21 1343     (approximate)  I have reviewed the triage vital signs and the nursing notes.   HISTORY  Chief Complaint Chest Pain    HPI April Fleming is a 20 y.o. female with a stated past medical history of anxiety who presents for central chest pressure that began yesterday and is described as an aching/burning, 7/10, nonradiating, and worsens with standing and walking.  Patient states that mildly improves while she is laying flat.  Patient states that she was recently diagnosed with costochondritis and of the left upper chest wall however this pain has now migrated into the lower substernal region.  Patient has stopped taking her anti-inflammatories recently for this recently diagnosed costochondritis.  Patient denies any associated headache, shortness of breath, dyspnea on exertion, orthopnea, syncope, or nausea/vomiting/diarrhea         Past Medical History:  Diagnosis Date  . Abdominal pain   . COVID-19 12/23/2020  . Vomiting     Patient Active Problem List   Diagnosis Date Noted  . COVID-19 01/05/2021  . Cough 12/13/2020  . Vasovagal syncope 09/01/2020  . Anxiety 09/01/2020  . Post concussion syndrome 07/05/2020  . Heart murmur 07/05/2020  . Intractable migraine without aura and without status migrainosus 06/17/2018  . Temporomandibular joint disorder (TMJ) 06/17/2018  . Abdominal pain   . Vomiting     Past Surgical History:  Procedure Laterality Date  . WISDOM TOOTH EXTRACTION      Prior to Admission medications   Medication Sig Start Date End Date Taking? Authorizing Provider  acetaminophen (TYLENOL) 500 MG tablet Take 500 mg by mouth every 6 (six) hours as needed.    [provider]  albuterol (VENTOLIN HFA) 108 (90 Base) MCG/ACT inhaler Inhale 2 puffs into  the lungs every 6 (six) hours as needed for wheezing or shortness of breath. 01/05/21   Malfi, Lupita Raider, FNP  aspirin-acetaminophen-caffeine (EXCEDRIN MIGRAINE) [phone] MG tablet Take by mouth every 6 (six) hours as needed for headache.    [provider]  CRYSELLE-28 0.3-30 MG-MCG tablet TAKE 1 TABLET BY MOUTH ONCE DAILY 01/01/21   Parks Ranger, Devonne Doughty, DO  dicyclomine (BENTYL) 10 MG capsule Take 10 mg by mouth in the morning, at noon, in the evening, and at bedtime. Patient not taking: Reported on 01/05/2021    [provider]  dicyclomine (BENTYL) 10 MG capsule Take 10 mg by mouth 3 (three) times daily as needed for spasms.    [provider]  ketorolac (TORADOL) 10 MG tablet Take 1 tablet (10 mg total) by mouth every 6 (six) hours as needed. 01/24/21   Margarette Canada, NP  Peppermint Oil 90 MG CPCR Take 1 capsule by mouth 3 (three) times daily as needed. 03/23/20   [name], Devonne Doughty, DO    Allergies Fluoxetine  Family History  Problem Relation Age of Onset  . COPD Mother   . Melanoma Father   . Melanoma Maternal Grandmother   . Healthy Maternal Grandfather   . Dementia Paternal Grandmother   . Healthy Other   . Healthy Other     Social History Social History   Tobacco Use  . Smoking status: Never Smoker  . Smokeless tobacco: Never Used  Vaping Use  . Vaping Use: Former  . Quit date: 05/12/2019  . Substances: Nicotine, Flavoring  . Devices: self-filled vaporizer  Substance Use Topics  . Alcohol use: Never  . Drug use: Never    Review of Systems Constitutional: No fever/chills Eyes: No visual changes. ENT: No sore throat. Cardiovascular: Endorses chest pain. Respiratory: Denies shortness of breath. Gastrointestinal: No abdominal pain.  No nausea, no vomiting.  No diarrhea. Genitourinary: Negative for dysuria. Musculoskeletal: Negative for acute arthralgias Skin: Negative for rash. Neurological: Negative for headaches,  weakness/numbness/paresthesias in any extremity Psychiatric: Negative for suicidal ideation/homicidal ideation   ____________________________________________   PHYSICAL EXAM:  VITAL SIGNS: ED Triage Vitals [02/07/21 1317]  Enc Vitals Group     BP (!) 145/79     Pulse Rate 86     Resp 16     Temp 97.8 F (36.6 C)     Temp Source Oral     SpO2 100 %     Weight 125 lb (56.7 kg)     Height 5\' 4"  (1.626 m)     Head Circumference      Peak Flow      Pain Score 7     Pain Loc      Pain Edu?      Excl. in [place]?    Constitutional: Alert and oriented. Well appearing and in no acute distress. Eyes: Conjunctivae are normal. PERRL. Head: Atraumatic. Nose: No congestion/rhinnorhea. Mouth/Throat: Mucous membranes are moist. Neck: No stridor Cardiovascular: 3/6 systolic murmur.  No arrhythmia.  Good peripheral Lupus circulation.  Bedside ultrasound shows normal cardiac wall motion without pericardial free fluid or peripheral enhancement of the pericardium Respiratory: Normal respiratory effort.  No retractions. Gastrointestinal: Soft and nontender. No distention. Musculoskeletal: No obvious deformities Neurologic:  Normal speech and language. No gross focal neurologic deficits are appreciated. Skin:  Skin is warm and dry. No rash noted. Psychiatric: Mood and affect are normal. Speech and behavior are normal.  ____________________________________________   LABS (all labs ordered are listed, but only abnormal results are displayed)  Labs Reviewed  BASIC METABOLIC PANEL - Abnormal; Notable for the following components:      Result Value   Potassium 3.3 (*)    CO2 21 (*)    All other components within normal limits  CBC  D-DIMER, QUANTITATIVE (NOT AT [facility])  POC URINE PREG, ED  TROPONIN I (HIGH SENSITIVITY)  TROPONIN I (HIGH SENSITIVITY)   ____________________________________________  EKG  ED ECG REPORT I, Naaman Plummer, the attending physician, personally viewed and  interpreted this ECG.  Date: 02/07/2021 EKG Time: 1304 Rate: 90 Rhythm: normal sinus rhythm QRS Axis: normal Intervals: normal ST/T Wave abnormalities: normal Narrative Interpretation: no evidence of acute ischemia  ____________________________________________  RADIOLOGY  ED MD interpretation: 2 view chest x-ray shows no evidence of acute abnormalities including no pneumonia, pneumothorax, or widened mediastinum  Official radiology report(s): DG Chest 2 View  Result Date: 02/07/2021 CLINICAL DATA:  Chest pain. EXAM: CHEST - 2 VIEW COMPARISON:  01/24/2021. FINDINGS: Mediastinum and hilar structures normal. Heart size normal. No focal infiltrate. No pleural effusion or pneumothorax. Mild thoracic spine scoliosis. No acute bony abnormality. IMPRESSION: No acute cardiopulmonary disease. Electronically Signed   By: Marcello Moores  Register   On: 02/07/2021 13:59    ____________________________________________  PROCEDURES  Procedure(s) performed (including Critical Care):  .1-3 Lead EKG Interpretation Performed by: Naaman Plummer, MD Authorized by: Naaman Plummer, MD     Interpretation: normal     ECG rate:  91   ECG rate assessment: normal     Rhythm: sinus rhythm     Ectopy: none  Conduction: normal       ____________________________________________   INITIAL IMPRESSION / ASSESSMENT AND PLAN / ED COURSE  As part of my medical decision making, I reviewed the following data within the [facility] notes reviewed and incorporated, Labs reviewed, EKG interpreted, Old chart reviewed, Radiograph reviewed and Notes from prior ED visits reviewed and incorporated        Workup: ECG, CXR, CBC, BMP, Troponin Findings: ECG: No overt evidence of STEMI. No evidence of Brugadas sign, delta wave, epsilon wave, significantly prolonged QTc, or malignant arrhythmia HS Troponin: Negative x1 Other Labs unremarkable for emergent problems. CXR: Without PTX, PNA, or  widened mediastinum Last Stress Test: Never Last Heart Catheterization: Never HEART Score: 1  Given History, Exam, and Workup I have low suspicion for ACS, Pneumothorax, Pneumonia, Pulmonary Embolus, Tamponade, Aortic Dissection or other emergent problem as a cause for this presentation.   Reassesment: Prior to discharge patients pain was controlled and they were well appearing.  Disposition:  Discharge. Strict return precautions discussed with patient with full understanding. Advised patient to follow up promptly with primary care provider      ____________________________________________   FINAL CLINICAL IMPRESSION(S) / ED DIAGNOSES  Final diagnoses:  Chest pain, unspecified type     ED Discharge Orders    None       Note:  This document was prepared using Dragon voice recognition software and may include unintentional dictation errors.   Naaman Plummer, MD 02/07/21 [phone]

## 2021-02-07 NOTE — Telephone Encounter (Signed)
Patient was taking medication for anxiety- she thought she had reaction and she stopped it. Patient states she had pounding chest pain with pressure that woke her last night.She states she still feels a heaviness in her chest today.  Patient states she did have COVID 1 1/2 months ago. Patient reports no additional stress that would make her anxiety level increase. Pulse-101 Patient does report she has some pain with deep breath. Advised ED for evaluation.  Reason for Disposition . Taking a deep breath makes pain worse  Answer Assessment - Initial Assessment Questions 1. LOCATION: "Where does it hurt?"       Middle of chest 2. RADIATION: "Does the pain go anywhere else?" (e.g., into neck, jaw, arms, back)     no 3. ONSET: "When did the chest pain begin?" (Minutes, hours or days)      Last night 4. PATTERN "Does the pain come and go, or has it been constant since it started?"  "Does it get worse with exertion?"      Constant- out of breath with exertion- does not notice pain worse with exertion 5. DURATION: "How long does it last" (e.g., seconds, minutes, hours)     Hours- worse last night- better today 6. SEVERITY: "How bad is the pain?"  (e.g., Scale 1-10; mild, moderate, or severe)    - MILD (1-3): doesn't interfere with normal activities     - MODERATE (4-7): interferes with normal activities or awakens from sleep    - SEVERE (8-10): excruciating pain, unable to do any normal activities       Moderate- woke her up last night 7. CARDIAC RISK FACTORS: "Do you have any history of heart problems or risk factors for heart disease?" (e.g., angina, prior heart attack; diabetes, high blood pressure, high cholesterol, smoker, or strong family history of heart disease)     Past seen cardiology- syncopy 8. PULMONARY RISK FACTORS: "Do you have any history of lung disease?"  (e.g., blood clots in lung, asthma, emphysema, birth control pills)     No- chest pressure with COVID 9. CAUSE: "What do you  think is causing the chest pain?"     Unsure- anxiety  10. OTHER SYMPTOMS: "Do you have any other symptoms?" (e.g., dizziness, nausea, vomiting, sweating, fever, difficulty breathing, cough)       no 11. PREGNANCY: "Is there any chance you are pregnant?" "When was your last menstrual period?"       No- OCP- LMP- now  Protocols used: CHEST PAIN-A-AH

## 2021-02-07 NOTE — ED Notes (Signed)
Pt states coming into the ER because of chest pain. Pt states she was diagnosed with costochondritis and has a lump on the left and that is has not gone away. Pt up to the bathroom

## 2021-02-08 ENCOUNTER — Telehealth: Payer: Self-pay

## 2021-02-08 NOTE — Telephone Encounter (Signed)
Transition Care Management Unsuccessful Follow-up Telephone Call  Date of discharge and from where:  02/07/2021 from [facility]  Attempts:  1st Attempt  Reason for unsuccessful TCM follow-up call:  Left voice message

## 2021-02-09 NOTE — Telephone Encounter (Signed)
Transition Care Management Unsuccessful Follow-up Telephone Call  Date of discharge and from where:  02/07/2021 from [facility]  Attempts:  2nd Attempt  Reason for unsuccessful TCM follow-up call:  Left voice message

## 2021-02-13 ENCOUNTER — Encounter: Payer: Medicaid Other | Admitting: Unknown Physician Specialty

## 2021-02-13 ENCOUNTER — Other Ambulatory Visit: Payer: Self-pay

## 2021-02-13 DIAGNOSIS — H52223 Regular astigmatism, bilateral: Secondary | ICD-10-CM | POA: Diagnosis not present

## 2021-02-14 DIAGNOSIS — H5213 Myopia, bilateral: Secondary | ICD-10-CM | POA: Diagnosis not present

## 2021-02-15 ENCOUNTER — Encounter: Payer: Medicaid Other | Admitting: Certified Nurse Midwife

## 2021-02-22 ENCOUNTER — Other Ambulatory Visit (HOSPITAL_COMMUNITY)
Admission: RE | Admit: 2021-02-22 | Discharge: 2021-02-22 | Disposition: A | Discharge status: Home / Self Care | Payer: Medicaid Other | Source: Ambulatory Visit | Attending: Certified Nurse Midwife | Admitting: Certified Nurse Midwife

## 2021-02-22 ENCOUNTER — Encounter: Payer: Self-pay | Admitting: Certified Nurse Midwife

## 2021-02-22 ENCOUNTER — Other Ambulatory Visit: Payer: Self-pay

## 2021-02-22 ENCOUNTER — Ambulatory Visit (INDEPENDENT_AMBULATORY_CARE_PROVIDER_SITE_OTHER): Payer: Medicaid Other | Admitting: Certified Nurse Midwife

## 2021-02-22 VITALS — BP 138/72 | HR 100 | Resp 16 | Ht 64.0 in | Wt 126.9 lb

## 2021-02-22 DIAGNOSIS — Z01419 Encounter for gynecological examination (general) (routine) without abnormal findings: Secondary | ICD-10-CM

## 2021-02-22 DIAGNOSIS — Z124 Encounter for screening for malignant neoplasm of cervix: Secondary | ICD-10-CM | POA: Diagnosis not present

## 2021-02-22 NOTE — Patient Instructions (Signed)
Pap Test Why am I having this test? A Pap test, also called a Pap smear, is a screening test to check for signs of:  Cancer of the vagina, cervix, and uterus. The cervix is the lower part of the uterus that opens into the vagina.  Infection.  Changes that may be a sign that cancer is developing (precancerous changes). Women need this test on a regular basis. In general, you should have a Pap test every 3 years until you reach menopause or age 21. Women aged 30-60 may choose to have their Pap test done at the same time as an HPV (human papillomavirus) test every 5 years (instead of every 3 years). Your health care provider may recommend having Pap tests more or less often depending on your medical conditions and past Pap test results. What kind of sample is taken? Your health care provider will collect a sample of cells from the surface of your cervix. This will be done using a small cotton swab, plastic spatula, or brush. This sample is often collected during a pelvic exam, when you are lying on your back on an exam table with feet in footrests (stirrups). In some cases, fluids (secretions) from the cervix or vagina may also be collected.   How do I prepare for this test?  Be aware of where you are in your menstrual cycle. If you are menstruating on the day of the test, you may be asked to reschedule.  You may need to reschedule if you have a known vaginal infection on the day of the test.  Follow instructions from your health care provider about: ? Changing or stopping your regular medicines. Some medicines can cause abnormal test results, such as digitalis and tetracycline. ? Avoiding douching or taking a bath the day before or the day of the test. Tell a health care provider about:  Any allergies you have.  All medicines you are taking, including vitamins, herbs, eye drops, creams, and over-the-counter medicines.  Any blood disorders you have.  Any surgeries you have had.  Any  medical conditions you have.  Whether you are pregnant or may be pregnant. How are the results reported? Your test results will be reported as either abnormal or normal. A false-positive result can occur. A false positive is incorrect because it means that a condition is present when it is not. A false-negative result can occur. A false negative is incorrect because it means that a condition is not present when it is. What do the results mean? A normal test result means that you do not have signs of cancer of the vagina, cervix, or uterus. An abnormal result may mean that you have:  Cancer. A Pap test by itself is not enough to diagnose cancer. You will have more tests done in this case.  Precancerous changes in your vagina, cervix, or uterus.  Inflammation of the cervix.  An STD (sexually transmitted disease).  A fungal infection.  A parasite infection. Talk with your health care provider about what your results mean. Questions to ask your health care provider Ask your health care provider, or the department that is doing the test:  When will my results be ready?  How will I get my results?  What are my treatment options?  What other tests do I need?  What are my next steps? Summary  In general, women should have a Pap test every 3 years until they reach menopause or age 29.  Your health care provider will collect  secretions) from the cervix or vagina may also be collected. This information is not intended to replace advice given to you by your health care provider. Make sure you discuss any questions you have with your health care provider. Document Revised: 08/16/2020 Document Reviewed: 08/11/2020 Elsevier Patient Education  2021 Elsevier Inc.   Preventive Care 21-39 Years Old, Female Preventive care refers to lifestyle choices and visits with your health care provider  that can promote health and wellness. This includes: A yearly physical exam. This is also called an annual wellness visit. Regular dental and eye exams. Immunizations. Screening for certain conditions. Healthy lifestyle choices, such as: Eating a healthy diet. Getting regular exercise. Not using drugs or products that contain nicotine and tobacco. Limiting alcohol use. What can I expect for my preventive care visit? Physical exam Your health care provider may check your: Height and weight. These may be used to calculate your BMI (body mass index). BMI is a measurement that tells if you are at a healthy weight. Heart rate and blood pressure. Body temperature. Skin for abnormal spots. Counseling Your health care provider may ask you questions about your: Past medical problems. Family's medical history. Alcohol, tobacco, and drug use. Emotional well-being. Home life and relationship well-being. Sexual activity. Diet, exercise, and sleep habits. Work and work environment. Access to firearms. Method of birth control. Menstrual cycle. Pregnancy history. What immunizations do I need? Vaccines are usually given at various ages, according to a schedule. Your health care provider will recommend vaccines for you based on your age, medical history, and lifestyle or other factors, such as travel or where you work.   What tests do I need? Blood tests Lipid and cholesterol levels. These may be checked every 5 years starting at age 20. Hepatitis C test. Hepatitis B test. Screening Diabetes screening. This is done by checking your blood sugar (glucose) after you have not eaten for a while (fasting). STD (sexually transmitted disease) testing, if you are at risk. BRCA-related cancer screening. This may be done if you have a family history of breast, ovarian, tubal, or peritoneal cancers. Pelvic exam and Pap test. This may be done every 3 years starting at age 21. Starting at age 30, this may  be done every 5 years if you have a Pap test in combination with an HPV test. Talk with your health care provider about your test results, treatment options, and if necessary, the need for more tests.   Follow these instructions at home: Eating and drinking Eat a healthy diet that includes fresh fruits and vegetables, whole grains, lean protein, and low-fat dairy products. Take vitamin and mineral supplements as recommended by your health care provider. Do not drink alcohol if: Your health care provider tells you not to drink. You are pregnant, may be pregnant, or are planning to become pregnant. If you drink alcohol: Limit how much you have to 0-1 drink a day. Be aware of how much alcohol is in your drink. In the [place] one drink equals one 12 oz bottle of beer (355 mL), one 5 oz glass of wine (148 mL), or one 1 oz glass of hard liquor (44 mL).   Lifestyle Take daily care of your teeth and gums. Brush your teeth every morning and night with fluoride toothpaste. Floss one time each day. Stay active. Exercise for at least 30 minutes 5 or more days each week. Do not use any products that contain nicotine or tobacco, such as cigarettes, e-cigarettes, and chewing tobacco. If   you need help quitting, ask your health care provider. Do not use drugs. If you are sexually active, practice safe sex. Use a condom or other form of protection to prevent STIs (sexually transmitted infections). If you do not wish to become pregnant, use a form of birth control. If you plan to become pregnant, see your health care provider for a prepregnancy visit. Find healthy ways to cope with stress, such as: Meditation, yoga, or listening to music. Journaling. Talking to a trusted person. Spending time with friends and family. Safety Always wear your seat belt while driving or riding in a vehicle. Do not drive: If you have been drinking alcohol. Do not ride with someone who has been drinking. When you are tired or  distracted. While texting. Wear a helmet and other protective equipment during sports activities. If you have firearms in your house, make sure you follow all gun safety procedures. Seek help if you have been physically or sexually abused. What's next? Go to your health care provider once a year for an annual wellness visit. Ask your health care provider how often you should have your eyes and teeth checked. Stay up to date on all vaccines. This information is not intended to replace advice given to you by your health care provider. Make sure you discuss any questions you have with your health care provider. Document Revised: 08/06/2020 Document Reviewed: 08/20/2018 Elsevier Patient Education  2021 Elsevier Inc.  

## 2021-02-22 NOTE — Progress Notes (Addendum)
ANNUAL PREVENTATIVE CARE GYN  ENCOUNTER NOTE  Subjective:       April Fleming is a 20 y.o. No obstetric history on file. female here for a routine annual gynecologic exam.  Current complaints: 1. Needs Pap smear-within 30 days of 20st birthday  Denies difficulty breathing or respiratory distress, chest pain, abdominal pain, excessive vaginal bleeding, dysuria, and leg pain or swelling.   PCP: Rachel Moulds, DO   Gynecologic History  Patient's last menstrual period was 02/06/2021 (approximate).  Contraception: OCP (estrogen/progesterone), Crystelle-28  Last Pap: due   Obstetric History  OB History  Gravida Para Term Preterm AB Living  0 0 0 0 0 0  SAB IAB Ectopic Multiple Live Births  0 0 0 0 0    Past Medical History:  Diagnosis Date  . Abdominal pain   . COVID-19 12/23/2020  . Vomiting     Past Surgical History:  Procedure Laterality Date  . WISDOM TOOTH EXTRACTION      Current Outpatient Medications on File Prior to Visit  Medication Sig Dispense Refill  . acetaminophen (TYLENOL) 500 MG tablet Take 500 mg by mouth every 6 (six) hours as needed.    Marland Kitchen albuterol (VENTOLIN HFA) 108 (90 Base) MCG/ACT inhaler Inhale 2 puffs into the lungs every 6 (six) hours as needed for wheezing or shortness of breath. 8 g 0  . aspirin-acetaminophen-caffeine (EXCEDRIN MIGRAINE) 353-614-43 MG tablet Take by mouth every 6 (six) hours as needed for headache.    . CRYSELLE-28 0.3-30 MG-MCG tablet TAKE 1 TABLET BY MOUTH ONCE DAILY 84 tablet 0  . dicyclomine (BENTYL) 10 MG capsule Take 10 mg by mouth in the morning, at noon, in the evening, and at bedtime.    . dicyclomine (BENTYL) 10 MG capsule Take 10 mg by mouth 3 (three) times daily as needed for spasms.    Marland Kitchen ketorolac (TORADOL) 10 MG tablet Take 1 tablet (10 mg total) by mouth every 6 (six) hours as needed. 20 tablet 0  . Peppermint Oil 90 MG CPCR Take 1 capsule by mouth 3 (three) times daily as needed.     No current  facility-administered medications on file prior to visit.    Allergies  Allergen Reactions  . Fluoxetine Other (See Comments)    Chest pain    Social History   Socioeconomic History  . Marital status: Single    Spouse name: Not on file  . Number of children: Not on file  . Years of education: Not on file  . Highest education level: High school graduate  Occupational History  . Not on file  Tobacco Use  . Smoking status: Never Smoker  . Smokeless tobacco: Never Used  Vaping Use  . Vaping Use: Former  . Quit date: 05/12/2019  . Substances: Nicotine, Flavoring  . Devices: self-filled vaporizer  Substance and Sexual Activity  . Alcohol use: Never  . Drug use: Never  . Sexual activity: Yes    Birth control/protection: Pill  Other Topics Concern  . Not on file  Social History Narrative  . Not on file   Social Determinants of Health   Financial Resource Strain: Not on file  Food Insecurity: Not on file  Transportation Needs: Not on file  Physical Activity: Not on file  Stress: Not on file  Social Connections: Not on file  Intimate Partner Violence: Not on file    Family History  Problem Relation Age of Onset  . COPD Mother   . Melanoma Father   .  Melanoma Maternal Grandmother   . Healthy Maternal Grandfather   . Dementia Paternal Grandmother   . Healthy Other   . Healthy Other     The following portions of the patient's history were reviewed and updated as appropriate: allergies, current medications, past family history, past medical history, past social history, past surgical history and problem list.  Review of Systems  ROS negative except as noted above. Information obtained from patient.    Objective:   BP (!) 142/101   Pulse 100   Resp 16   Ht 5\' 4"  (1.626 m)   Wt 126 lb 14.4 oz (57.6 kg)   LMP 02/06/2021 (Approximate)   BMI 21.78 kg/m    CONSTITUTIONAL: Well-developed, well-nourished female in no acute distress.   PSYCHIATRIC: Normal mood  and affect. Normal behavior. Normal judgment and thought content. [place]: Alert and oriented to person, place, and time. Normal muscle tone coordination. No cranial  nerve deficit noted.  HENT:  Normocephalic, atraumatic.  EYES: Conjunctivae and EOM are normal.    NECK: Normal range of motion, supple, no masses.  Normal thyroid.   SKIN: Skin is warm and dry. No rash noted. Not diaphoretic. No erythema. No pallor. Professional tattoos present.   CARDIOVASCULAR: Normal heart rate noted, regular rhythm, no murmur.  RESPIRATORY: Clear to auscultation bilaterally. Effort and breath sounds normal, no problems with respiration noted.  BREASTS: Symmetric in size. No masses, skin changes, nipple drainage, or lymphadenopathy.  ABDOMEN: Soft, normal bowel sounds, no distention noted.  No tenderness, rebound or guarding. Navel ring present.   PELVIC:  External Genitalia: Normal  Vagina: Normal  Cervix: Normal, Pap collected  Uterus: Normal  Adnexa: Normal  MUSCULOSKELETAL: Normal range of motion. No tenderness.  No cyanosis, clubbing, or edema.  2+ distal pulses.  LYMPHATIC: No Axillary, Supraclavicular, or Inguinal Adenopathy.  Assessment:   Annual gynecologic examination 20 y.o.   Contraception: OCP (estrogen/progesterone), managed by PCP   Normal BMI   Problem List Items Addressed This Visit   None   Visit Diagnoses    Well woman exam    -  Primary   Screening for cervical cancer          Plan:   Pap: Pap, Reflex if ASCUS; declines STI testing  Labs: Managed by PCP  Routine preventative health maintenance measures emphasized: Exercise/Diet/Weight control, Tobacco Warnings, Alcohol/Substance use risks, Stress Management, Peer Pressure Issues and Safe Sex; see AVS  Reviewed red flag symptoms and when to call  Return to [place] for [employer] or sooner if needed   Dani Gobble, CNM  [facility], [facility] 02/22/21 2:36 PM

## 2021-02-28 LAB — CYTOLOGY - PAP: Diagnosis: NEGATIVE

## 2021-03-02 ENCOUNTER — Ambulatory Visit (INDEPENDENT_AMBULATORY_CARE_PROVIDER_SITE_OTHER): Payer: Medicaid Other | Admitting: Family Medicine

## 2021-03-02 ENCOUNTER — Encounter: Payer: Self-pay | Admitting: Family Medicine

## 2021-03-02 ENCOUNTER — Other Ambulatory Visit: Payer: Self-pay

## 2021-03-02 VITALS — BP 129/81 | HR 110 | Ht 64.0 in | Wt 126.6 lb

## 2021-03-02 DIAGNOSIS — R0789 Other chest pain: Secondary | ICD-10-CM

## 2021-03-02 DIAGNOSIS — F419 Anxiety disorder, unspecified: Secondary | ICD-10-CM | POA: Diagnosis not present

## 2021-03-02 DIAGNOSIS — G8929 Other chronic pain: Secondary | ICD-10-CM

## 2021-03-02 DIAGNOSIS — M940 Chondrocostal junction syndrome [Tietze]: Secondary | ICD-10-CM | POA: Diagnosis not present

## 2021-03-02 MED ORDER — DULOXETINE HCL 20 MG PO CPEP: 20.0000 mg | ORAL_CAPSULE | Freq: Every day | ORAL | 2 refills | Status: DC

## 2021-03-02 MED ORDER — BACLOFEN 10 MG PO TABS: 5.0000 mg | ORAL_TABLET | Freq: Three times a day (TID) | ORAL | 2 refills | Status: DC | PRN

## 2021-03-02 NOTE — Patient Instructions (Addendum)
Thank you for coming to the office today.  Referral to [facility] stay tuned for apt.  [place], [place]  [phone]  Start taking Baclofen (Lioresal) 10mg  (muscle relaxant) - start with half (cut) to one whole pill at night as needed for next 1-3 nights (may make you drowsy, caution with driving) see how it affects you, then if tolerated increase to one pill 2 to 3 times a day or (every 8 hours as needed)  When ready may also start Duloxetine (Cymbalta) 20mg  daily, may take 3-4 weeks to take full effect.  Please schedule a Follow-up Appointment to: Return in about 3 months (around 06/02/2021) for 3 month re-scheduled Annual Physical in AM (fasting lab AFTER).  If you have any other questions or concerns, please feel free to call the office or send a message through [place]. You may also schedule an earlier appointment if necessary.  Additionally, you may be receiving a survey about your experience at our office within a few days to 1 week by e-mail or mail. We value your feedback.  Nobie Putnam, DO [place]
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
## 2021-03-24 ENCOUNTER — Other Ambulatory Visit: Payer: Self-pay | Admitting: Family Medicine

## 2021-03-24 DIAGNOSIS — Z3041 Encounter for surveillance of contraceptive pills: Secondary | ICD-10-CM

## 2021-03-24 NOTE — Telephone Encounter (Signed)
Requested Prescriptions  Pending Prescriptions Disp Refills  . CRYSELLE-28 0.3-30 MG-MCG tablet [Pharmacy Med Name: EXBMWUXL-24 0.3-30 MG-MCG TAB] 84 tablet 0    Sig: TAKE 1 TABLET BY MOUTH ONCE DAILY     OB/GYN:  Contraceptives Passed - 03/24/2021  9:01 AM      Passed - Last BP in normal range    BP Readings from Last 1 Encounters:  03/02/21 129/81         Passed - Valid encounter within last 12 months    Recent Outpatient Visits          3 weeks ago [place], DO   2 months ago [facility] [place], Lupita Raider, FNP   3 months ago [place]   [facility], Lupita Raider, FNP   6 months ago [facility] [place], Lupita Raider, FNP   6 months ago [facility] [place], Lupita Raider, FNP      Future Appointments            In 2 months [place], Devonne Doughty, [facility], [place]   In 11 months [place], Lara Mulch, CNM [facility]

## 2021-04-21 ENCOUNTER — Encounter: Payer: Self-pay | Admitting: Emergency Medicine

## 2021-04-21 ENCOUNTER — Ambulatory Visit
Admission: EM | Admit: 2021-04-21 | Discharge: 2021-04-21 | Disposition: A | Discharge status: Home / Self Care | Payer: Medicaid Other

## 2021-04-21 ENCOUNTER — Other Ambulatory Visit: Payer: Self-pay

## 2021-04-21 DIAGNOSIS — M542 Cervicalgia: Secondary | ICD-10-CM

## 2021-04-21 DIAGNOSIS — R0981 Nasal congestion: Secondary | ICD-10-CM | POA: Diagnosis not present

## 2021-04-21 NOTE — ED Provider Notes (Signed)
[facility]    CSN: [id number] Arrival date & time: 04/21/21  0802      History   Chief Complaint Chief Complaint  Patient presents with  . Neck Pain    HPI April Fleming is a 20 y.o. female presenting for a small swollen lump of the left posterior neck.  She states that she has had a swollen lump ear for the past 5 years but it has not been painful.  She has over the past week its been painful.  Also admits to some sinus congestion and postnasal drainage over the past week as well.  No fevers, cough, sore throats, painful swallowing, ear pain, headaches.  Patient states that she has seen dermatology before for the swollen lymph and they told her that it was related to scalp issues.  She denies any other areas of swelling.  She does have increased pain when she turns her neck.  She says it is constant and aching and did wake her up from her sleep last night.  She has not taken any medication for the discomfort.  Patient denies any sort of injury to her neck.  Past medical history significant for chronic chest pain, anxiety, and TMJ disorder.  Patient has no other concerns today.  HPI  Past Medical History:  Diagnosis Date  . Abdominal pain   . COVID-19 12/23/2020  . Vomiting     Patient Active Problem List   Diagnosis Date Noted  . COVID-19 01/05/2021  . Cough 12/13/2020  . Vasovagal syncope 09/01/2020  . Anxiety 09/01/2020  . Post concussion syndrome 07/05/2020  . Heart murmur 07/05/2020  . Intractable migraine without aura and without status migrainosus 06/17/2018  . Temporomandibular joint disorder (TMJ) 06/17/2018  . Abdominal pain   . Vomiting     Past Surgical History:  Procedure Laterality Date  . WISDOM TOOTH EXTRACTION      OB History    Gravida  0   Para  0   Term  0   Preterm  0   AB  0   Living  0     SAB  0   IAB  0   Ectopic  0   Multiple  0   Live Births  0            Home Medications    Prior to Admission  medications   Medication Sig Start Date End Date Taking? Authorizing Provider  CRYSELLE-28 0.3-30 MG-MCG tablet TAKE 1 TABLET BY MOUTH ONCE DAILY 03/24/21  Yes [name], Devonne Doughty, DO  acetaminophen (TYLENOL) 500 MG tablet Take 500 mg by mouth every 6 (six) hours as needed.    [provider]  albuterol (VENTOLIN HFA) 108 (90 Base) MCG/ACT inhaler Inhale 2 puffs into the lungs every 6 (six) hours as needed for wheezing or shortness of breath. 01/05/21   M[place]i, Lupita Raider, FNP  aspirin-acetaminophen-caffeine (EXCEDRIN MIGRAINE) 2621839240 MG tablet Take by mouth every 6 (six) hours as needed for headache.    [provider]  baclofen (LIORESAL) 10 MG tablet Take 0.5-1 tablets (5-10 mg total) by mouth 3 (three) times daily as needed for muscle spasms. 03/02/21   [name], Devonne Doughty, DO  dicyclomine (BENTYL) 10 MG capsule Take 10 mg by mouth in the morning, at noon, in the evening, and at bedtime.    [provider]  dicyclomine (BENTYL) 10 MG capsule Take 10 mg by mouth 3 (three) times daily as needed for spasms.  [provider]  DULoxetine (CYMBALTA) 20 MG capsule Take 1 capsule (20 mg total) by mouth daily. 03/02/21   [name], Devonne Doughty, DO  ketorolac (TORADOL) 10 MG tablet Take 1 tablet (10 mg total) by mouth every 6 (six) hours as needed. 01/24/21   Margarette Canada, NP  Peppermint Oil 90 MG CPCR Take 1 capsule by mouth 3 (three) times daily as needed. 03/23/20   Olin Hauser, DO    Family History Family History  Problem Relation Age of Onset  . COPD Mother   . Melanoma Father   . Melanoma Maternal Grandmother   . Healthy Maternal Grandfather   . Dementia Paternal Grandmother   . Healthy Other   . Healthy Other     Social History Social History   Tobacco Use  . Smoking status: Never Smoker  . Smokeless tobacco: Never Used  Vaping Use  . Vaping Use: Former  . Quit date: 05/12/2019  . Substances: Nicotine, Flavoring  . Devices:  self-filled vaporizer  Substance Use Topics  . Alcohol use: Never  . Drug use: Never     Allergies   Patient has no known allergies.   Review of Systems Review of Systems  Constitutional: Negative for chills, diaphoresis, fatigue and fever.  HENT: Positive for congestion, postnasal drip and rhinorrhea. Negative for ear pain and sore throat.   Respiratory: Negative for cough and shortness of breath.   Gastrointestinal: Negative for abdominal pain, nausea and vomiting.  Musculoskeletal: Positive for neck pain. Negative for arthralgias, myalgias and neck stiffness.  Skin: Negative for rash.  Neurological: Negative for dizziness, weakness and headaches.  Hematological: Positive for adenopathy.     Physical Exam Triage Vital Signs ED Triage Vitals  Enc Vitals Group     BP --      Pulse --      Resp --      Temp --      Temp src --      SpO2 --      Weight 04/21/21 0825 125 lb (56.7 kg)     Height 04/21/21 0825 5\' 3"  (1.6 m)     Head Circumference --      Peak Flow --      Pain Score 04/21/21 0824 6     Pain Loc --      Pain Edu? --      Excl. in [place]? --    No data found.  Updated Vital Signs BP (!) 138/92 (BP Location: Left Arm)   Pulse 82   Temp 98.4 F (36.9 C) (Oral)   Resp 14   Ht 5\' 3"  (1.6 m)   Wt 125 lb (56.7 kg)   LMP 04/07/2021 (Approximate)   SpO2 100%   BMI 22.14 kg/m       Physical Exam Vitals and nursing note reviewed.  Constitutional:      General: She is not in acute distress.    Appearance: Normal appearance. She is not ill-appearing or toxic-appearing.  HENT:     Head: Normocephalic and atraumatic.     Right Ear: Tympanic membrane, ear canal and external ear normal.     Left Ear: Tympanic membrane, ear canal and external ear normal.     Nose: Nose normal.     Mouth/Throat:     Mouth: Mucous membranes are moist.     Pharynx: Oropharynx is clear.     Comments: Clear PND Eyes:     General: No scleral icterus.  Right eye: No  discharge.        Left eye: No discharge.     Conjunctiva/sclera: Conjunctivae normal.  Cardiovascular:     Rate and Rhythm: Normal rate and regular rhythm.     Heart sounds: Normal heart sounds.  Pulmonary:     Effort: Pulmonary effort is normal. No respiratory distress.     Breath sounds: Normal breath sounds. No wheezing, rhonchi or rales.  Musculoskeletal:     Cervical back: Normal range of motion and neck supple. Tenderness (There is a very small firm immobile mass of the left posterior neck which could represent occipital lymph node or tiny muscle spasm. Area is tender. No other area of swelling and no obvious lymphadenopathy elsewhere) present. No rigidity.  Skin:    General: Skin is dry.  Neurological:     General: No focal deficit present.     Mental Status: She is alert. Mental status is at baseline.     Motor: No weakness.     Gait: Gait normal.  Psychiatric:        Mood and Affect: Mood normal.        Behavior: Behavior normal.        Thought Content: Thought content normal.      UC Treatments / Results  Labs (all labs ordered are listed, but only abnormal results are displayed) Labs Reviewed - No data to display  EKG   Radiology No results found.  Procedures Procedures (including critical care time)  Medications Ordered in UC Medications - No data to display  Initial Impression / Assessment and Plan / UC Course  I have reviewed the triage vital signs and the nursing notes.  Pertinent labs & imaging results that were available during my care of the patient were reviewed by me and considered in my medical decision making (see chart for details).   20 year old female presenting for concerns about a swollen lymph node of the left posterior neck.  Patient does report that it has been swollen for about 5 years but just recently became painful whenever she started to have some congestion.  She is denying any COVID exposure.  Congestion symptoms are  improving.  Exam today may be consistent with an occipital enlarged lymph node or tiny muscle spasm.  Other DDx does include sebaceous cyst or lipoma.  Advised patient on supportive care at this time with over-the-counter Aleve or ibuprofen and/or Tylenol.  Also advised she can try heat and ice to the area, stretches and her at home baclofen.  Advised to follow-up with PCP if not improved over the next 2 weeks or if symptoms worsen.  May need to consider ultrasound of this area or referral to dermatology or general surgery at that time.  Hopefully, the area of swelling goes down with supportive care and resolution of her URI symptoms.  Final Clinical Impressions(s) / UC Diagnoses   Final diagnoses:  Neck pain  Sinus congestion     Discharge Instructions     The knot of your neck could represent a lymph node or a muscle spasm. At this time you can try applying heat and/or ice to the area and taking Aleve or Motrin OTC. Additionally you can take your at home Baclofen. If not better in 2 weeks or worsening, make appt with PCP. If this is a persisting lymph node, they can consider ultrasound of the area at that time.    ED Prescriptions    None     PDMP not  reviewed this encounter.   Danton Clap, PA-C 04/21/21 [id number]

## 2021-04-21 NOTE — Discharge Instructions (Signed)
The knot of your neck could represent a lymph node or a muscle spasm. At this time you can try applying heat and/or ice to the area and taking Aleve or Motrin OTC. Additionally you can take your at home Baclofen. If not better in 2 weeks or worsening, make appt with PCP. If this is a persisting lymph node, they can consider ultrasound of the area at that time.

## 2021-04-21 NOTE — ED Triage Notes (Signed)
Patient c/o tender knot on the back left side of her neck that started getting worse a week ago.  Patient reports pain in her neck when she moves her neck.  Patient denies fevers.  Patient denies cold symptoms.

## 2021-04-23 ENCOUNTER — Ambulatory Visit (INDEPENDENT_AMBULATORY_CARE_PROVIDER_SITE_OTHER): Payer: Medicaid Other | Admitting: Family Medicine

## 2021-04-23 ENCOUNTER — Encounter: Payer: Self-pay | Admitting: Family Medicine

## 2021-04-23 ENCOUNTER — Other Ambulatory Visit: Payer: Self-pay

## 2021-04-23 VITALS — BP 135/75 | HR 89 | Ht 63.0 in | Wt 127.6 lb

## 2021-04-23 DIAGNOSIS — R590 Localized enlarged lymph nodes: Secondary | ICD-10-CM

## 2021-04-23 DIAGNOSIS — Z808 Family history of malignant neoplasm of other organs or systems: Secondary | ICD-10-CM | POA: Diagnosis not present

## 2021-04-23 NOTE — Patient Instructions (Addendum)
Thank you for coming to the office today.  [place]   [address], [place] [address] Hours: 8AM-5PM Phone: [phone]  Call them or [place] within 2-3 weeks to check status.  ---  Use ibuprofen / motrin 200-600mg  dose as needed every 8 hours or 3 times a day  Can use alternating ice / heat to help with pain and swelling    Please schedule a Follow-up Appointment to: Return if symptoms worsen or fail to improve, for cancel 5/5 apt, follow-up as needed.  If you have any other questions or concerns, please feel free to call the office or send a message through [place]. You may also schedule an earlier appointment if necessary.  Additionally, you may be receiving a survey about your experience at our office within a few days to 1 week by e-mail or mail. We value your feedback.  Nobie Putnam, DO [place]

## 2021-04-23 NOTE — Progress Notes (Signed)
Subjective:    Patient ID: April Fleming, female    DOB: 2001/04/25, 20 y.o.   MRN: [id number]  April Fleming is a 20 y.o. female presenting on 04/23/2021 for Adenopathy and Neck Pain   HPI   Lymphadenopathy, Neck  UC FOLLOW-UP VISIT  Hospital/Location: [facility] Date of ED Visit: 04/21/21  Reason for Presenting to ED: lymph node swelling Primary (+Secondary) Diagnosis: lymphadenopathy  FOLLOW-UP  - ED provider note and record have been reviewed - Patient presents today about 2 days after recent Urgent Care visit. Brief summary of recent course, patient had symptoms of recurrent Left occipital posterior lymph node identified within past 1+ week, increased size and swelling and pain, with burning stabbing pain. presented to Urgent Care.   - Previous history of same issue back in 2018 documented from [facility] [place] [facility], with same location of lymphadenopathy, treated with antibiotic at that time with some temporary relief. Also she has seen scalp issue, had seen Dermatologist, and they thought that was provoking lymph node previously. It may have been there at a very low level or only small faint feeling but not actually bothering her or causing pain or symptoms.  She tried Baclofen 10mg  PRN without any relief, Ibuprofen, Tylenol without improvement. She tried a heating pad temporary  Today reports the Left posterior lymph node still bothering her but has improved some.  Additional update, costochondritis has improved significantly with warmer weather, usually only bothers her on cold rainy days now.  Additional history she is interested to see Dermatology for routine screening and some abnormal moles. She has fam   Depression screen [facility] 2/9 02/22/2021 09/11/2020 09/01/2020  Decreased Interest 0 0 1  Down, Depressed, Hopeless 0 0 1  PHQ - 2 Score 0 0 2  Altered sleeping - 1 2  Tired, decreased energy - 1 2  Change in appetite - 0 1  Feeling bad or failure about yourself  -  0 0  Trouble concentrating - 0 0  Moving slowly or fidgety/restless - 1 1  Suicidal thoughts - 0 0  PHQ-9 Score - 3 8  Difficult doing work/chores - Not difficult at all Somewhat difficult    Social History   Tobacco Use  . Smoking status: Never Smoker  . Smokeless tobacco: Never Used  Vaping Use  . Vaping Use: Former  . Quit date: 05/12/2019  . Substances: Nicotine, Flavoring  . Devices: self-filled vaporizer  Substance Use Topics  . Alcohol use: Never  . Drug use: Never    Review of Systems Per HPI unless specifically indicated above     Objective:    BP 135/75   Pulse 89   Ht 5\' 3"  (1.6 m)   Wt 127 lb 9.6 oz (57.9 kg)   LMP 04/07/2021 (Approximate)   SpO2 100%   BMI 22.60 kg/m   Wt Readings from Last 3 Encounters:  04/23/21 127 lb 9.6 oz (57.9 kg)  04/21/21 125 lb (56.7 kg)  03/02/21 126 lb 9.6 oz (57.4 kg)    Physical Exam Vitals and nursing note reviewed.  Constitutional:      General: She is not in acute distress.    Appearance: She is well-developed. She is not diaphoretic.     Comments: Well-appearing, comfortable, cooperative  HENT:     Head: Normocephalic and atraumatic.  Eyes:     General:        Right eye: No discharge.        Left eye: No discharge.  Conjunctiva/sclera: Conjunctivae normal.  Cardiovascular:     Rate and Rhythm: Normal rate.  Pulmonary:     Effort: Pulmonary effort is normal.  Chest:  Breasts:     Right: No axillary adenopathy or supraclavicular adenopathy.     Left: No axillary adenopathy or supraclavicular adenopathy.    Lymphadenopathy:     Cervical: Cervical adenopathy present.     Right cervical: No superficial, deep or posterior cervical adenopathy.    Left cervical: Posterior cervical adenopathy present. No superficial or deep cervical adenopathy.     Upper Body:     Right upper body: No supraclavicular, axillary or pectoral adenopathy.     Left upper body: No supraclavicular, axillary or pectoral adenopathy.   Skin:    General: Skin is warm and dry.     Findings: No erythema or rash.  Neurological:     Mental Status: She is alert and oriented to person, place, and time.  Psychiatric:        Behavior: Behavior normal.     Comments: Well groomed, good eye contact, normal speech and thoughts    Results for orders placed or performed in visit on 02/22/21  Cytology - PAP  Result Value Ref Range   Adequacy      Satisfactory for evaluation; transformation zone component PRESENT.   Diagnosis      - Negative for Intraepithelial Lesions or Malignancy (NILM)      Assessment & Plan:   Problem List Items Addressed This Visit   None   Visit Diagnoses    LAD (lymphadenopathy), occipital    -  Primary   Relevant Orders   US Soft Tissue Head/Neck (NON-THYROID)   Family history of melanoma       Relevant Orders   Ambulatory referral to Dermatology      Clinically with localized small approx 1 cm or less mobile lymphadenopathy Left upper superior posterior cervical/occiptal region. Mild tender. Suspected to be reactive, however no clear cause - Uncertain provoking etiology this time, seems to be exact same location for prior lymphadenopathy in years past.  Recent sunburn on scalp. No obvious sinusitis. Or other skin lesion  No additional concerning symptoms for more systemic problem vs malignancy.   Plan: 1. Proceed with Neck Soft Tissue US, [facility] 2. Monitor for change, inc size, pain, swelling, other LAD, worsening symptoms, return criteria given  Consider ENT consult if abnormal on ultrasound.  #Dermatology / Fam history melanoma She has history of sun exposure, and lots of freckles, she has multiple areas of darker pigmented moles Strong fam history of melanoma with father passing from melanoma age 53s. - Will proceed w/ referral to [facility] for evaluation routine total body skin check  Orders Placed This Encounter  Procedures  . [place] Soft  Tissue Head/Neck (NON-THYROID)    Standing Status:   Future    Standing Expiration Date:   04/23/2022    Order Specific Question:   Reason for Exam (SYMPTOM  OR DIAGNOSIS REQUIRED)    Answer:   left superior occipital lymphadenopathy localized, recurrent over past 5 years    Order Specific Question:   Preferred imaging location?    Answer:   [facility]  . Ambulatory referral to Dermatology    Referral Priority:   Routine    Referral Type:   Consultation    Referral Reason:   Specialty Services Required    Requested Specialty:   Dermatology    Number of Visits Requested:  1     No orders of the defined types were placed in this encounter.     Follow up plan: Return if symptoms worsen or fail to improve, for cancel 5/5 apt, follow-up as needed.    Nobie Putnam, [facility] 04/23/2021, 2:09 PM

## 2021-04-26 ENCOUNTER — Ambulatory Visit: Payer: Medicaid Other | Admitting: Family Medicine

## 2021-05-09 ENCOUNTER — Ambulatory Visit
Admission: RE | Admit: 2021-05-09 | Discharge: 2021-05-09 | Disposition: A | Discharge status: Home / Self Care | Payer: Medicaid Other | Source: Ambulatory Visit | Attending: Family Medicine | Admitting: Family Medicine

## 2021-05-09 ENCOUNTER — Other Ambulatory Visit: Payer: Self-pay

## 2021-05-09 DIAGNOSIS — R590 Localized enlarged lymph nodes: Secondary | ICD-10-CM | POA: Insufficient documentation

## 2021-05-09 DIAGNOSIS — R599 Enlarged lymph nodes, unspecified: Secondary | ICD-10-CM | POA: Diagnosis not present

## 2021-05-10 ENCOUNTER — Other Ambulatory Visit: Payer: Self-pay | Admitting: Family Medicine

## 2021-05-10 ENCOUNTER — Telehealth: Payer: Self-pay | Admitting: Student in an Organized Health Care Education/Training Program

## 2021-05-10 ENCOUNTER — Ambulatory Visit
Payer: Medicaid Other | Attending: Student in an Organized Health Care Education/Training Program | Admitting: Student in an Organized Health Care Education/Training Program

## 2021-05-10 ENCOUNTER — Encounter: Payer: Self-pay | Admitting: Student in an Organized Health Care Education/Training Program

## 2021-05-10 VITALS — BP 146/90 | HR 92 | Temp 97.1°F | Resp 14 | Ht 64.0 in | Wt 126.0 lb

## 2021-05-10 DIAGNOSIS — G894 Chronic pain syndrome: Secondary | ICD-10-CM

## 2021-05-10 DIAGNOSIS — R0789 Other chest pain: Secondary | ICD-10-CM | POA: Diagnosis not present

## 2021-05-10 DIAGNOSIS — M940 Chondrocostal junction syndrome [Tietze]: Secondary | ICD-10-CM

## 2021-05-10 DIAGNOSIS — R590 Localized enlarged lymph nodes: Secondary | ICD-10-CM

## 2021-05-10 DIAGNOSIS — Z3041 Encounter for surveillance of contraceptive pills: Secondary | ICD-10-CM

## 2021-05-10 MED ORDER — DICLOFENAC SODIUM 50 MG PO TBEC: 50.0000 mg | DELAYED_RELEASE_TABLET | Freq: Two times a day (BID) | ORAL | 0 refills | Status: DC | PRN

## 2021-05-10 NOTE — Telephone Encounter (Signed)
Patient notified

## 2021-05-10 NOTE — Telephone Encounter (Signed)
Patient went to pharmacy and was told her insurnace does not cover the medication sent in (oral voltaren ? ) . Please advise patient

## 2021-05-10 NOTE — Telephone Encounter (Signed)
Requested Prescriptions  Pending Prescriptions Disp Refills  . CRYSELLE-28 0.3-30 MG-MCG tablet [Pharmacy Med Name: KGMWNUUV-25 0.3-30 MG-MCG TAB] 84 tablet 0    Sig: TAKE 1 TABLET BY MOUTH ONCE DAILY     OB/GYN:  Contraceptives Failed - 05/10/2021  5:43 PM      Failed - Last BP in normal range    BP Readings from Last 1 Encounters:  05/10/21 (!) 146/90         Passed - Valid encounter within last 12 months    Recent Outpatient Visits          2 weeks ago LAD (lymphadenopathy), occipital   [place], DO   2 months ago [place], DO   4 months ago [facility], Lupita Raider, FNP   4 months ago [place]   [facility], Lupita Raider, FNP   7 months ago [facility] [place], Lupita Raider, FNP      Future Appointments            In 1 month [place], Devonne Doughty, [facility], [place]   In 6 months Laurence Ferrari, [place], MD [place]   In 9 months [place], Lara Mulch, CNM [facility]
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
## 2021-05-10 NOTE — Progress Notes (Signed)
Safety precautions to be maintained throughout the outpatient stay will include: orient to surroundings, keep bed in low position, maintain call bell within reach at all times, provide assistance with transfer out of bed and ambulation.  

## 2021-05-10 NOTE — Telephone Encounter (Signed)
Do you recommend anything else?

## 2021-05-15 ENCOUNTER — Ambulatory Visit: Payer: Self-pay | Admitting: *Deleted

## 2021-05-15 DIAGNOSIS — Z20822 Contact with and (suspected) exposure to covid-19: Secondary | ICD-10-CM | POA: Diagnosis not present

## 2021-05-15 DIAGNOSIS — Z03818 Encounter for observation for suspected exposure to other biological agents ruled out: Secondary | ICD-10-CM | POA: Diagnosis not present

## 2021-05-15 NOTE — Telephone Encounter (Signed)
Pt called in c/o a "bad headache" that started about 6:00 PM yesterday evening.  She was exposed to someone with Covid on Sat.   She woke up with a sore throat and nausea this morning.   She did a rapid test and it was negative this morning.    She is waiting on the results of her PCR test that was done today. I vomited this morning also so I'm sure I have Covid. "I had Covid at the beginning of the year and I felt this same way except I did not have a headache".   "I get migraines but usually 2 extra strength Tylenol takes care of it but this time the Tylenol has not touched this headache.   I'm going to take some Excedrin to see if that will help.    I educated her on not getting too much Tylenol on board. She is also running a low grade fever with Tylenol on board.  I went over the isolation protocol with her per the latest CDC guidelines.   Her boyfriend is feeling bad too so I told her to have him quarantine also and do contact tracing for both of them.   She verbalized understanding.  She is interested in taking an oral antiviral medication.    Her only risk factor is she has a heart murmur and an atrial septal defect that is very small.   "The doctor said it was so small I didn't need to worry about it".  I let her know I would send a note to Dr. Parks Ranger at [facility] and have someone call her back regarding the oral antiviral.   She was agreeable to this plan.  She can be reached at the number in her chart.  I sent my notes to [facility] high priority.  Reason for Disposition . [1] SEVERE headache (e.g., excruciating) AND [2] not improved after 2 hours of pain medicine    Covid positive.   Waiting on PCR test to be sure but was exposed and having several symptoms of covid now.  Severe headache one of those symptoms.  Answer Assessment - Initial Assessment Questions 1. LOCATION: "Where does it hurt?"      I took a rapid covid test and it was  negative.   I'm waiting for my PCR test results.      Sat exposed to Covid.   I have a sore throat, headache, nausea.  I vomited this morning.    I had covid at the beginning of the year too.     2. ONSET: "When did the headache start?" (Minutes, hours or days)      Headache started at 6:00 PM yesterday 3. PATTERN: "Does the pain come and go, or has it been constant since it started?"     Constant   2 extra strength Tylenol hasn't touched it.  I feel better after I vomited.   I have low grade fever.    4. SEVERITY: "How bad is the pain?" and "What does it keep you from doing?"  (e.g., Scale 1-10; mild, moderate, or severe)   - MILD (1-3): doesn't interfere with normal activities    - MODERATE (4-7): interferes with normal activities or awakens from sleep    - SEVERE (8-10): excruciating pain, unable to do any normal activities        Moderate pain 5. RECURRENT SYMPTOM: "Have you ever had headaches before?" If Yes, ask: "When was the last  time?" and "What happened that time?"      I do get migraines.    6. CAUSE: "What do you think is causing the headache?"     Covid 7. MIGRAINE: "Have you been diagnosed with migraine headaches?" If Yes, ask: "Is this headache similar?"      Yes  8. HEAD INJURY: "Has there been any recent injury to the head?"      No 9. OTHER SYMPTOMS: "Do you have any other symptoms?" (fever, stiff neck, eye pain, sore throat, cold symptoms)     Yes Covid symptoms. 10. PREGNANCY: "Is there any chance you are pregnant?" "When was your last menstrual period?"       I'm on birth control  Protocols used: HEADACHE-A-AH

## 2021-05-16 ENCOUNTER — Other Ambulatory Visit: Payer: Self-pay

## 2021-05-16 ENCOUNTER — Telehealth: Payer: Self-pay | Admitting: Family Medicine

## 2021-05-16 ENCOUNTER — Ambulatory Visit (INDEPENDENT_AMBULATORY_CARE_PROVIDER_SITE_OTHER): Payer: Medicaid Other | Admitting: Internal Medicine

## 2021-05-16 ENCOUNTER — Encounter: Payer: Self-pay | Admitting: Internal Medicine

## 2021-05-16 ENCOUNTER — Telehealth: Payer: Medicaid Other | Admitting: Internal Medicine

## 2021-05-16 DIAGNOSIS — U071 COVID-19: Secondary | ICD-10-CM

## 2021-05-16 MED ORDER — NIRMATRELVIR/RITONAVIR (PAXLOVID)TABLET: ORAL_TABLET | ORAL | 0 refills | Status: DC

## 2021-05-16 NOTE — Progress Notes (Unsigned)
Virtual Visit via Video Note  I connected with April Fleming on 05/16/21 at 11:20 AM EDT by a video enabled telemedicine application and verified that I am speaking with the correct person using two identifiers.  Location: Patient: Home Provider: Office  Person's participating in this video call: April Silversmith, NP and [employer].   I discussed the limitations of evaluation and management by telemedicine and the availability of in person appointments. The patient expressed understanding and agreed to proceed.  History of Present Illness:  Pt reports.   Past Medical History:  Diagnosis Date  . Abdominal pain   . COVID-19 12/23/2020  . Vomiting     Current Outpatient Medications  Medication Sig Dispense Refill  . acetaminophen (TYLENOL) 500 MG tablet Take 500 mg by mouth every 6 (six) hours as needed.    Marland Kitchen albuterol (VENTOLIN HFA) 108 (90 Base) MCG/ACT inhaler Inhale 2 puffs into the lungs every 6 (six) hours as needed for wheezing or shortness of breath. (Patient not taking: Reported on 05/10/2021) 8 g 0  . aspirin-acetaminophen-caffeine (EXCEDRIN MIGRAINE) 250-250-65 MG tablet Take by mouth every 6 (six) hours as needed for headache.    . baclofen (LIORESAL) 10 MG tablet Take 0.5-1 tablets (5-10 mg total) by mouth 3 (three) times daily as needed for muscle spasms. 30 each 2  . CRYSELLE-28 0.3-30 MG-MCG tablet TAKE 1 TABLET BY MOUTH ONCE DAILY 84 tablet 0  . diclofenac (VOLTAREN) 50 MG EC tablet Take 1 tablet (50 mg total) by mouth 2 (two) times daily as needed. 60 tablet 0  . dicyclomine (BENTYL) 10 MG capsule Take 10 mg by mouth in the morning, at noon, in the evening, and at bedtime.    . dicyclomine (BENTYL) 10 MG capsule Take 10 mg by mouth 3 (three) times daily as needed for spasms. (Patient not taking: Reported on 05/10/2021)    . DULoxetine (CYMBALTA) 20 MG capsule Take 1 capsule (20 mg total) by mouth daily. (Patient not taking: Reported on 05/10/2021) 30 capsule 2  .  ibuprofen (ADVIL) 200 MG tablet Take 200 mg by mouth every 6 (six) hours as needed.    Marland Kitchen ketorolac (TORADOL) 10 MG tablet Take 1 tablet (10 mg total) by mouth every 6 (six) hours as needed. (Patient not taking: Reported on 05/10/2021) 20 tablet 0  . Multiple Vitamin (MULTIVITAMIN) tablet Take 1 tablet by mouth daily.    . Omega-3 Fatty Acids (FISH OIL ADULT GUMMIES PO) Take by mouth daily.    Marland Kitchen Peppermint Oil 90 MG CPCR Take 1 capsule by mouth 3 (three) times daily as needed.     No current facility-administered medications for this visit.    No Known Allergies  Family History  Problem Relation Age of Onset  . COPD Mother   . Melanoma Father 26  . Melanoma Maternal Grandmother   . Healthy Maternal Grandfather   . Intellectual disability Maternal Grandfather   . Dementia Paternal Grandmother   . Healthy Other   . Healthy Other     Social History   Socioeconomic History  . Marital status: Single    Spouse name: Not on file  . Number of children: Not on file  . Years of education: Not on file  . Highest education level: High school graduate  Occupational History  . Not on file  Tobacco Use  . Smoking status: Never Smoker  . Smokeless tobacco: Never Used  Vaping Use  . Vaping Use: Former  . Quit date: 05/12/2019  .  Substances: Nicotine, Flavoring  . Devices: self-filled vaporizer  Substance and Sexual Activity  . Alcohol use: Yes    Comment: occasionally  . Drug use: Never  . Sexual activity: Yes    Birth control/protection: Pill  Other Topics Concern  . Not on file  Social History Narrative  . Not on file   Social Determinants of Health   Financial Resource Strain: Not on file  Food Insecurity: Not on file  Transportation Needs: Not on file  Physical Activity: Not on file  Stress: Not on file  Social Connections: Not on file  Intimate Partner Violence: Not on file     Constitutional: Denies fever, malaise, fatigue, headache or abrupt weight changes.  HEENT:  Denies eye pain, eye redness, ear pain, ringing in the ears, wax buildup, runny nose, nasal congestion, bloody nose, or sore throat. Respiratory: Denies difficulty breathing, shortness of breath, cough or sputum production.   Cardiovascular: Denies chest pain, chest tightness, palpitations or swelling in the hands or feet.  Gastrointestinal: Denies abdominal pain, bloating, constipation, diarrhea or blood in the stool.  GU: Denies urgency, frequency, pain with urination, burning sensation, blood in urine, odor or discharge. Musculoskeletal: Denies decrease in range of motion, difficulty with gait, muscle pain or joint pain and swelling.  Skin: Denies redness, rashes, lesions or ulcercations.  Neurological: Denies dizziness, difficulty with memory, difficulty with speech or problems with balance and coordination.  Psych: Denies anxiety, depression, SI/HI.  No other specific complaints in a complete review of systems (except as listed in HPI above).    Observations/Objective:  LMP 04/30/2021  Wt Readings from Last 3 Encounters:  05/10/21 126 lb (57.2 kg)  04/23/21 127 lb 9.6 oz (57.9 kg)  04/21/21 125 lb (56.7 kg)    General: Appears her stated age, well developed, well nourished in NAD. HEENT: Head: normal shape and size;  Nose: ; Throat/Mouth:  Pulmonary/Chest: Normal effort. No respiratory distress.  Neurological: Alert and oriented.   BMET    Component Value Date/Time   NA 137 02/07/2021 1319   K 3.3 (L) 02/07/2021 1319   CL 105 02/07/2021 1319   CO2 21 (L) 02/07/2021 1319   GLUCOSE 83 02/07/2021 1319   BUN 9 02/07/2021 1319   CREATININE 0.54 02/07/2021 1319   CREATININE 0.64 05/26/2019 0925   CALCIUM 9.5 02/07/2021 1319   GFRNONAA >60 02/07/2021 1319   GFRNONAA 129 05/26/2019 0925   GFRAA >60 07/03/2020 1119   GFRAA 150 05/26/2019 0925    Lipid Panel  No results found for: CHOL, TRIG, HDL, CHOLHDL, VLDL, LDLCALC  CBC    Component Value Date/Time   WBC 7.3  02/07/2021 1319   RBC 4.53 02/07/2021 1319   HGB 14.0 02/07/2021 1319   HCT 39.8 02/07/2021 1319   PLT 259 02/07/2021 1319   MCV 87.9 02/07/2021 1319   MCH 30.9 02/07/2021 1319   MCHC 35.2 02/07/2021 1319   RDW 13.2 02/07/2021 1319   LYMPHSABS 2,006 05/26/2019 0925   EOSABS 101 05/26/2019 0925   BASOSABS 48 05/26/2019 0925    Hgb A1C No results found for: HGBA1C      Assessment and Plan:   Follow Up Instructions:    I discussed the assessment and treatment plan with the patient. The patient was provided an opportunity to ask questions and all were answered. The patient agreed with the plan and demonstrated an understanding of the instructions.   The patient was advised to call back or seek an in-person evaluation if the  symptoms worsen or if the condition fails to improve as anticipated.     April Silversmith, NP

## 2021-05-16 NOTE — Telephone Encounter (Signed)
The pt decided to hold off on taking the medication because she is feeling better.

## 2021-05-16 NOTE — Progress Notes (Signed)
Virtual Visit via Video Note  I connected with April Fleming on 05/16/21 at  8:20 AM EDT by a video enabled telemedicine application and verified that I am speaking with the correct person using two identifiers.  Location: Patient: Home Provider: Office  Person's participating in this video call: [name], NP-C and [employer].   I discussed the limitations of evaluation and management by telemedicine and the availability of in person appointments. The patient expressed understanding and agreed to proceed.  History of Present Illness:  Pt reports headache, sore throat, nausea and vomiting. She reports this started 3 days ago. The headache was located in her temples and forehead. She describes the pain as achy, sharp, throbbing and pulsating. She reports associated dizziness, sensitivity to light, sensitivity to sound, nausea and vomiting. She reports some post nasal drip but denies difficulty swallowing. She denies vision changes, runny nose, nasal congestion, ear pain, cough or SOB. She denies fever, chills or body aches. She has taken Tylenol and Excedrin Migraine with good relief of symptoms. She does have a history of migraines and reports this felt similar just worse. She had a negative rapid Covid test at home but a positive PCR. She has not taken any additional medications OTC for this.   Past Medical History:  Diagnosis Date  . Abdominal pain   . COVID-19 12/23/2020  . Vomiting     Current Outpatient Medications  Medication Sig Dispense Refill  . acetaminophen (TYLENOL) 500 MG tablet Take 500 mg by mouth every 6 (six) hours as needed.    Marland Kitchen albuterol (VENTOLIN HFA) 108 (90 Base) MCG/ACT inhaler Inhale 2 puffs into the lungs every 6 (six) hours as needed for wheezing or shortness of breath. (Patient not taking: Reported on 05/10/2021) 8 g 0  . aspirin-acetaminophen-caffeine (EXCEDRIN MIGRAINE) 250-250-65 MG tablet Take by mouth every 6 (six) hours as needed for headache.     . baclofen (LIORESAL) 10 MG tablet Take 0.5-1 tablets (5-10 mg total) by mouth 3 (three) times daily as needed for muscle spasms. 30 each 2  . CRYSELLE-28 0.3-30 MG-MCG tablet TAKE 1 TABLET BY MOUTH ONCE DAILY 84 tablet 0  . diclofenac (VOLTAREN) 50 MG EC tablet Take 1 tablet (50 mg total) by mouth 2 (two) times daily as needed. 60 tablet 0  . dicyclomine (BENTYL) 10 MG capsule Take 10 mg by mouth in the morning, at noon, in the evening, and at bedtime.    . dicyclomine (BENTYL) 10 MG capsule Take 10 mg by mouth 3 (three) times daily as needed for spasms. (Patient not taking: Reported on 05/10/2021)    . DULoxetine (CYMBALTA) 20 MG capsule Take 1 capsule (20 mg total) by mouth daily. (Patient not taking: Reported on 05/10/2021) 30 capsule 2  . ibuprofen (ADVIL) 200 MG tablet Take 200 mg by mouth every 6 (six) hours as needed.    Marland Kitchen ketorolac (TORADOL) 10 MG tablet Take 1 tablet (10 mg total) by mouth every 6 (six) hours as needed. (Patient not taking: Reported on 05/10/2021) 20 tablet 0  . Multiple Vitamin (MULTIVITAMIN) tablet Take 1 tablet by mouth daily.    . Omega-3 Fatty Acids (FISH OIL ADULT GUMMIES PO) Take by mouth daily.    Marland Kitchen Peppermint Oil 90 MG CPCR Take 1 capsule by mouth 3 (three) times daily as needed.     No current facility-administered medications for this visit.    No Known Allergies  Family History  Problem Relation Age of Onset  . COPD  Mother   . Melanoma Father 71  . Melanoma Maternal Grandmother   . Healthy Maternal Grandfather   . Intellectual disability Maternal Grandfather   . Dementia Paternal Grandmother   . Healthy Other   . Healthy Other     Social History   Socioeconomic History  . Marital status: Single    Spouse name: Not on file  . Number of children: Not on file  . Years of education: Not on file  . Highest education level: High school graduate  Occupational History  . Not on file  Tobacco Use  . Smoking status: Never Smoker  . Smokeless  tobacco: Never Used  Vaping Use  . Vaping Use: Former  . Quit date: 05/12/2019  . Substances: Nicotine, Flavoring  . Devices: self-filled vaporizer  Substance and Sexual Activity  . Alcohol use: Yes    Comment: occasionally  . Drug use: Never  . Sexual activity: Yes    Birth control/protection: Pill  Other Topics Concern  . Not on file  Social History Narrative  . Not on file   Social Determinants of Health   Financial Resource Strain: Not on file  Food Insecurity: Not on file  Transportation Needs: Not on file  Physical Activity: Not on file  Stress: Not on file  Social Connections: Not on file  Intimate Partner Violence: Not on file     Constitutional: Pt reports headache. Denies fever, malaise, fatigue, or abrupt weight changes.  HEENT: Pt reports sore throat. Denies eye pain, eye redness, ear pain, ringing in the ears, wax buildup, runny nose, nasal congestion, bloody nose. Respiratory: Denies difficulty breathing, shortness of breath, cough or sputum production.   Cardiovascular: Denies chest pain, chest tightness, palpitations or swelling in the hands or feet.  Gastrointestinal: Pt reports nausea and vomiting. Denies abdominal pain, bloating, constipation, diarrhea or blood in the stool.    No other specific complaints in a complete review of systems (except as listed in HPI above).    Observations/Objective: LMP 04/30/2021  Wt Readings from Last 3 Encounters:  05/10/21 126 lb (57.2 kg)  04/23/21 127 lb 9.6 oz (57.9 kg)  04/21/21 125 lb (56.7 kg)    General:  In NAD. HEENT: Nose: no congestion noted ; Throat/Mouth: no hoarseness noted Pulmonary/Chest: Normal effort. No respiratory distress.  Neurological: Alert and oriented.   BMET    Component Value Date/Time   NA 137 02/07/2021 1319   K 3.3 (L) 02/07/2021 1319   CL 105 02/07/2021 1319   CO2 21 (L) 02/07/2021 1319   GLUCOSE 83 02/07/2021 1319   BUN 9 02/07/2021 1319   CREATININE 0.54 02/07/2021 1319    CREATININE 0.64 05/26/2019 0925   CALCIUM 9.5 02/07/2021 1319   GFRNONAA >60 02/07/2021 1319   GFRNONAA 129 05/26/2019 0925   GFRAA >60 07/03/2020 1119   GFRAA 150 05/26/2019 0925    Lipid Panel  No results found for: CHOL, TRIG, HDL, CHOLHDL, VLDL, LDLCALC  CBC    Component Value Date/Time   WBC 7.3 02/07/2021 1319   RBC 4.53 02/07/2021 1319   HGB 14.0 02/07/2021 1319   HCT 39.8 02/07/2021 1319   PLT 259 02/07/2021 1319   MCV 87.9 02/07/2021 1319   MCH 30.9 02/07/2021 1319   MCHC 35.2 02/07/2021 1319   RDW 13.2 02/07/2021 1319   LYMPHSABS 2,006 05/26/2019 0925   EOSABS 101 05/26/2019 0925   BASOSABS 48 05/26/2019 0925    Hgb A1C No results found for: HGBA1C  Assessment and Plan:  Covid 19:  Encouraged rest and fluids Discussed symptomatic care: Ibuprofen and Zyrtec for sore throat She can continue Tylenol/Ibuprofen and Excedrin as needed for headache RX for Paxlovid sent to pharmacy, kidney function reviewed She declines RX for antiemetic at this time Encouraged masking, social distancing, frequent handwashing and self quarantine x 5 days Work note provided  Return precautions discussed Follow Up Instructions:    I discussed the assessment and treatment plan with the patient. The patient was provided an opportunity to ask questions and all were answered. The patient agreed with the plan and demonstrated an understanding of the instructions.   The patient was advised to call back or seek an in-person evaluation if the symptoms worsen or if the condition fails to improve as anticipated.     [name], NP

## 2021-05-16 NOTE — Telephone Encounter (Signed)
This is the only oral antiviral I am comfortable prescribing. I recommend she try [facility] or [facility]. She is also feeling better so an alternative option would just not to take the antiviral at all.

## 2021-05-16 NOTE — Telephone Encounter (Signed)
Patient called in stating that the pharmacy does not carry the medication she was prescribed nirmatrelvir/ritonavir EUA (PAXLOVID) TABS, patient is requesting to have another medication prescribed for her.   Pharmacy:  [place], [place]. Phone:  [phone]  Fax:  [phone]

## 2021-05-16 NOTE — Patient Instructions (Signed)

## 2021-05-31 DIAGNOSIS — R590 Localized enlarged lymph nodes: Secondary | ICD-10-CM | POA: Diagnosis not present

## 2021-06-21 ENCOUNTER — Encounter: Payer: Medicaid Other | Admitting: Family Medicine

## 2021-07-12 DIAGNOSIS — R590 Localized enlarged lymph nodes: Secondary | ICD-10-CM | POA: Diagnosis not present

## 2021-07-12 DIAGNOSIS — M940 Chondrocostal junction syndrome [Tietze]: Secondary | ICD-10-CM | POA: Diagnosis not present

## 2021-07-12 DIAGNOSIS — M62838 Other muscle spasm: Secondary | ICD-10-CM | POA: Diagnosis not present

## 2021-07-23 ENCOUNTER — Telehealth: Payer: Self-pay | Admitting: Certified Nurse Midwife

## 2021-07-23 NOTE — Telephone Encounter (Signed)
Informed patient of annies advice, patient verbalized no further questions or concerns.

## 2021-07-23 NOTE — Telephone Encounter (Signed)
Pt called asking to speak to a nurse to discuss some issues that have recently started with her birth control. This is a April Fleming pt- she requested to stay with April Fleming; I offered to make apt- she stated she rather speak over the phone if possible. Please Advise.

## 2021-09-14 DIAGNOSIS — R350 Frequency of micturition: Secondary | ICD-10-CM | POA: Diagnosis not present

## 2021-09-14 DIAGNOSIS — K582 Mixed irritable bowel syndrome: Secondary | ICD-10-CM | POA: Diagnosis not present

## 2021-09-14 DIAGNOSIS — Z3202 Encounter for pregnancy test, result negative: Secondary | ICD-10-CM | POA: Diagnosis not present

## 2021-09-20 ENCOUNTER — Other Ambulatory Visit: Payer: Self-pay

## 2021-09-20 ENCOUNTER — Encounter: Payer: Self-pay | Admitting: Internal Medicine

## 2021-09-20 ENCOUNTER — Ambulatory Visit: Payer: Medicaid Other | Admitting: Internal Medicine

## 2021-09-20 VITALS — BP 103/59 | HR 72 | Temp 97.7°F | Resp 17 | Ht 64.0 in | Wt 126.6 lb

## 2021-09-20 DIAGNOSIS — K580 Irritable bowel syndrome with diarrhea: Secondary | ICD-10-CM

## 2021-09-20 NOTE — Progress Notes (Signed)
Subjective:    Patient ID: April Fleming, female    DOB: 2001-03-18, 20 y.o.   MRN: [id number]  HPI  Pt presents to the clinic today for urgent care follow up. She went to [facility] 9/23 with c/o abdominal cramps, nausea, diarrhea and urinary frequency. She was concerned that she may be pregnant because she had been off her birth control x 2 months. Urine pregnancy test was negative. She was prescribed Bentyl and Zofran and reports significantly have improved. She feels like her IBS is related to soda and stress.  Review of Systems     Past Medical History:  Diagnosis Date   Abdominal pain    COVID-19 12/23/2020   Vomiting     Current Outpatient Medications  Medication Sig Dispense Refill   acetaminophen (TYLENOL) 500 MG tablet Take 500 mg by mouth every 6 (six) hours as needed.     aspirin-acetaminophen-caffeine (EXCEDRIN MIGRAINE) 250-250-65 MG tablet Take by mouth every 6 (six) hours as needed for headache.     dicyclomine (BENTYL) 10 MG capsule Take 10 mg by mouth 3 (three) times daily as needed for spasms.     ibuprofen (ADVIL) 200 MG tablet Take 200 mg by mouth every 6 (six) hours as needed.     methocarbamol (ROBAXIN) 500 MG tablet Take 1,000 mg by mouth 2 (two) times daily.     Multiple Vitamin (MULTIVITAMIN) tablet Take 1 tablet by mouth daily.     Omega-3 Fatty Acids (FISH OIL ADULT GUMMIES PO) Take by mouth daily.     Peppermint Oil 90 MG CPCR Take 1 capsule by mouth 3 (three) times daily as needed.     baclofen (LIORESAL) 10 MG tablet Take 0.5-1 tablets (5-10 mg total) by mouth 3 (three) times daily as needed for muscle spasms. (Patient not taking: Reported on 09/20/2021) 30 each 2   CRYSELLE-28 0.3-30 MG-MCG tablet TAKE 1 TABLET BY MOUTH ONCE DAILY (Patient not taking: Reported on 09/20/2021) 84 tablet 0   DULoxetine (CYMBALTA) 20 MG capsule Take 1 capsule (20 mg total) by mouth daily. (Patient not taking: No sig reported) 30 capsule 2   ondansetron (ZOFRAN-ODT) 4 MG  disintegrating tablet Take by mouth. (Patient not taking: Reported on 09/20/2021)     No current facility-administered medications for this visit.    No Known Allergies  Family History  Problem Relation Age of Onset   COPD Mother    Melanoma Father 39   Melanoma Maternal Grandmother    Healthy Maternal Grandfather    Intellectual disability Maternal Grandfather    Dementia Paternal Grandmother    Healthy Other    Healthy Other     Social History   Socioeconomic History   Marital status: Single    Spouse name: Not on file   Number of children: Not on file   Years of education: Not on file   Highest education level: High school graduate  Occupational History   Not on file  Tobacco Use   Smoking status: Never   Smokeless tobacco: Never  Vaping Use   Vaping Use: Former   Quit date: 05/12/2019   Substances: Nicotine, Flavoring   Devices: self-filled vaporizer  Substance and Sexual Activity   Alcohol use: Yes    Comment: occasionally   Drug use: Never   Sexual activity: Yes    Birth control/protection: Pill  Other Topics Concern   Not on file  Social History Narrative   Not on file   Social Determinants of Health  Financial Resource Strain: Not on file  Food Insecurity: Not on file  Transportation Needs: Not on file  Physical Activity: Not on file  Stress: Not on file  Social Connections: Not on file  Intimate Partner Violence: Not on file     Constitutional: Denies fever, malaise, fatigue, headache or abrupt weight changes.  Respiratory: Denies difficulty breathing, shortness of breath, cough or sputum production.   Cardiovascular: Denies chest pain, chest tightness, palpitations or swelling in the hands or feet.  Gastrointestinal: Pt reports intermittent abdominal cramping, nausea and diarrhea. Denies bloating, constipation, or blood in the stool.  GU: Pt reports irregular menses. Denies urgency, frequency, pain with urination, burning sensation, blood in  urine, odor or discharge. Psych: Pt reports stress and anxiety. Denies depression, SI/HI.  No other specific complaints in a complete review of systems (except as listed in HPI above).  Objective:   Physical Exam  BP (!) 103/59 (BP Location: Right Arm, Patient Position: Sitting, Cuff Size: Normal)   Pulse 72   Temp 97.7 F (36.5 C) (Temporal)   Resp 17   Ht 5\' 4"  (1.626 m)   Wt 126 lb 9.6 oz (57.4 kg)   LMP 06/21/2021   SpO2 100%   BMI 21.73 kg/m   Wt Readings from Last 3 Encounters:  05/10/21 126 lb (57.2 kg)  04/23/21 127 lb 9.6 oz (57.9 kg)  04/21/21 125 lb (56.7 kg)    General: Appears her stated age, well developed, well nourished in NAD. Skin: Warm, dry and intact.  Cardiovascular: Normal rate and rhythm. S1,S2 noted.  No murmur, rubs or gallops noted.  Pulmonary/Chest: Normal effort and positive vesicular breath sounds. No respiratory distress. No wheezes, rales or ronchi noted.  Abdomen: Soft and nontender. Normal bowel sounds. No distention or masses noted. Neurological: Alert and oriented.  Psychiatric: Mood and affect normal. Mildly anxious appearing. Judgment and thought content normal.    BMET    Component Value Date/Time   NA 137 02/07/2021 1319   K 3.3 (L) 02/07/2021 1319   CL 105 02/07/2021 1319   CO2 21 (L) 02/07/2021 1319   GLUCOSE 83 02/07/2021 1319   BUN 9 02/07/2021 1319   CREATININE 0.54 02/07/2021 1319   CREATININE 0.64 05/26/2019 0925   CALCIUM 9.5 02/07/2021 1319   GFRNONAA >60 02/07/2021 1319   GFRNONAA 129 05/26/2019 0925   GFRAA >60 07/03/2020 1119   GFRAA 150 05/26/2019 0925    Lipid Panel  No results found for: CHOL, TRIG, HDL, CHOLHDL, VLDL, LDLCALC  CBC    Component Value Date/Time   WBC 7.3 02/07/2021 1319   RBC 4.53 02/07/2021 1319   HGB 14.0 02/07/2021 1319   HCT 39.8 02/07/2021 1319   PLT 259 02/07/2021 1319   MCV 87.9 02/07/2021 1319   MCH 30.9 02/07/2021 1319   MCHC 35.2 02/07/2021 1319   RDW 13.2 02/07/2021 1319    LYMPHSABS 2,006 05/26/2019 0925   EOSABS 101 05/26/2019 0925   BASOSABS 48 05/26/2019 0925    Hgb A1C No results found for: HGBA1C         Assessment & Plan:   [facility] Follow Up for IBS Flare:  [facility] notes and labs reviewed Discussed role of managing anxiety with Hx of IBS She is prescribed Duloxetine but does not take this, she is not interested in medication therapy at this time as she is trying to find other ways to manage her stress. Continue Bentyl and Zofran as needed  Return precautions discussed [name], NP  This visit occurred during the SARS-CoV-2 public health emergency.  Safety protocols were in place, including screening questions prior to the visit, additional usage of staff PPE, and extensive cleaning of exam room while observing appropriate contact time as indicated for disinfecting solutions.

## 2021-09-20 NOTE — Patient Instructions (Signed)
Alimentao na sndrome do intestino irritvel Diet for Irritable Bowel Syndrome Quando voc tem sndrome do intestino irritvel (SII),  muito importante comer os alimentos e seguir os hbitos alimentares que so os mais indicados para a sua condio. A SII pode causar vrios sintomas, como dor no abdome, priso de ventre ou diarreia. Escolher os alimentos certos pode ajudar a Furniture conservator/restorer o desconforto desses sintomas. Converse com seu mdico e especialista em dieta e nutrio (nutricionista) para elaborar o plano de alimentao que ajudar a Chief Technology Officer seus sintomas. Quais so as dicas para seguir esse plano? Faa um dirio alimentar. Isso ajudar a identificar os alimentos que causam sintomas. Anote: O que voc come e quando come. Os sintomas que voc sentiu. Quando os sintomas ocorrem em relao s suas refeies, como "dor no abdome 2 horas aps o jantar". Alimente-se com calma e em um ambiente tranquilo. Tente fazer de 5-6 pequenas refeies por dia. No pule refeies. Beba lquidos em quantidade suficiente para manter a urina na cor amarelo-plida. Pergunte ao seu mdico se voc deve tomar um probitico de venda livre para ajudar a restabelecer as bactrias saudveis em seu intestino (trato digestivo). Probiticos so alimentos que contm bactrias e leveduras boas. Seu nutricionista pode ter recomendaes dietticas especficas para voc com base nos seus sintomas. Ele pode recomendar que voc: Evite os alimentos que causam os sintomas. Converse com seu nutricionista sobre outras maneiras de obter os mesmos nutrientes que esto presentes nos alimentos que causam problemas. Evite alimentos com glten. O glten  uma protena encontrada no trigo, centeio e cevada. Coma mais alimentos que contenham fibras solveis. Exemplos de alimentos com fibras altamente solveis incluem aveia, sementes e certas frutas e legumes. Tome um suplemento de [place], se orientado por seu nutricionista. Reduza ou evite  certos alimentos chamados FODMAPs. So alimentos que contm carboidratos difceis de digerir. Pergunte ao seu mdico quais alimentos contm esses tipos de carboidratos.      Quais alimentos no so recomendados? Os seguintes alimentos e bebidas podem piorar os sintomas: Alimentos gordurosos, como batata frita. Alimentos que contm glten, como massas e cereais. Laticnios, como leite, queijo e sorvete. Chocolate. lcool. Produtos com cafena, como caf. Bebidas gaseificadas, como refrigerante. Alimentos com elevado teor de FODMAPs. Isso inclui determinadas frutas e legumes. Produtos com adoantes como mel, xarope de milho de alta frutose, sorbitol e manitol. Os itens listados acima podem no ser [place] alimentos e bebidas que voc deve evitar. Converse com um nutricionista para obter mais informaes.   Quais alimentos so boas fontes de [place]? Seu mdico ou nutricionista pode recomendar que voc coma mais alimentos que [employer]. A fibra pode ajudar a reduzir a constipao e outros sintomas da SII. Adicione alimentos com fibras  sua dieta aos poucos para que seu corpo possa se acostumar com eles. Excesso de fibras de uma s vez pode causar gases e inchao do abdome. A seguir esto alguns alimentos que so boas fontes de fibra: Frutas vermelhas, como framboesa, morango e mirtilo. Tomates. [place]ra. Arroz integral. Elna Breslow. Sementes, como chia e semente de abbora. Os itens listados acima podem no ser [facility] fontes de fibra recomendadas. Entre em contato com o nutricionista para obter mais opes. Onde conseguir [employer] for Functional Gastrointestinal Disorders (Fundao Internacional de Distrbios Gastrointestinais Funcionais): www.iffgd.[employer] of Diabetes and Digestive and Kidney Diseases ([place] Diabetes e Doenas Digestivas e Renais): [employer].dk Resumo Quando voc tem sndrome  do intestino irritvel (SII),  muito importante comer  os alimentos e seguir os hbitos alimentares que so os mais indicados para a sua condio. A SII pode causar vrios sintomas, como dor no abdome, priso de ventre ou diarreia. Escolher os alimentos certos pode ajudar a Furniture conservator/restorer o desconforto que surge a partir desses sintomas. Faa um dirio alimentar. Isso ajudar a identificar os alimentos que causam sintomas. Seu mdico ou especialista em dieta e nutrio (nutricionista) pode recomendar que voc coma mais alimentos que [place]. Estas informaes no se destinam a substituir as recomendaes de seu mdico. No deixe de discutir quaisquer dvidas com seu mdico. Document Revised: 09/29/2020 Document Reviewed: 09/29/2020 Elsevier Patient Education  2021 [employer].

## 2021-11-08 ENCOUNTER — Encounter: Payer: Medicaid Other | Admitting: Student in an Organized Health Care Education/Training Program

## 2021-11-08 ENCOUNTER — Other Ambulatory Visit: Payer: Self-pay

## 2021-11-08 ENCOUNTER — Ambulatory Visit (INDEPENDENT_AMBULATORY_CARE_PROVIDER_SITE_OTHER): Payer: Medicaid Other | Admitting: Dermatology

## 2021-11-08 DIAGNOSIS — L905 Scar conditions and fibrosis of skin: Secondary | ICD-10-CM | POA: Diagnosis not present

## 2021-11-08 DIAGNOSIS — D2261 Melanocytic nevi of right upper limb, including shoulder: Secondary | ICD-10-CM

## 2021-11-08 DIAGNOSIS — D485 Neoplasm of uncertain behavior of skin: Secondary | ICD-10-CM

## 2021-11-08 DIAGNOSIS — D235 Other benign neoplasm of skin of trunk: Secondary | ICD-10-CM

## 2021-11-08 DIAGNOSIS — Z1283 Encounter for screening for malignant neoplasm of skin: Secondary | ICD-10-CM

## 2021-11-08 DIAGNOSIS — D239 Other benign neoplasm of skin, unspecified: Secondary | ICD-10-CM

## 2021-11-08 DIAGNOSIS — D1801 Hemangioma of skin and subcutaneous tissue: Secondary | ICD-10-CM

## 2021-11-08 DIAGNOSIS — L814 Other melanin hyperpigmentation: Secondary | ICD-10-CM

## 2021-11-08 DIAGNOSIS — L578 Other skin changes due to chronic exposure to nonionizing radiation: Secondary | ICD-10-CM

## 2021-11-08 DIAGNOSIS — L813 Cafe au lait spots: Secondary | ICD-10-CM

## 2021-11-08 DIAGNOSIS — Z808 Family history of malignant neoplasm of other organs or systems: Secondary | ICD-10-CM | POA: Diagnosis not present

## 2021-11-08 HISTORY — DX: Other benign neoplasm of skin, unspecified: D23.9

## 2021-11-08 NOTE — Progress Notes (Signed)
New Patient Visit  Subjective  April Fleming is a 20 y.o. female who presents for the following: FBSE (Patient here for full body skin exam and skin cancer screening. Patient with fhx skin cancer, patient's father passed away from melanoma and patient's maternal grandmother with hx of skin cancer. Patient does have a few spots she would like checked. ).   The following portions of the chart were reviewed this encounter and updated as appropriate:   Tobacco  Allergies  Meds  Problems  Med Hx  Surg Hx  Fam Hx      Review of Systems:  No other skin or systemic complaints except as noted in HPI or Assessment and Plan.  Objective  Well appearing patient in no apparent distress; mood and affect are within normal limits.  A full examination was performed including scalp, head, eyes, ears, nose, lips, neck, chest, axillae, abdomen, back, buttocks, bilateral upper extremities, bilateral lower extremities, hands, feet, fingers, toes, fingernails, and toenails. All findings within normal limits unless otherwise noted below.  Right Shoulder 0.4 cm light brown and dark brown macule, darker at left half     Left Superior Breast Firm pink/brown papulenodule with dimple sign.   right upper back     Assessment & Plan  Neoplasm of uncertain behavior of skin Right Shoulder  Epidermal / dermal shaving  Lesion diameter (cm):  0.4 Informed consent: discussed and consent obtained   Timeout: patient name, date of birth, surgical site, and procedure verified   Patient was prepped and draped in usual sterile fashion: area prepped with isopropyl alcohol. Anesthesia: the lesion was anesthetized in a standard fashion   Anesthetic:  1% lidocaine w/ epinephrine 1-100,000 buffered w/ 8.4% NaHCO3 Instrument used: flexible razor blade   Hemostasis achieved with: aluminum chloride   Outcome: patient tolerated procedure well   Post-procedure details: wound care instructions given    Additional details:  Mupirocin and a bandage applied  Specimen 1 - Surgical pathology Differential Diagnosis: r/o Atypia  Check Margins: No 0.4 cm light brown and dark brown macule, darker at left half  Dermatofibroma Left Superior Breast  Benign-appearing.  Observation.  Call clinic for new or changing lesions.    Scar right upper back  Benign-appearing.  Observation.  Call clinic for new or changing lesions.    Hemangiomas - Red papules - Discussed benign nature - Observe - Call for any changes  Lentigines - Scattered tan macules - Due to sun exposure - Benign-appering, observe - Recommend daily broad spectrum sunscreen SPF 30+ to sun-exposed areas, reapply every 2 hours as needed. - Call for any changes  Actinic Damage - chronic, secondary to cumulative UV radiation exposure/sun exposure over time - diffuse scaly erythematous macules with underlying dyspigmentation - Recommend daily broad spectrum sunscreen SPF 30+ to sun-exposed areas, reapply every 2 hours as needed.  - Recommend staying in the shade or wearing long sleeves, sun glasses (UVA+UVB protection) and wide brim hats (4-inch brim around the entire circumference of the hat). - Call for new or changing lesions.  Melanocytic Nevi - Tan-brown and/or pink-flesh-colored symmetric macules and papules - Benign appearing on exam today - Observation - Call clinic for new or changing moles - Recommend daily use of broad spectrum spf 30+ sunscreen to sun-exposed areas.   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  Graciella Belton, RMA, am acting as  scribe for Forest Gleason, MD .  Documentation: I have reviewed the above documentation for accuracy and completeness, and I agree with the above.  Forest Gleason, MD

## 2021-11-08 NOTE — Patient Instructions (Addendum)
Recommend Serica moisturizing scar formula cream every night or [facility] brand or Mederma silicone scar sheet every night for the first year after a scar appears to help with scar remodeling if desired. Scars remodel on their own for a full year.  Recommend taking Heliocare sun protection supplement daily in sunny weather for additional sun protection. For maximum protection on the sunniest days, you can take up to 2 capsules of regular Heliocare OR take 1 capsule of Heliocare Ultra. For prolonged exposure (such as a full day in the sun), you can repeat your dose of the supplement 4 hours after your first dose. Heliocare can be purchased at [facility] or at [email or web address].     Wound Care Instructions  Cleanse wound gently with soap and water once a day then pat dry with clean gauze. Apply a thing coat of Petrolatum (petroleum jelly, "Vaseline") over the wound (unless you have an allergy to this). We recommend that you use a new, sterile tube of Vaseline. Do not pick or remove scabs. Do not remove the yellow or white "healing tissue" from the base of the wound.  Cover the wound with fresh, clean, nonstick gauze and secure with paper tape. You may use Band-Aids in place of gauze and tape if the would is small enough, but would recommend trimming much of the tape off as there is often too much. Sometimes Band-Aids can irritate the skin.  You should call the office for your biopsy report after 1 week if you have not already been contacted.  If you experience any problems, such as abnormal amounts of bleeding, swelling, significant bruising, significant pain, or evidence of infection, please call the office immediately.    Melanoma ABCDEs  Melanoma is the most dangerous type of skin cancer, and is the leading cause of death from skin disease.  You are more likely to develop melanoma if you: Have light-colored skin, light-colored eyes, or red or blond hair Spend a lot of time in the  sun Tan regularly, either outdoors or in a tanning bed Have had blistering sunburns, especially during childhood Have a close family member who has had a melanoma Have atypical moles or large birthmarks  Early detection of melanoma is key since treatment is typically straightforward and cure rates are extremely high if we catch it early.   The first sign of melanoma is often a change in a mole or a new dark spot.  The ABCDE system is a way of remembering the signs of melanoma.  A for asymmetry:  The two halves do not match. B for border:  The edges of the growth are irregular. C for color:  A mixture of colors are present instead of an even brown color. D for diameter:  Melanomas are usually (but not always) greater than 64mm - the size of a pencil eraser. E for evolution:  The spot keeps changing in size, shape, and color.  Please check your skin once per month between visits. You can use a small mirror in front and a large mirror behind you to keep an eye on the back side or your body.   If you see any new or changing lesions before your next follow-up, please call to schedule a visit.  Please continue daily skin protection including broad spectrum sunscreen SPF 30+ to sun-exposed areas, reapplying every 2 hours as needed when you're outdoors.    If You Need Anything After Your Visit  If you have any questions or concerns for your  doctor, please call our main line at [phone] and press option 4 to reach your doctor's medical assistant. If no one answers, please leave a voicemail as directed and we will return your call as soon as possible. Messages left after 4 pm will be answered the following business day.   You may also send [place] a message via [place]. We typically respond to MyChart messages within 1-2 business days.  For prescription refills, please ask your pharmacy to contact our office. Our fax number is [phone].  If you have an urgent issue when the clinic is closed  that cannot wait until the next business day, you can page your doctor at the number below.    Please note that while we do our best to be available for urgent issues outside of office hours, we are not available 24/7.   If you have an urgent issue and are unable to reach [place], you may choose to seek medical care at your doctor's office, retail clinic, urgent care center, or emergency room.  If you have a medical emergency, please immediately call 911 or go to the emergency department.  Pager Numbers  - Dr. Nehemiah Massed: [phone]  - Dr. Laurence Ferrari: [phone]  - Dr. Nicole Kindred: [phone]  In the event of inclement weather, please call our main line at [phone] for an update on the status of any delays or closures.  Dermatology Medication Tips: Please keep the boxes that topical medications come in in order to help keep track of the instructions about where and how to use these. Pharmacies typically print the medication instructions only on the boxes and not directly on the medication tubes.   If your medication is too expensive, please contact our office at [phone] option 4 or send [place] a message through [place].   We are unable to tell what your co-pay for medications will be in advance as this is different depending on your insurance coverage. However, we may be able to find a substitute medication at lower cost or fill out paperwork to get insurance to cover a needed medication.   If a prior authorization is required to get your medication covered by your insurance company, please allow [place] 1-2 business days to complete this process.  Drug prices often vary depending on where the prescription is filled and some pharmacies may offer cheaper prices.  The website www.goodrx.com contains coupons for medications through different pharmacies. The prices here do not account for what the cost may be with help from insurance (it may be cheaper with your insurance), but the website can give you  the price if you did not use any insurance.  - You can print the associated coupon and take it with your prescription to the pharmacy.  - You may also stop by our office during regular business hours and pick up a GoodRx coupon card.  - If you need your prescription sent electronically to a different pharmacy, notify our office through [facility] or by phone at [phone] option 4.     Si Usted Necesita Algo Despus de Su Visita  Tambin puede enviarnos un mensaje a travs de Pharmacist, community. Por lo general respondemos a los mensajes de MyChart en el transcurso de 1 a 2 das hbiles.  Para renovar recetas, por favor pida a su farmacia que se ponga en contacto con nuestra oficina. Harland Dingwall de fax es [place] [phone].  Si tiene un asunto urgente cuando la clnica est cerrada y que no puede esperar hasta el siguiente  da hbil, puede llamar/localizar a su doctor(a) al nmero que aparece a continuacin.   Por favor, tenga en cuenta que aunque hacemos todo lo posible para estar disponibles para asuntos urgentes fuera del horario de [place], no estamos disponibles las 24 horas del da, los 7 das de la [place].   Si tiene un problema urgente y no puede comunicarse con nosotros, puede optar por buscar atencin mdica  en el consultorio de su doctor(a), en una clnica privada, en un centro de atencin urgente o en una sala de emergencias.  Si tiene Engineering geologist, por favor llame inmediatamente al 911 o vaya a la sala de emergencias.  Nmeros de bper  - Dr. Nehemiah Massed: [phone]  - Dra. Moye: [phone]  - Dra. Nicole Kindred: [phone]  En caso de inclemencias del [place], por favor llame a Johnsie Kindred principal al [phone] para una actualizacin sobre el [place] de cualquier retraso o cierre.  Consejos para la medicacin en dermatologa: Por favor, guarde las cajas en las que vienen los medicamentos de uso tpico para ayudarle a seguir las instrucciones sobre dnde y  cmo usarlos. Las farmacias generalmente imprimen las instrucciones del medicamento slo en las cajas y no directamente en los tubos del [place].   Si su medicamento es muy caro, por favor, pngase en contacto con Zigmund Daniel llamando al [phone] y presione la opcin 4 o envenos un mensaje a travs de Pharmacist, community.   No podemos decirle cul ser su copago por los medicamentos por adelantado ya que esto es diferente dependiendo de la cobertura de su seguro. Sin embargo, es posible que podamos encontrar un medicamento sustituto a Electrical engineer un formulario para que el seguro cubra el medicamento que se considera necesario.   Si se requiere una autorizacin previa para que su compaa de seguros [place] su medicamento, por favor permtanos de 1 a 2 das hbiles para completar este proceso.  Los precios de los medicamentos varan con frecuencia dependiendo del Environmental consultant de dnde se surte la receta y alguna farmacias pueden ofrecer precios ms baratos.  El sitio web www.goodrx.com tiene cupones para medicamentos de Airline pilot. Los precios aqu no tienen en cuenta lo que podra costar con la ayuda del seguro (puede ser ms barato con su seguro), pero el sitio web puede darle el precio si no utiliz Research scientist (physical sciences).  - Puede imprimir el cupn correspondiente y llevarlo con su receta a la farmacia.  - Tambin puede pasar por nuestra oficina durante el horario de atencin regular y Charity fundraiser una tarjeta de cupones de GoodRx.  - Si necesita que su receta se enve electrnicamente a una farmacia diferente, informe a nuestra oficina a travs de MyChart de [place] o por telfono llamando al [phone] y presione la opcin 4.

## 2021-11-20 ENCOUNTER — Encounter: Payer: Self-pay | Admitting: Dermatology

## 2021-11-21 ENCOUNTER — Telehealth: Payer: Self-pay

## 2021-11-21 NOTE — Telephone Encounter (Signed)
Patient advised of BX results and information.

## 2021-11-21 NOTE — Telephone Encounter (Addendum)
Called patient regarding biopsy results. No answer. Left message to call office.     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 On the spectrum from normal mole to melanoma skin cancer, this is in between but it is much closer to a normal mole.  - These typically do not progress to melanoma or cause any trouble.  - People who have a history of atypical moles do have a slightly increased risk of developing melanoma somewhere on the body, so a yearly full body skin exam by a dermatologist is recommended.  - Monthly self skin checks and daily sun protection are also recommended.  - Please call if you notice a dark spot coming back where this biopsy was taken.  - Please also call if you notice any new or changing spots anywhere else on the body before your follow-up visit.  - Please call our office or send [place] a message if you have any questions or concerns about this biopsy result.     MAs please call. Thank you!

## 2021-12-10 ENCOUNTER — Ambulatory Visit: Payer: Self-pay

## 2021-12-10 NOTE — Telephone Encounter (Signed)
Chief Complaint: Runny nose, cough Symptoms: Saturday diarrhea, Sunday migraine Frequency: Runny nose started today, dry cough Pertinent Negatives: Patient denies SOB, other symptoms Disposition: [] ED /[] Urgent Care (no appt availability in office) / [] Appointment(In office/virtual)/ []   Virtual Care/ [x] Home Care/ [] Refused Recommended Disposition  Additional Notes: Has not COVID tested, advised she will need to see a provider, she may need covid testing, she says she's going back to work tomorrow and was just wanting to know if there is something OTC to take.  Message from [place] Callas sent at 12/10/2021 10:57 AM EST  PT called saying yesterday she had some stomach upset and vomiting but today it has been low grade fever, cough , congestion.  She wanted to be seen today by Dr.k but his first appt is the 27th.   CB#  [phone]     Reason for Disposition  Care advice for mild cough, questions about  Answer Assessment - Initial Assessment Questions 1. ONSET: "When did the nasal discharge start?"      Today 2. AMOUNT: "How much discharge is there?"      A lot 3. COUGH: "Do you have a cough?" If yes, ask: "Describe the color of your sputum" (clear, white, yellow, green)     Dry cough 4. RESPIRATORY DISTRESS: "Describe your breathing."      No 5. FEVER: "Do you have a fever?" If Yes, ask: "What is your temperature, how was it measured, and when did it start?"     No 6. SEVERITY: "Overall, how bad are you feeling right now?" (e.g., doesn't interfere with normal activities, staying home from school/work, staying in bed)      A little better, took the day off work 7. OTHER SYMPTOMS: "Do you have any other symptoms?" (e.g., sore throat, earache, wheezing, vomiting)     Diarrhea and vomiting on Saturday, Migraine on Sunday 8. PREGNANCY: "Is there any chance you are pregnant?" "When was your last menstrual period?"     N/A  Protocols used: Common Cold-A-AH

## 2021-12-10 NOTE — Telephone Encounter (Signed)
Patient called, left VM to return the call to the office to discuss symptoms with a nurse.    Message from  Callas sent at 12/10/2021 10:57 AM EST  PT called saying yesterday she had some stomach upset and vomiting but today it has been low grade fever, cough , congestion.  She wanted to be seen today by Dr.k but his first appt is the 27th.   CB#  [phone]

## 2021-12-11 NOTE — Telephone Encounter (Signed)
Sounds like acute viral symptoms - flu vs covid vs other virus. She would benefit from urgent care or walk in clinic Flu / COVID testing and treatment.  Not any real good OTC option to solve this issue. She should be treating fever with Tylenol / Ibuprofen as needed, and OTC cough meds can help otherwise if having congestion should try Nasal spray like Flonase OTC, Sudafed Decongestant.  Nobie Putnam, DO [facility] 12/11/2021, 1:29 PM

## 2021-12-13 ENCOUNTER — Telehealth: Payer: Self-pay | Admitting: Obstetrics and Gynecology

## 2021-12-13 NOTE — Telephone Encounter (Signed)
Pt called she was a former April Fleming pt, she stopped her birth control 4-5 months ago and still has not had a period since. Pt states that she took a pregnancy test and it was negative. Pt is concerned she has not had a period yet. Please Advise.

## 2021-12-18 MED ORDER — MEDROXYPROGESTERONE ACETATE 10 MG PO TABS: 10.0000 mg | ORAL_TABLET | Freq: Every day | ORAL | 0 refills | Status: DC

## 2021-12-18 NOTE — Telephone Encounter (Signed)
Medication sent to [facility]. Patient called. Patient aware.

## 2022-01-07 ENCOUNTER — Encounter: Payer: Self-pay | Admitting: Internal Medicine

## 2022-01-07 ENCOUNTER — Ambulatory Visit (INDEPENDENT_AMBULATORY_CARE_PROVIDER_SITE_OTHER): Payer: Medicaid Other | Admitting: Internal Medicine

## 2022-01-07 ENCOUNTER — Other Ambulatory Visit: Payer: Self-pay

## 2022-01-07 VITALS — BP 120/68 | HR 64 | Temp 97.3°F | Resp 18 | Ht 64.0 in | Wt 123.6 lb

## 2022-01-07 DIAGNOSIS — K582 Mixed irritable bowel syndrome: Secondary | ICD-10-CM

## 2022-01-07 DIAGNOSIS — G44201 Tension-type headache, unspecified, intractable: Secondary | ICD-10-CM

## 2022-01-07 NOTE — Progress Notes (Signed)
Subjective:    Patient ID: April Fleming, female    DOB: [birth year], 21 y.o.   MRN: [id number]  HPI  Patient presents the clinic today with complaint of a headache, dizziness, nausea, constipation diarrhea.  This started 3 days ago.  She describes the pain as sharp and shooting at the back of her head.  She reports associated neck pain.  She denies vision changes, sensitivity to light, sound, runny nose, nasal congestion, ear pain, sore throat, cough, shortness of breath, chest pain vomiting or blood in her stool.  She denies fever, chills or body aches.  She has a history of migraines for which she typically takes Excedrin Migraine with good relief of symptoms.  She has not followed with neurology. She take Bentyl as needed.  Review of Systems     Past Medical History:  Diagnosis Date   Abdominal pain    COVID-19 12/23/2020   Dysplastic nevus 11/08/2021   mild, R shoulder, recheck at followup   Vomiting     Current Outpatient Medications  Medication Sig Dispense Refill   acetaminophen (TYLENOL) 500 MG tablet Take 500 mg by mouth every 6 (six) hours as needed. (Patient not taking: Reported on 11/08/2021)     aspirin-acetaminophen-caffeine (EXCEDRIN MIGRAINE) 250-250-65 MG tablet Take by mouth every 6 (six) hours as needed for headache.     dicyclomine (BENTYL) 10 MG capsule Take 10 mg by mouth 3 (three) times daily as needed for spasms.     ibuprofen (ADVIL) 200 MG tablet Take 200 mg by mouth every 6 (six) hours as needed.     medroxyPROGESTERone (PROVERA) 10 MG tablet Take 1 tablet (10 mg total) by mouth daily. 10 tablet 0   methocarbamol (ROBAXIN) 500 MG tablet Take 1,000 mg by mouth 2 (two) times daily.     Multiple Vitamin (MULTIVITAMIN) tablet Take 1 tablet by mouth daily. (Patient not taking: Reported on 11/08/2021)     Omega-3 Fatty Acids (FISH OIL ADULT GUMMIES PO) Take by mouth daily. (Patient not taking: Reported on 11/08/2021)     ondansetron (ZOFRAN-ODT) 4 MG  disintegrating tablet Take by mouth.     Peppermint Oil 90 MG CPCR Take 1 capsule by mouth 3 (three) times daily as needed. (Patient not taking: Reported on 11/08/2021)     No current facility-administered medications for this visit.    No Known Allergies  Family History  Problem Relation Age of Onset   COPD Mother    Melanoma Father 53   Melanoma Maternal Grandmother    Healthy Maternal Grandfather    Intellectual disability Maternal Grandfather    Dementia Paternal Grandmother    Healthy Other    Healthy Other     Social History   Socioeconomic History   Marital status: Single    Spouse name: Not on file   Number of children: Not on file   Years of education: Not on file   Highest education level: High school graduate  Occupational History   Not on file  Tobacco Use   Smoking status: Never   Smokeless tobacco: Never  Vaping Use   Vaping Use: Former   Quit date: 05/12/2019   Substances: Nicotine, Flavoring   Devices: self-filled vaporizer  Substance and Sexual Activity   Alcohol use: Yes    Comment: occasionally   Drug use: Never   Sexual activity: Yes    Birth control/protection: Pill  Other Topics Concern   Not on file  Social History Narrative   Not on  file   Social Determinants of Health   Financial Resource Strain: Not on file  Food Insecurity: Not on file  Transportation Needs: Not on file  Physical Activity: Not on file  Stress: Not on file  Social Connections: Not on file  Intimate Partner Violence: Not on file     Constitutional: Patient reports headache.  Denies fever, malaise, fatigue, or abrupt weight changes.  HEENT: Denies eye pain, eye redness, ear pain, ringing in the ears, wax buildup, runny nose, nasal congestion, bloody nose, or sore throat. Respiratory: Denies difficulty breathing, shortness of breath, cough or sputum production.   Cardiovascular: Denies chest pain, chest tightness, palpitations or swelling in the hands or feet.   Gastrointestinal: Patient reports nausea, diarrhea and constipation.  Denies abdominal pain, bloating, or blood in the stool.  GU: Denies urgency, frequency, pain with urination, burning sensation, blood in urine, odor or discharge. Musculoskeletal: Patient reports neck pain.  Denies decrease in range of motion, difficulty with gait, or joint pain and swelling.  Skin: Denies redness, rashes, lesions or ulcercations.  Neurological: Patient reports dizziness.  Denies difficulty with memory, difficulty with speech or problems with balance and coordination.  Psych: Denies anxiety, depression, SI/HI.  No other specific complaints in a complete review of systems (except as listed in HPI above).  Objective:   Physical Exam   BP 120/68 (BP Location: Right Arm, Patient Position: Sitting, Cuff Size: Normal)    Pulse 64    Temp (!) 97.3 F (36.3 C) (Temporal)    Resp 18    Ht 5\' 4"  (1.626 m)    Wt 123 lb 9.6 oz (56.1 kg)    SpO2 100%    BMI 21.22 kg/m   Wt Readings from Last 3 Encounters:  09/20/21 126 lb 9.6 oz (57.4 kg)  05/10/21 126 lb (57.2 kg)  04/23/21 127 lb 9.6 oz (57.9 kg)    General: Appears her stated age, well developed, well nourished in NAD. Skin: Warm, dry and intact. No rashes noted. HEENT: Head: normal shape and size; Eyes: PERRLA and EOMs intact;  Cardiovascular: Normal rate. Pulmonary/Chest: Normal effort. Abdomen: Normal bowel sounds Musculoskeletal: Normal flexion, extension and rotation of the cervical spine.  No bony tenderness noted over the cervical spine.  No pain with palpation of bilateral paracervical muscles.  No difficulty with gait.  Neurological: Alert and oriented. Cranial nerves II-XII grossly intact. Coordination normal.  Psychiatric: Anxious.  BMET    Component Value Date/Time   NA 137 02/07/2021 1319   K 3.3 (L) 02/07/2021 1319   CL 105 02/07/2021 1319   CO2 21 (L) 02/07/2021 1319   GLUCOSE 83 02/07/2021 1319   BUN 9 02/07/2021 1319   CREATININE  0.54 02/07/2021 1319   CREATININE 0.64 05/26/2019 0925   CALCIUM 9.5 02/07/2021 1319   GFRNONAA >60 02/07/2021 1319   GFRNONAA 129 05/26/2019 0925   GFRAA >60 07/03/2020 1119   GFRAA 150 05/26/2019 0925    Lipid Panel  No results found for: CHOL, TRIG, HDL, CHOLHDL, VLDL, LDLCALC  CBC    Component Value Date/Time   WBC 7.3 02/07/2021 1319   RBC 4.53 02/07/2021 1319   HGB 14.0 02/07/2021 1319   HCT 39.8 02/07/2021 1319   PLT 259 02/07/2021 1319   MCV 87.9 02/07/2021 1319   MCH 30.9 02/07/2021 1319   MCHC 35.2 02/07/2021 1319   RDW 13.2 02/07/2021 1319   LYMPHSABS 2,006 05/26/2019 0925   EOSABS 101 05/26/2019 0925   BASOSABS 48 05/26/2019 0925  Hgb A1C No results found for: HGBA1C         Assessment & Plan:   Acute Headache:  Likely tension She reports this has improved She has a family history of brain aneurysms and is concerned that that could be what is going on CBC and BMET today  IBS, Alternating Constipation and Diarrhea:  Encouraged low FODMAP diet OK to take Bentyl daily if needed Discussed treatment with SSRI but she would like to hold off at this time  Will follow up after labs with further recommendation and treatment plan [name], NP This visit occurred during the SARS-CoV-2 public health emergency.  Safety protocols were in place, including screening questions prior to the visit, additional usage of staff PPE, and extensive cleaning of exam room while observing appropriate contact time as indicated for disinfecting solutions.

## 2022-01-07 NOTE — Patient Instructions (Signed)
Diet for Irritable Bowel Syndrome When you have irritable bowel syndrome (IBS), it is very important to eat the foods and follow the eating habits that are best for your condition. IBS may cause various symptoms such as pain in the abdomen, constipation, or diarrhea. Choosing the right foods can help to ease the discomfort from these symptoms. Work with your health care provider and diet and nutrition specialist (dietitian) to find the eating plan that will help to control your symptoms. What are tips for following this plan?   Keep a food diary. This will help you identify foods that cause symptoms. Write down: What you eat and when you eat it. What symptoms you have. When symptoms occur in relation to your meals, such as "pain in abdomen 2 hours after dinner." Eat your meals slowly and in a relaxed setting. Aim to eat 5-6 small meals per day. Do not skip meals. Drink enough fluid to keep your urine pale yellow. Ask your health care provider if you should take an over-the-counter probiotic to help restore healthy bacteria in your gut (digestive tract). Probiotics are foods that contain good bacteria and yeasts. Your dietitian may have specific dietary recommendations for you based on your symptoms. He or she may recommend that you: Avoid foods that cause symptoms. Talk with your dietitian about other ways to get the same nutrients that are in those problem foods. Avoid foods with gluten. Gluten is a protein that is found in rye, wheat, and barley. Eat more foods that contain soluble fiber. Examples of foods with high soluble fiber include oats, seeds, and certain fruits and vegetables. Take a fiber supplement if directed by your dietitian. Reduce or avoid certain foods called FODMAPs. These are foods that contain carbohydrates that are hard to digest. Ask your doctor which foods contain these carbohydrates. What foods are not recommended? The following are some foods and drinks that may make your  symptoms worse: Fatty foods, such as french fries. Foods that contain gluten, such as pasta and cereal. Dairy products, such as milk, cheese, and ice cream. Chocolate. Alcohol. Products with caffeine, such as coffee. Carbonated drinks, such as soda. Foods that are high in FODMAPs. These include certain fruits and vegetables. Products with sweeteners such as honey, high fructose corn syrup, sorbitol, and mannitol. The items listed above may not be a complete list of foods and beverages you should avoid. Contact a dietitian for more information. What foods are good sources of fiber? Your health care provider or dietitian may recommend that you eat more foods that contain fiber. Fiber can help to reduce constipation and other IBS symptoms. Add foods with fiber to your diet a little at a time so your body can get used to them. Too much fiber at one time might cause gas and swelling of your abdomen. The following are some foods that are good sources of fiber: Berries, such as raspberries, strawberries, and blueberries. Tomatoes. Carrots. Brown rice. Oats. Seeds, such as chia and pumpkin seeds. The items listed above may not be a complete list of recommended sources of fiber. Contact your dietitian for more options. Where to find more information International Foundation for Functional Gastrointestinal Disorders: www.iffgd.[employer] of Diabetes and Digestive and Kidney Diseases: [employer] Summary When you have irritable bowel syndrome (IBS), it is very important to eat the foods and follow the eating habits that are best for your condition. IBS may cause various symptoms such as pain in the abdomen, constipation, or diarrhea. Choosing the right  foods can help to ease the discomfort that comes from symptoms. Keep a food diary. This will help you identify foods that cause symptoms. Your health care provider or diet and nutrition specialist (dietitian) may recommend that you  eat more foods that contain fiber. This information is not intended to replace advice given to you by your health care provider. Make sure you discuss any questions you have with your health care provider. Document Revised: 08/03/2020 Document Reviewed: 08/10/2020 Elsevier Patient Education  2022 [employer].

## 2022-01-08 ENCOUNTER — Other Ambulatory Visit: Payer: Medicaid Other

## 2022-01-08 DIAGNOSIS — G44201 Tension-type headache, unspecified, intractable: Secondary | ICD-10-CM | POA: Diagnosis not present

## 2022-01-08 LAB — BASIC METABOLIC PANEL WITH GFR
BUN: 9 mg/dL (ref 7–25)
CO2: 28 mmol/L (ref 20–32)
Calcium: 10 mg/dL (ref 8.6–10.2)
Chloride: 103 mmol/L (ref 98–110)
Creat: 0.65 mg/dL (ref 0.50–0.96)
Glucose, Bld: 91 mg/dL (ref 65–139)
Potassium: 4.5 mmol/L (ref 3.5–5.3)
Sodium: 138 mmol/L (ref 135–146)
eGFR: 128 mL/min/{1.73_m2} (ref >=60)

## 2022-01-31 ENCOUNTER — Other Ambulatory Visit: Payer: Self-pay

## 2022-01-31 ENCOUNTER — Ambulatory Visit (INDEPENDENT_AMBULATORY_CARE_PROVIDER_SITE_OTHER): Payer: Medicaid Other | Admitting: Obstetrics

## 2022-01-31 ENCOUNTER — Encounter: Payer: Self-pay | Admitting: Obstetrics

## 2022-01-31 VITALS — BP 130/80 | HR 76 | Ht 63.0 in | Wt 123.5 lb

## 2022-01-31 DIAGNOSIS — Z3202 Encounter for pregnancy test, result negative: Secondary | ICD-10-CM

## 2022-01-31 DIAGNOSIS — N926 Irregular menstruation, unspecified: Secondary | ICD-10-CM

## 2022-01-31 LAB — POCT URINE PREGNANCY: Preg Test, Ur: NEGATIVE

## 2022-01-31 NOTE — Progress Notes (Signed)
SUBJECTIVE  HPI  April Fleming is a 21 y.o.-year-old female who presents to discuss her irregular periods today. She had been on various OCPs since approximately age 2 d/t oligomenorrhea. She reports having a period every 2-3 months before starting OCPs.  While she was on OCPs, she had a regular monthly period that was very heavy (soaking a super tampon q 1-2/hours for several days) with painful cramping. She discontinued OCPs in August due to her difficult cycles and a desire to become pregnant. She had no period for several months. After a progesterone challenge, she had a period in late December, and she has not menstruated since. She reports a history of migraines without aura and IBS.  Medical/Surgical History Past Medical History:  Diagnosis Date   Abdominal pain    ASD (atrial septal defect)    Costochondritis    COVID-19 12/23/2020   Dysplastic nevus 11/08/2021   mild, R shoulder, recheck at followup   IBS (irritable bowel syndrome)    Irregular periods/menstrual cycles    Migraine headache without aura    Vomiting    Past Surgical History:  Procedure Laterality Date   WISDOM TOOTH EXTRACTION       Obstetric History OB History     Gravida  0   Para  0   Term  0   Preterm  0   AB  0   Living  0      SAB  0   IAB  0   Ectopic  0   Multiple  0   Live Births  0            GYN/Menstrual History LMP: Late December Has only menstruated once since stopping OCPs 07/2021 Last Pap: 02/22/21. Normal Contraception: none   Current Medications Outpatient Medications Prior to Visit  Medication Sig   acetaminophen (TYLENOL) 500 MG tablet Take 500 mg by mouth every 6 (six) hours as needed.   aspirin-acetaminophen-caffeine (EXCEDRIN MIGRAINE) 250-250-65 MG tablet Take by mouth every 6 (six) hours as needed for headache.   dicyclomine (BENTYL) 10 MG capsule Take 10 mg by mouth 3 (three) times daily as needed for spasms.   ibuprofen (ADVIL) 200 MG tablet  Take 200 mg by mouth every 6 (six) hours as needed.   Multiple Vitamin (MULTIVITAMIN) tablet Take 1 tablet by mouth daily.   medroxyPROGESTERone (PROVERA) 10 MG tablet Take 1 tablet (10 mg total) by mouth daily. (Patient not taking: Reported on 01/07/2022)   methocarbamol (ROBAXIN) 500 MG tablet Take 1,000 mg by mouth 2 (two) times daily as needed. (Patient not taking: Reported on 01/31/2022)   Omega-3 Fatty Acids (FISH OIL ADULT GUMMIES PO) Take by mouth daily. (Patient not taking: Reported on 01/31/2022)   ondansetron (ZOFRAN-ODT) 4 MG disintegrating tablet Take by mouth. (Patient not taking: Reported on 01/31/2022)   Peppermint Oil 90 MG CPCR Take 1 capsule by mouth 3 (three) times daily as needed. (Patient not taking: Reported on 01/31/2022)   No facility-administered medications prior to visit.      Upstream - 01/31/22 1012       Pregnancy Intention Screening   Does the patient want to become pregnant in the next year? Yes    Does the patient's partner want to become pregnant in the next year? Yes    Would the patient like to discuss contraceptive options today? No      Contraception Wrap Up   Current Method No Contraceptive Precautions    End Method No Contraception  Precautions    Contraception Counseling Provided No            The pregnancy intention screening data noted above was reviewed. Potential methods of contraception were discussed. The patient elected to proceed with No Contraception Precautions.   ROS History obtained from the patient General ROS: negative for - chills, fatigue, fever, or malaise Endocrine ROS: negative for - breast changes, galactorrhea, hot flashes, palpitations, polydipsia/polyuria, or unexpected weight changes Breast ROS: negative for breast lumps Respiratory ROS: no cough, shortness of breath, or wheezing Cardiovascular ROS: no chest pain or dyspnea on exertion Genito-Urinary ROS: no dysuria, trouble voiding, or hematuria negative for - pelvic pain  or vulvar/vaginal symptoms, no hirsutism  Depression screen [facility] 2/9 05/10/2021 02/22/2021 09/11/2020 09/01/2020 05/18/2019  Decreased Interest 0 0 0 1 0  Down, Depressed, Hopeless 0 0 0 1 0  PHQ - 2 Score 0 0 0 2 0  Altered sleeping - - 1 2 -  Tired, decreased energy - - 1 2 -  Change in appetite - - 0 1 -  Feeling bad or failure about yourself  - - 0 0 -  Trouble concentrating - - 0 0 -  Moving slowly or fidgety/restless - - 1 1 -  Suicidal thoughts - - 0 0 -  PHQ-9 Score - - 3 8 -  Difficult doing work/chores - - Not difficult at all Somewhat difficult -     OBJECTIVE  Last Weight  Most recent update: 01/31/2022  8:24 AM    Weight  56 kg (123 lb 8 oz)             Body mass index is 21.88 kg/m.    BP 130/80    Pulse 76    Ht 5\' 3"  (1.6 m)    Wt 123 lb 8 oz (56 kg)    LMP  (LMP Unknown)    BMI 21.88 kg/m   UPT negative.  Physical exam not medically indicated.  ASSESSMENT  1) Oligomenorrhea 2) Desires pregnancy in the next year  PLAN 1) PCOS panel, TSH, HbA1C drawn today. Pelvic US ordered. 2) Discussed possible causes of anovulation. Once labs and [place] are complete, we will schedule an appointment to discuss the results and make a plan.   Lloyd Huger, CNM

## 2022-02-05 ENCOUNTER — Ambulatory Visit (INDEPENDENT_AMBULATORY_CARE_PROVIDER_SITE_OTHER): Payer: Medicaid Other

## 2022-02-05 ENCOUNTER — Other Ambulatory Visit: Payer: Self-pay

## 2022-02-05 DIAGNOSIS — N926 Irregular menstruation, unspecified: Secondary | ICD-10-CM | POA: Diagnosis not present

## 2022-02-12 LAB — HEMOGLOBIN A1C
Est. average glucose Bld gHb Est-mCnc: 100 mg/dL
Hgb A1c MFr Bld: 5.1 % (ref 4.8–5.6)

## 2022-02-12 LAB — PCOS DIAGNOSTIC PROFILE
17-Alpha-Hydroxyprogesterone: 88 ng/dL
ANTI-MULLERIAN HORMONE (AMH): 15.3 ng/mL — ABNORMAL HIGH
DHEA-Sulfate, LCMS: 226 ug/dL
Estradiol, Serum, MS: 50 pg/mL
Follicle Stimulating Hormone: 7.8 m[IU]/mL
Free Testosterone, Serum: 7.3 pg/mL — ABNORMAL HIGH
Luteinizing Hormone (LH) ECL: 14 m[IU]/mL
Prolactin: 12.6 ng/mL
Sex Hormone Binding Globulin: 65.2 nmol/L
TSH: 1.1 uU/mL
Testosterone, Serum (Total): 81 ng/dL — ABNORMAL HIGH
Testosterone-% Free: 0.9 %

## 2022-02-12 LAB — THYROID PANEL WITH TSH
Free Thyroxine Index: 2 (ref 1.2–4.9)
T3 Uptake Ratio: 24 % (ref 24–39)
T4, Total: 8.5 ug/dL (ref 4.5–12.0)
TSH: 0.999 u[IU]/mL (ref 0.450–4.500)

## 2022-02-14 ENCOUNTER — Telehealth: Payer: Self-pay | Admitting: Obstetrics

## 2022-02-14 MED ORDER — CRYSELLE-28 0.3-30 MG-MCG PO TABS: 1.0000 | ORAL_TABLET | Freq: Every day | ORAL | 0 refills | Status: DC

## 2022-02-14 NOTE — Telephone Encounter (Signed)
Pt states that she got results on mychart and is requesting a call from clinical staff to review. Please Advise.

## 2022-02-14 NOTE — Telephone Encounter (Signed)
Spoke with April Fleming on the phone to discuss lab results. Elevated testosterone and amenorrhea and likely PCOS diagnosis. Plan to take OCPs x 3-4 months to regulate cycles, then attempt conception right away after stopping. If she does not conceive and cycles do not return, consider adding Femara. April Fleming is in agreement with plan. Questions answered. F/u for prenatal care or further assistance with conception.  Lloyd Huger, CNM

## 2022-02-15 ENCOUNTER — Telehealth: Payer: Self-pay | Admitting: Obstetrics

## 2022-02-15 NOTE — Telephone Encounter (Signed)
Pt called this morning she was having trouble with mychart- she stated that she is trying to conceive and was dx with PCOS- she talked with her partner and they are not wanting to do birth control for 3 months then try again- she wanted to know about a supplement called "Pregnitube" - she was wondering what you thoughts were on this or if you had heard of it. She wanted to know if there were any other options. Please Advise.

## 2022-02-15 NOTE — Telephone Encounter (Signed)
I was not familiar with Pregnitude, but I looked it up and I don't think there is any harm in trying it. I'm not sure if it will be useful or not. She could also use Vitex (chasteberry) supplements to try to regulate her hormones. There is a lubricant called PreSeed that some people use when trying to conceive.

## 2022-02-18 ENCOUNTER — Ambulatory Visit: Payer: Self-pay | Admitting: *Deleted
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
## 2022-02-18 NOTE — Telephone Encounter (Signed)
LM with patient relaying message.
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
## 2022-02-21 ENCOUNTER — Encounter: Payer: Self-pay | Admitting: Family Medicine

## 2022-02-21 ENCOUNTER — Telehealth: Payer: Medicaid Other | Admitting: Family Medicine

## 2022-02-21 ENCOUNTER — Other Ambulatory Visit: Payer: Self-pay

## 2022-02-21 ENCOUNTER — Ambulatory Visit (INDEPENDENT_AMBULATORY_CARE_PROVIDER_SITE_OTHER): Payer: Medicaid Other | Admitting: Family Medicine

## 2022-02-21 VITALS — BP 110/76 | HR 79 | Ht 63.0 in | Wt 125.0 lb

## 2022-02-21 DIAGNOSIS — H833X2 Noise effects on left inner ear: Secondary | ICD-10-CM | POA: Diagnosis not present

## 2022-02-21 DIAGNOSIS — H9312 Tinnitus, left ear: Secondary | ICD-10-CM
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
## 2022-02-22 ENCOUNTER — Encounter: Payer: Self-pay | Admitting: Family Medicine

## 2022-02-28 ENCOUNTER — Encounter: Payer: Medicaid Other | Admitting: Certified Nurse Midwife

## 2022-03-14 ENCOUNTER — Ambulatory Visit (INDEPENDENT_AMBULATORY_CARE_PROVIDER_SITE_OTHER): Payer: Medicaid Other | Admitting: Obstetrics

## 2022-03-14 ENCOUNTER — Other Ambulatory Visit: Payer: Self-pay

## 2022-03-14 VITALS — BP 122/82 | HR 69 | Ht 63.0 in | Wt 125.4 lb

## 2022-03-14 DIAGNOSIS — R102 Pelvic and perineal pain: Secondary | ICD-10-CM

## 2022-03-14 MED ORDER — MEDROXYPROGESTERONE ACETATE 10 MG PO TABS: 10.0000 mg | ORAL_TABLET | Freq: Every day | ORAL | 0 refills | Status: DC

## 2022-03-14 NOTE — Progress Notes (Signed)
GYN ENCOUNTER ? ?Encounter for Pelvic Pain ? ?Subjective ? ?HPI: April Fleming is a 21 y.o. G0P0000 who presents today for pelvic pain. She reports that for the past week, she has had sharp, achy pain in her left lower quadrant that comes and goes. It lasted most of the day for the first few days, and now comes every few hours. She denies any injury or trauma, but states she did push mow her lawn on the day the pain started. The pain is less intense now but still occurs daily. It gets worse when sitting for long periods. She also reports breast tenderness. She denies any vaginal bleeding, spotting, or discharge. She denies UTI symptoms. She has been attempting to conceive but has not had a period since December, when she did a progesterone challenge. She has had several negative pregnancy tests. She reports daily bowel movements and a good appetite. At her last visit, we had discussed going on OCPs for 3-4 months to regulate her cycle and then attempting to conceive. She decided not to use this method but has been taking a vitamin with vitex. She is not currently taking any other medications. ? ?Past Medical History:  ?Diagnosis Date  ? Abdominal pain   ? ASD (atrial septal defect)   ? Costochondritis   ? COVID-19 12/23/2020  ? Dysplastic nevus 11/08/2021  ? mild, R shoulder, recheck at followup  ? IBS (irritable bowel syndrome)   ? Irregular periods/menstrual cycles   ? Migraine headache without aura   ? Vomiting   ? ?Past Surgical History:  ?Procedure Laterality Date  ? WISDOM TOOTH EXTRACTION    ? ?OB History   ? ? Gravida  ?0  ? Para  ?0  ? Term  ?0  ? Preterm  ?0  ? AB  ?0  ? Living  ?0  ?  ? ? SAB  ?0  ? IAB  ?0  ? Ectopic  ?0  ? Multiple  ?0  ? Live Births  ?0  ?   ?  ?  ? ?No Known Allergies ? ?LMP in December 2022 ? ? ?Negative except sharp pain in LLQ/groin ?History obtained from the patient ?Genito-Urinary ROS: no dysuria, trouble voiding, or hematuria ?positive for - vulvar/vaginal  symptoms ? ?Objective ? ?BP 122/82   Pulse 69   Ht '5\' 3"'$  (1.6 m)   Wt 125 lb 6.4 oz (56.9 kg)   LMP  (LMP Unknown)   BMI 22.21 kg/m?  ? ?Physical Exam: ?General appearance: alert, cooperative, no distress, appears stated age ?Abdomen: soft, non-tender. Bowel sounds normal. No masses,  no organomegaly. Mild tenderness to palpation adjacent to left hip bone. No guarding or rebound tenderness.  ?Pelvic: no CMT, uterus normal size, shape, and consistency, no adnexal masses or tenderness, normal external genitalia ? ?Assessment ?1) Pelvic pain, musculoskeletal versus ovulation pain ?2) Secondary amenorrhea/PCOS ? ?Plan ?1) Comfort measures:Ibuprofen, heating pad, rest. Return if pain worsens or becomes severe. ?2) BhCG drawn today. If negative, recommend either starting OCPs for 3-4 or progesterone challenge and then attempting to conceive. Discussed medications to stimulate ovulate. Elizaveta would like to try Provera. Rx sent to pharmacy pending lab results. ? ?Lloyd Huger, CNM ? ? ?  ?

## 2022-03-15 ENCOUNTER — Encounter: Payer: Self-pay | Admitting: Obstetrics

## 2022-03-15 LAB — HUMAN CHORIONIC GONADOTROPIN(HCG),B-SUBUNIT,QUANTITATIVE): HCG, Beta Chain, Quant, S: <1 m[IU]/mL

## 2022-03-16 LAB — URINE CULTURE

## 2022-04-25 ENCOUNTER — Telehealth: Payer: Self-pay | Admitting: Obstetrics

## 2022-04-25 NOTE — Telephone Encounter (Signed)
Patient is trying to conceive and states that provider offered to start her on some ovulation medication to help with this process if previous measures did not work. Patient is wanting to proceed with the next steps. Please advise.  ?

## 2022-04-27 NOTE — Telephone Encounter (Signed)
Hi! I'd like her to schedule an appt so we can do a full rundown on the meds - video visit is fine if she prefers. ? ?Thanks! ? ?Missy

## 2022-04-29 NOTE — Telephone Encounter (Signed)
Schedule VV 5/26 @ 4pm- LM for pt  ?

## 2022-05-17 ENCOUNTER — Encounter: Payer: Self-pay | Admitting: Obstetrics

## 2022-05-17 ENCOUNTER — Telehealth (INDEPENDENT_AMBULATORY_CARE_PROVIDER_SITE_OTHER): Payer: Medicaid Other | Admitting: Obstetrics

## 2022-05-17 DIAGNOSIS — N970 Female infertility associated with anovulation: Secondary | ICD-10-CM | POA: Diagnosis not present

## 2022-05-17 MED ORDER — LETROZOLE 2.5 MG PO TABS: 2.5000 mg | ORAL_TABLET | Freq: Every day | ORAL | 0 refills | Status: DC

## 2022-05-17 MED ORDER — MEDROXYPROGESTERONE ACETATE 10 MG PO TABS
10.0000 mg | ORAL_TABLET | Freq: Every day | ORAL | 0 refills | Status: DC
Start: 2022-05-17 — End: 2022-07-04

## 2022-05-17 NOTE — Progress Notes (Signed)
Virtual Visit via Video Note  I connected with [name] on 05/17/22 at 10:45 AM EDT by a video enabled telemedicine application and verified that I am speaking with the correct person using two identifiers.  Location: Patient: Musician (work) Provider: Office   I discussed the limitations of evaluation and management by telemedicine and the availability of in person appointments. The patient expressed understanding and agreed to proceed.  History of Present Illness: NAIDELYN PARRELLA is a 21 y.o. G0P0000 with PCOS who has been anovulatory and would like to become pregnant. She stimulated a withdrawal bleed with Provera but did not ovulate. She would like to start on ovulation-stimulating medication.  Observations/Objective: Provera challenge done 03/14/22. She began bleeding on day 8 and has not menstruated since. Ovulation predictor kits indicated that she likely did not ovulate.  Assessment and Plan: Recommended a 18-monthcourse of OCPs before starting letrozole to increase chances of conception. Gatha prefers to initiate a bleed with Provera again and then start the letrozole.  Follow Up Instructions: Follow up in 6 weeks with either pregnancy confirmation or video visit to discuss medication changes.   I discussed the assessment and treatment plan with the patient. The patient was provided an opportunity to ask questions and all were answered. The patient agreed with the plan and demonstrated an understanding of the instructions.   The patient was advised to call back or seek an in-person evaluation if the symptoms worsen or if the condition fails to improve as anticipated.  I provided 6 minutes of non-face-to-face time during this encounter.   MLurlean Horns CNM

## 2022-07-02 ENCOUNTER — Inpatient Hospital Stay: Payer: Medicaid Other | Admitting: Family Medicine

## 2022-07-02 ENCOUNTER — Encounter: Payer: Self-pay | Admitting: Emergency Medicine

## 2022-07-02 ENCOUNTER — Emergency Department
Admission: EM | Admit: 2022-07-02 | Discharge: 2022-07-02 | Disposition: A | Discharge status: Home / Self Care | Payer: Medicaid Other | Source: Home / Self Care | Attending: Emergency Medicine | Admitting: Emergency Medicine

## 2022-07-02 ENCOUNTER — Ambulatory Visit: Payer: Self-pay

## 2022-07-02 ENCOUNTER — Emergency Department: Payer: Medicaid Other

## 2022-07-02 ENCOUNTER — Emergency Department
Admission: EM | Admit: 2022-07-02 | Discharge: 2022-07-02 | Disposition: A | Discharge status: Home / Self Care | Payer: Medicaid Other | Attending: Emergency Medicine | Admitting: Emergency Medicine

## 2022-07-02 ENCOUNTER — Other Ambulatory Visit: Payer: Self-pay

## 2022-07-02 DIAGNOSIS — R1031 Right lower quadrant pain: Secondary | ICD-10-CM | POA: Insufficient documentation

## 2022-07-02 DIAGNOSIS — R112 Nausea with vomiting, unspecified: Secondary | ICD-10-CM | POA: Diagnosis not present

## 2022-07-02 DIAGNOSIS — N83209 Unspecified ovarian cyst, unspecified side: Secondary | ICD-10-CM

## 2022-07-02 DIAGNOSIS — R109 Unspecified abdominal pain: Secondary | ICD-10-CM | POA: Diagnosis not present

## 2022-07-02 DIAGNOSIS — N83201 Unspecified ovarian cyst, right side: Secondary | ICD-10-CM | POA: Diagnosis not present

## 2022-07-02 LAB — CBC WITH DIFFERENTIAL/PLATELET
Abs Immature Granulocytes: 0.02 10*3/uL (ref 0.00–0.07)
Basophils Absolute: 0.1 10*3/uL (ref 0.0–0.1)
Basophils Relative: 1 %
Eosinophils Absolute: 0.1 10*3/uL (ref 0.0–0.5)
Eosinophils Relative: 1 %
HCT: 38.7 % (ref 36.0–46.0)
Hemoglobin: 13.2 g/dL (ref 12.0–15.0)
Immature Granulocytes: 0 %
Lymphocytes Relative: 24 %
Lymphs Abs: 2 10*3/uL (ref 0.7–4.0)
MCH: 30.4 pg (ref 26.0–34.0)
MCHC: 34.1 g/dL (ref 30.0–36.0)
MCV: 89.2 fL (ref 80.0–100.0)
Monocytes Absolute: 0.6 10*3/uL (ref 0.1–1.0)
Monocytes Relative: 7 %
Neutro Abs: 5.7 10*3/uL (ref 1.7–7.7)
Neutrophils Relative %: 67 %
Platelets: 235 10*3/uL (ref 150–400)
RBC: 4.34 MIL/uL (ref 3.87–5.11)
RDW: 12 % (ref 11.5–15.5)
WBC: 8.5 10*3/uL (ref 4.0–10.5)
nRBC: 0 % (ref 0.0–0.2)

## 2022-07-02 LAB — COMPREHENSIVE METABOLIC PANEL
ALT: 20 U/L (ref 0–44)
AST: 25 U/L (ref 15–41)
Albumin: 4.8 g/dL (ref 3.5–5.0)
Alkaline Phosphatase: 49 U/L (ref 38–126)
Anion gap: 8 (ref 5–15)
BUN: 11 mg/dL (ref 6–20)
CO2: 24 mmol/L (ref 22–32)
Calcium: 9.6 mg/dL (ref 8.9–10.3)
Chloride: 107 mmol/L (ref 98–111)
Creatinine, Ser: 0.67 mg/dL (ref 0.44–1.00)
GFR, Estimated: >60 mL/min (ref >60)
Glucose, Bld: 100 mg/dL — ABNORMAL HIGH (ref 70–99)
Potassium: 3.9 mmol/L (ref 3.5–5.1)
Sodium: 139 mmol/L (ref 135–145)
Total Bilirubin: 0.5 mg/dL (ref 0.3–1.2)
Total Protein: 8 g/dL (ref 6.5–8.1)

## 2022-07-02 LAB — URINALYSIS, ROUTINE W REFLEX MICROSCOPIC
Bilirubin Urine: NEGATIVE
Glucose, UA: NEGATIVE mg/dL
Hgb urine dipstick: NEGATIVE
Ketones, ur: NEGATIVE mg/dL
Leukocytes,Ua: NEGATIVE
Nitrite: NEGATIVE
Protein, ur: NEGATIVE mg/dL
Specific Gravity, Urine: 1.001 — ABNORMAL LOW (ref 1.005–1.030)
pH: 6 (ref 5.0–8.0)

## 2022-07-02 LAB — CBC
HCT: 43.6 % (ref 36.0–46.0)
Hemoglobin: 14.6 g/dL (ref 12.0–15.0)
MCH: 29.8 pg (ref 26.0–34.0)
MCHC: 33.5 g/dL (ref 30.0–36.0)
MCV: 89 fL (ref 80.0–100.0)
Platelets: 223 10*3/uL (ref 150–400)
RBC: 4.9 MIL/uL (ref 3.87–5.11)
RDW: 12.1 % (ref 11.5–15.5)
WBC: 6.1 10*3/uL (ref 4.0–10.5)
nRBC: 0 % (ref 0.0–0.2)

## 2022-07-02 LAB — LIPASE, BLOOD: Lipase: 33 U/L (ref 11–51)

## 2022-07-02 LAB — PREGNANCY, URINE: Preg Test, Ur: NEGATIVE

## 2022-07-02 MED ORDER — ONDANSETRON HCL 4 MG/2ML IJ SOLN
4.0000 mg | Freq: Once | INTRAMUSCULAR | Status: AC
  Administered 2022-07-02: 4 mg via INTRAVENOUS
  Filled 2022-07-02: qty 2

## 2022-07-02 MED ORDER — HYDROCODONE-ACETAMINOPHEN 5-325 MG PO TABS
1.0000 | ORAL_TABLET | ORAL | 0 refills | Status: DC | PRN
Start: 2022-07-02 — End: 2022-08-22

## 2022-07-02 MED ORDER — IOHEXOL 300 MG/ML  SOLN
80.0000 mL | Freq: Once | INTRAMUSCULAR | Status: AC | PRN
  Administered 2022-07-02: 80 mL via INTRAVENOUS

## 2022-07-02 MED ORDER — KETOROLAC TROMETHAMINE 30 MG/ML IJ SOLN
30.0000 mg | Freq: Once | INTRAMUSCULAR | Status: AC
Start: 2022-07-02 — End: 2022-07-02
  Administered 2022-07-02: 30 mg via INTRAVENOUS
  Filled 2022-07-02: qty 1

## 2022-07-02 MED ORDER — ONDANSETRON 8 MG PO TBDP: 8.0000 mg | ORAL_TABLET | Freq: Three times a day (TID) | ORAL | 0 refills | Status: DC | PRN

## 2022-07-02 NOTE — ED Notes (Signed)
See triage note  Presents with RLQ pain  States pain became worse since discharge this am  Afebrile on arrival

## 2022-07-02 NOTE — ED Provider Notes (Signed)
[facility] Provider Note   Event Date/Time   First MD Initiated Contact with Patient 07/02/22 [phone]     (approximate) History  Abdominal Pain  HPI April Fleming is a 21 y.o. female  Location: Right lower quadrant abdomen Duration: Began this morning Timing: Stable since onset Severity: 6/10 Quality: Aching Context: Patient states beginning this morning she has had some right lower quadrant abdominal pain.  Patient states that she had multiple episodes of nausea/vomiting 2 days prior to arrival but none today Modifying factors: Denies any exacerbating or relieving factors Associated Symptoms: Denies ROS: Patient currently denies any vision changes, tinnitus, difficulty speaking, facial droop, sore throat, chest pain, shortness of breath,  nausea/vomiting/diarrhea, dysuria, or weakness/numbness/paresthesias in any extremity   Physical Exam  Triage Vital Signs: ED Triage Vitals  Enc Vitals Group     BP 07/02/22 0724 (!) 147/92     Pulse Rate 07/02/22 0724 77     Resp 07/02/22 0724 18     Temp 07/02/22 0724 97.9 F (36.6 C)     Temp Source 07/02/22 0724 Oral     SpO2 07/02/22 0724 100 %     Weight 07/02/22 0725 125 lb (56.7 kg)     Height 07/02/22 0725 '5\' 3"'$  (1.6 m)     Head Circumference --      Peak Flow --      Pain Score 07/02/22 0725 6     Pain Loc --      Pain Edu? --      Excl. in [place]? --    Most recent vital signs: Vitals:   07/02/22 0724  BP: (!) 147/92  Pulse: 77  Resp: 18  Temp: 97.9 F (36.6 C)  SpO2: 100%   General: Awake, oriented x4. CV:  Good peripheral perfusion.  Resp:  Normal effort.  Abd:  No distention.  Other:  Young adult Caucasian female sitting in chair in exam room in no acute distress ED Results / Procedures / Treatments  Labs (all labs ordered are listed, but only abnormal results are displayed) Labs Reviewed  COMPREHENSIVE METABOLIC PANEL - Abnormal; Notable for the following components:      Result  Value   Glucose, Bld 100 (*)    All other components within normal limits  URINALYSIS, ROUTINE W REFLEX MICROSCOPIC - Abnormal; Notable for the following components:   Color, Urine COLORLESS (*)    APPearance CLEAR (*)    Specific Gravity, Urine 1.001 (*)    All other components within normal limits  LIPASE, BLOOD  CBC  PREGNANCY, URINE  PROCEDURES: Critical Care performed: No Procedures MEDICATIONS ORDERED IN ED: Medications - No data to display IMPRESSION / MDM / [place] / ED COURSE  I reviewed the triage vital signs and the nursing notes.                             The patient is on the cardiac monitor to evaluate for evidence of arrhythmia and/or significant heart rate changes. Patient's presentation is most consistent with acute presentation with potential threat to life or bodily function. Patients symptoms not typical for emergent causes of abdominal pain such as, but not limited to, appendicitis, abdominal aortic aneurysm, surgical biliary disease, pancreatitis, SBO, mesenteric ischemia, serious intra-abdominal bacterial illness. Presentation also not typical of gynecologic emergencies such as TOA, Ovarian Torsion, PID. Not Ectopic. Doubt atypical ACS.  Pt tolerating PO. Disposition: Patient will  be discharged with strict return precautions and follow up with primary MD within 12-24 hours for further evaluation. Patient understands that this still may have an early presentation of an emergent medical condition such as appendicitis that will require a recheck.   FINAL CLINICAL IMPRESSION(S) / ED DIAGNOSES   Final diagnoses:  Right lower quadrant abdominal pain   Rx / DC Orders   ED Discharge Orders     None      Note:  This document was prepared using Dragon voice recognition software and may include unintentional dictation errors.   Naaman Plummer, MD 07/02/22 [phone]

## 2022-07-02 NOTE — ED Provider Notes (Signed)
[facility] Provider Note   Event Date/Time   First MD Initiated Contact with Patient 07/02/22 1333     (approximate) History  Abdominal Pain  HPI April Fleming is a 21 y.o. female patient presents for the second time today with similar symptoms in the first however worsening abdominal pain in the right lower quadrant region.  Patient also endorses new onset of nausea without any vomiting over the last few hours since discharge.  Patient states that she does have a history of PCOS and feels that this may be a ruptured ovarian cyst.  Patient denies any abnormal vaginal discharge, dysuria, flank pain, or vaginal bleeding   Physical Exam  Triage Vital Signs: ED Triage Vitals  Enc Vitals Group     BP 07/02/22 1248 (!) 189/84     Pulse Rate 07/02/22 1248 79     Resp 07/02/22 1248 18     Temp 07/02/22 1248 98.8 F (37.1 C)     Temp Source 07/02/22 1248 Oral     SpO2 07/02/22 1248 98 %     Weight 07/02/22 1342 125 lb (56.7 kg)     Height 07/02/22 1342 '5\' 3"'$  (1.6 m)     Head Circumference --      Peak Flow --      Pain Score 07/02/22 1248 8     Pain Loc --      Pain Edu? --      Excl. in [place]? --    Most recent vital signs: Vitals:   07/02/22 1410 07/02/22 1543  BP: (!) 180/80 (!) 178/78  Pulse: 80 78  Resp: 18 18  Temp:    SpO2: 98% 98%   General: Awake, oriented x4. CV:  Good peripheral perfusion.  Resp:  Normal effort.  Abd:  No distention.  Mild tenderness to palpation over right lower quadrant.  No abdominal rigidity.  No rebound tenderness Other:  Young adult Caucasian female ED Results / Procedures / Treatments  Labs (all labs ordered are listed, but only abnormal results are displayed) Labs Reviewed  CBC WITH DIFFERENTIAL/PLATELET  RADIOLOGY ED MD interpretation: CT of the abdomen and pelvis with IV contrast shows mesenteric lymphadenopathy as well as trace free fluid in the lower abdomen concerning for possible ruptured ovarian  cyst -Agree with radiology assessment Official radiology report(s): No results found. PROCEDURES: Critical Care performed: No Procedures MEDICATIONS ORDERED IN ED: Medications  iohexol (OMNIPAQUE) 300 MG/ML solution 80 mL (80 mLs Intravenous Contrast Given 07/02/22 1455)  ondansetron (ZOFRAN) injection 4 mg (4 mg Intravenous Given 07/02/22 1516)  ketorolac (TORADOL) 30 MG/ML injection 30 mg (30 mg Intravenous Given 07/02/22 1515)   IMPRESSION / MDM / ASSESSMENT AND PLAN / ED COURSE  I reviewed the triage vital signs and the nursing notes.                             The patient is on the cardiac monitor to evaluate for evidence of arrhythmia and/or significant heart rate changes. Patient's presentation is most consistent with acute presentation with potential threat to life or bodily function. Patients symptoms not typical for emergent causes of abdominal pain such as, but not limited to, appendicitis, abdominal aortic aneurysm, surgical biliary disease, pancreatitis, SBO, mesenteric ischemia, serious intra-abdominal bacterial illness. Presentation also not typical of gynecologic emergencies such as TOA, Ovarian Torsion, PID. Not Ectopic. Doubt atypical ACS. Given CT abdomen pelvis with mesenteric lymphadenopathy as well  as trace fluid fluid, concern for possible ruptured ovarian cyst.  Encouraged follow-up with OB/GYN for further evaluation and management Pt tolerating PO. Disposition: Patient will be discharged with strict return precautions and follow up with primary MD within 12-24 hours for further evaluation. Patient understands that this still may have an early presentation of an emergent medical condition such as appendicitis that will require a recheck.   FINAL CLINICAL IMPRESSION(S) / ED DIAGNOSES   Final diagnoses:  Ruptured ovarian cyst  Right lower quadrant abdominal pain   Rx / DC Orders   ED Discharge Orders          Ordered    HYDROcodone-acetaminophen (NORCO)  5-325 MG tablet  Every 4 hours PRN        07/02/22 1514    ondansetron (ZOFRAN-ODT) 8 MG disintegrating tablet  Every 8 hours PRN        07/02/22 1514           Note:  This document was prepared using Dragon voice recognition software and may include unintentional dictation errors.   Naaman Plummer, MD 07/06/22 1540

## 2022-07-02 NOTE — ED Triage Notes (Signed)
Patient to ED for abd pain and nausea. Patient seen here for same this AM and discharge but came back due to worsening pain.

## 2022-07-02 NOTE — Telephone Encounter (Signed)
    Chief Complaint: Seen in ED today with abdominal pain right lower quadrant. Requests to be seen today Symptoms: Above Frequency: Today Pertinent Negatives: Patient denies fever Disposition: '[]'$ ED /'[]'$ Urgent Care (no appt availability in office) / '[x]'$ Appointment(In office/virtual)/ '[]'$  [facility]/ '[]'$ Home Care/ '[]'$ Refused Recommended Disposition /'[]'$ [facility]/ '[]'$  Follow-up with PCP Additional Notes:   Reason for Disposition  [1] MODERATE pain (e.g., interferes with normal activities) AND [2] pain comes and goes (cramps) AND [3] present > 24 hours  (Exception: pain with Vomiting or Diarrhea - see that Guideline)  Answer Assessment - Initial Assessment Questions 1. LOCATION: "Where does it hurt?"      Right lower quadrant 2. RADIATION: "Does the pain shoot anywhere else?" (e.g., chest, back)     No 3. ONSET: "When did the pain begin?" (e.g., minutes, hours or days ago)      Today 4. SUDDEN: "Gradual or sudden onset?"     Sudden 5. PATTERN "Does the pain come and go, or is it constant?"    - If constant: "Is it getting better, staying the same, or worsening?"      (Note: Constant means the pain never goes away completely; most serious pain is constant and it progresses)     - If intermittent: "How long does it last?" "Do you have pain now?"     (Note: Intermittent means the pain goes away completely between bouts)     Constant 6. SEVERITY: "How bad is the pain?"  (e.g., Scale 1-10; mild, moderate, or severe)   - MILD (1-3): doesn't interfere with normal activities, abdomen soft and not tender to touch    - MODERATE (4-7): interferes with normal activities or awakens from sleep, abdomen tender to touch    - SEVERE (8-10): excruciating pain, doubled over, unable to do any normal activities      Now - 6-7 7. RECURRENT SYMPTOM: "Have you ever had this type of stomach pain before?" If Yes, ask: "When was the last time?" and "What happened that time?"      No 8. CAUSE:  "What do you think is causing the stomach pain?"     Unsure 9. RELIEVING/AGGRAVATING FACTORS: "What makes it better or worse?" (e.g., movement, antacids, bowel movement)     Movement 10. OTHER SYMPTOMS: "Do you have any other symptoms?" (e.g., back pain, diarrhea, fever, urination pain, vomiting)       Had vomiting Monday 11. PREGNANCY: "Is there any chance you are pregnant?" "When was your last menstrual period?"       No  Protocols used: Abdominal Pain - [facility]

## 2022-07-02 NOTE — ED Triage Notes (Signed)
Patient to ED via POV ambulatory to triage for RLQ pain that started this AM. Patient states she had multiple episodes of vomiting on Sunday but denies N/V today.

## 2022-07-02 NOTE — ED Notes (Signed)
See triage note  Presents with some right sided abd pain  States she developed some vomiting on Sunday  no fever  developed abd pain this am

## 2022-07-04 ENCOUNTER — Encounter: Payer: Self-pay | Admitting: Obstetrics

## 2022-07-04 ENCOUNTER — Inpatient Hospital Stay: Payer: Medicaid Other | Admitting: Family Medicine

## 2022-07-04 ENCOUNTER — Telehealth (INDEPENDENT_AMBULATORY_CARE_PROVIDER_SITE_OTHER): Payer: Medicaid Other | Admitting: Obstetrics

## 2022-07-04 DIAGNOSIS — N970 Female infertility associated with anovulation: Secondary | ICD-10-CM

## 2022-07-04 MED ORDER — LETROZOLE 2.5 MG PO TABS: 5.0000 mg | ORAL_TABLET | Freq: Every day | ORAL | 3 refills | Status: DC

## 2022-07-04 MED ORDER — MEDROXYPROGESTERONE ACETATE 10 MG PO TABS: 10.0000 mg | ORAL_TABLET | Freq: Every day | ORAL | 0 refills | Status: DC

## 2022-07-04 NOTE — Progress Notes (Signed)
Virtual Visit via Video Note  I connected with April Fleming on 07/04/22 at 10:45 AM EDT by a video enabled telemedicine application and verified that I am speaking with the correct person using two identifiers.  Location: Patient: Home Provider: Office   I discussed the limitations of evaluation and management by telemedicine and the availability of in person appointments. The patient expressed understanding and agreed to proceed.  History of Present Illness: April Fleming presents today for continuing management of PCOS and infertility. She has been taking letrozole 2.5 mg and inducing menstruation with Provera. She reports that based on her temperatures and OPKs, she is not likely ovulating. She did note a small amount of egg white discharge on CD12. Of note, she was seen recently in the ED for a ruptured ovarian cyst. That pain is now resolved, as is the pain on her left side.   Observations/Objective:   Assessment and Plan: Increase letrozole to 5 mg Continue to track cycles and time intercourse  Follow Up Instructions: If she is still not ovulating with 5 mg of letrozole, we can increase the dose to 7.5 Return with positive pregnancy test or continued anovulation   I discussed the assessment and treatment plan with the patient. The patient was provided an opportunity to ask questions and all were answered. The patient agreed with the plan and demonstrated an understanding of the instructions.   The patient was advised to call back or seek an in-person evaluation if the symptoms worsen or if the condition fails to improve as anticipated.  I provided 5 minutes of non-face-to-face time during this encounter.   Lurlean Horns, CNM

## 2022-08-12 ENCOUNTER — Other Ambulatory Visit: Payer: Self-pay | Admitting: Obstetrics

## 2022-08-12 ENCOUNTER — Telehealth: Payer: Self-pay | Admitting: Obstetrics

## 2022-08-12 DIAGNOSIS — Z3200 Encounter for pregnancy test, result unknown: Secondary | ICD-10-CM

## 2022-08-12 NOTE — Telephone Encounter (Signed)
Patient is calling requesting her HCG levels to be tested. Patient reports taking two pregnancy test with light lines. Please advise?

## 2022-08-12 NOTE — Telephone Encounter (Signed)
She can schedule a lab only visit. I'll put in the order. Thanks! Missy

## 2022-08-12 NOTE — Telephone Encounter (Signed)
I contacted patient via phone. Left message for patient to call back to be scheduled.

## 2022-08-13 ENCOUNTER — Other Ambulatory Visit: Payer: Medicaid Other

## 2022-08-13 DIAGNOSIS — Z3200 Encounter for pregnancy test, result unknown: Secondary | ICD-10-CM

## 2022-08-14 LAB — HUMAN CHORIONIC GONADOTROPIN(HCG),B-SUBUNIT,QUANTITATIVE): HCG, Beta Chain, Quant, S: <1 m[IU]/mL

## 2022-08-19 ENCOUNTER — Telehealth: Payer: Self-pay | Admitting: Obstetrics and Gynecology

## 2022-08-19 NOTE — Telephone Encounter (Signed)
Pt called stating that she needs Dr.Cherry to send her a blank message so she is able to add her to her mychart to message her. Pt states she saw her test results were negative and is asking for a refill of provera. Confirmed her pharmacy as [facility] in [place]. Please Advise.

## 2022-08-20 ENCOUNTER — Other Ambulatory Visit: Payer: Self-pay | Admitting: Obstetrics

## 2022-08-20 NOTE — Telephone Encounter (Signed)
Sent patient mychart message

## 2022-08-22 ENCOUNTER — Ambulatory Visit (INDEPENDENT_AMBULATORY_CARE_PROVIDER_SITE_OTHER): Payer: Medicaid Other | Admitting: Internal Medicine

## 2022-08-22 ENCOUNTER — Encounter: Payer: Self-pay | Admitting: Internal Medicine

## 2022-08-22 VITALS — BP 108/72 | HR 77 | Temp 96.5°F | Wt 126.0 lb

## 2022-08-22 DIAGNOSIS — M542 Cervicalgia: Secondary | ICD-10-CM | POA: Diagnosis not present

## 2022-08-22 DIAGNOSIS — G44209 Tension-type headache, unspecified, not intractable: Secondary | ICD-10-CM

## 2022-08-22 DIAGNOSIS — M26629 Arthralgia of temporomandibular joint, unspecified side: Secondary | ICD-10-CM

## 2022-08-22 NOTE — Patient Instructions (Signed)
Tension Headache, Adult A tension headache is a feeling of pain, pressure, or aching in the head. It is often felt over the front and sides of the head. Tension headaches can last from 30 minutes to several days. What are the causes? The cause of this condition is not known. Sometimes, tension headaches are brought on by stress, worry (anxiety), or depression. Other things that may set them off include: Alcohol. Too much caffeine or caffeine withdrawal. Colds, flu, or sinus infections. Dental problems. This can include clenching your teeth. Being tired. Holding your head and neck in the same position for a long time, such as while using a computer. Smoking. Arthritis in the neck. What are the signs or symptoms? Feeling pressure around the head. A dull ache in the head. Pain over the front and sides of the head. Feeling sore or tender in the muscles of the head, neck, and shoulders. How is this treated? This condition may be treated with lifestyle changes and with medicines that help relieve symptoms. Follow these instructions at home: Managing pain Take over-the-counter and prescription medicines only as told by your doctor. When you have a headache, lie down in a dark, quiet room. If told, put ice on your head and neck. To do this: Put ice in a plastic bag. Place a towel between your skin and the bag. Leave the ice on for 20 minutes, 2-3 times a day. Take off the ice if your skin turns bright red. This is very important. If you cannot feel pain, heat, or cold, you have a greater risk of damage to the area. If told, put heat on the back of your neck. Do this as often as told by your doctor. Use the heat source that your doctor recommends, such as a moist heat pack or a heating pad. Place a towel between your skin and the heat source. Leave the heat on for 20-30 minutes. Take off the heat if your skin turns bright red. This is very important. If you cannot feel pain, heat, or cold, you  have a greater risk of getting burned. Eating and drinking Eat meals on a regular schedule. If you drink alcohol: Limit how much you have to: 0-1 drink a day for women who are not pregnant. 0-2 drinks a day for men. Know how much alcohol is in your drink. In the [place] one drink equals one 12 oz bottle of beer (355 mL), one 5 oz glass of wine (148 mL), or one 1 oz glass of hard liquor (44 mL). Drink enough fluid to keep your pee (urine) pale yellow. Do not use a lot of caffeine, or stop using caffeine. Lifestyle Get 7-9 hours of sleep each night. Or get the amount of sleep that your doctor tells you to. At bedtime, keep computers, phones, and tablets out of your room. Find ways to lessen your stress. This may include: Exercise. Deep breathing. Yoga. Listening to music. Thinking positive thoughts. Sit up straight. Try to relax your muscles. Do not smoke or use any products that contain nicotine or tobacco. If you need help quitting, ask your doctor. General instructions  Avoid things that can bring on headaches. Keep a headache journal to see what may bring on headaches. For example, write down: What you eat and drink. How much sleep you get. Any change to your diet or medicines. Keep all follow-up visits. Contact a doctor if: Your headache does not get better. Your headache comes back. You have a headache, and sounds,   light, or smells bother you. You feel like you may vomit, or you vomit. Your stomach hurts. Get help right away if: You all of a sudden get a very bad headache with any of these things: A stiff neck. Feeling like you may vomit. Vomiting. Feeling mixed up (confused). Feeling weak in one part or one side of your body. Having trouble seeing or speaking, or both. Feeling short of breath. A rash. Feeling very sleepy. Pain in your eye or ear. Trouble walking or balancing. Feeling like you will faint, or you faint. Summary A tension headache is pain, pressure,  or aching in your head. Tension headaches can last from 30 minutes to several days. Lifestyle changes and medicines may help relieve pain. This information is not intended to replace advice given to you by your health care provider. Make sure you discuss any questions you have with your health care provider. Document Revised: 09/07/2020 Document Reviewed: 09/07/2020 Elsevier Patient Education  2023 Elsevier Inc.  

## 2022-08-22 NOTE — Progress Notes (Signed)
Subjective:    Patient ID: April Fleming, female    DOB: [birth year], 21 y.o.   MRN: [id number]  HPI  Patient presents to clinic today with complaint of headache. This started 1 week ago. She describes the pain as stiff, sore and achy. The pain radiates into the right side of her neck.  The pain can be worse with movement. She reports associated right ear intermittent pain and tingling.  She denies ear drainage or decreased. She denies dizziness, sensitivity to light or sound, facial pain or pressure, runny nose, nasal congestion, sore throat or cough. She has intermittent headaches and reports this feels similar.  She also has known TMJ.  She has tried Tylenol, Execdrin. She is prescribed Methocarbamol but has not taken this because she is trying to conceive. She has been under a lot of stress lately.  Review of Systems     Past Medical History:  Diagnosis Date   Abdominal pain    ASD (atrial septal defect)    Costochondritis    COVID-19 12/23/2020   Dysplastic nevus 11/08/2021   mild, R shoulder, recheck at followup   IBS (irritable bowel syndrome)    Irregular periods/menstrual cycles    Migraine headache without aura    Vomiting     Current Outpatient Medications  Medication Sig Dispense Refill   acetaminophen (TYLENOL) 500 MG tablet Take 500 mg by mouth every 6 (six) hours as needed.     aspirin-acetaminophen-caffeine (EXCEDRIN MIGRAINE) 250-250-65 MG tablet Take by mouth every 6 (six) hours as needed for headache.     Chaste Tree (VITEX EXTRACT PO) Take by mouth.     dicyclomine (BENTYL) 10 MG capsule Take 10 mg by mouth 3 (three) times daily as needed for spasms.     HYDROcodone-acetaminophen (NORCO) 5-325 MG tablet Take 1 tablet by mouth every 4 (four) hours as needed for moderate pain. 15 tablet 0   ibuprofen (ADVIL) 200 MG tablet Take 200 mg by mouth every 6 (six) hours as needed.     Inositol-Folic Acid (PREGNITUDE PO) Take by mouth.     letrozole (FEMARA) 2.5 MG  tablet TAKE 2 TABLETS BY MOUTH ONCE DAILY ON CYCLE DAYS 3-7 10 tablet 3   medroxyPROGESTERone (PROVERA) 10 MG tablet TAKE 1 TABLET BY MOUTH ONCE DAILY 10 tablet 0   methocarbamol (ROBAXIN) 500 MG tablet Take 1,000 mg by mouth 2 (two) times daily as needed. (Patient not taking: Reported on 01/31/2022)     Multiple Vitamin (MULTIVITAMIN) tablet Take 1 tablet by mouth daily.     Omega-3 Fatty Acids (FISH OIL ADULT GUMMIES PO) Take by mouth daily. (Patient not taking: Reported on 01/31/2022)     ondansetron (ZOFRAN-ODT) 8 MG disintegrating tablet Take 1 tablet (8 mg total) by mouth every 8 (eight) hours as needed for nausea or vomiting. 20 tablet 0   Peppermint Oil 90 MG CPCR Take 1 capsule by mouth 3 (three) times daily as needed.     No current facility-administered medications for this visit.    No Known Allergies  Family History  Problem Relation Age of Onset   Dementia Paternal Grandmother    Melanoma Maternal Grandmother    Hypertension Maternal Grandmother    Peptic Ulcer Maternal Grandmother    Healthy Maternal Grandfather    Intellectual disability Maternal Grandfather    Melanoma Father 33   COPD Mother    Hypertension Mother    Peptic Ulcer Mother    Healthy Other    Healthy  Other     Social History   Socioeconomic History   Marital status: Single    Spouse name: Not on file   Number of children: Not on file   Years of education: Not on file   Highest education level: High school graduate  Occupational History   Not on file  Tobacco Use   Smoking status: Never   Smokeless tobacco: Never  Vaping Use   Vaping Use: Former   Quit date: 05/12/2019   Substances: Nicotine, Flavoring   Devices: self-filled vaporizer  Substance and Sexual Activity   Alcohol use: Yes    Comment: occasionally   Drug use: Never   Sexual activity: Yes    Birth control/protection: None  Other Topics Concern   Not on file  Social History Narrative   Works at [employer]. Care   Social  Determinants of Health   Financial Resource Strain: Not on file  Food Insecurity: Not on file  Transportation Needs: Not on file  Physical Activity: Not on file  Stress: Not on file  Social Connections: Not on file  Intimate Partner Violence: Not on file     Constitutional: Patient reports headache.  Denies fever, malaise, fatigue, or abrupt weight changes.  HEENT: Patient reports right ear pain and tingling.  Denies eye pain, eye redness, ear pain, ringing in the ears, wax buildup, runny nose, nasal congestion, bloody nose, or sore throat. Respiratory: Denies difficulty breathing, shortness of breath, cough or sputum production.   Cardiovascular: Denies chest pain, chest tightness, palpitations or swelling in the hands or feet.  Gastrointestinal: Denies abdominal pain, bloating, constipation, diarrhea or blood in the stool.  Musculoskeletal: Patient reports right-sided neck pain.  Denies decrease in range of motion, difficulty with gait, or joint pain and swelling.  Skin: Denies redness, rashes, lesions or ulcercations.  Neurological: Denies dizziness, difficulty with memory, difficulty with speech or problems with balance and coordination.    No other specific complaints in a complete review of systems (except as listed in HPI above).  Objective:   Physical Exam BP 108/72 (BP Location: Left Arm, Patient Position: Sitting, Cuff Size: Normal)   Pulse 77   Temp (!) 96.5 F (35.8 C) (Temporal)   Wt 126 lb (57.2 kg)   SpO2 98%   BMI 22.32 kg/m   Wt Readings from Last 3 Encounters:  07/02/22 125 lb (56.7 kg)  07/02/22 125 lb (56.7 kg)  03/14/22 125 lb 6.4 oz (56.9 kg)    General: Appears her stated age, well developed, well nourished in NAD. Skin: Warm, dry and intact. No rashes noted. HEENT: Head: normal shape and size; Eyes: sclera white, no icterus, conjunctiva pink, PERRLA and EOMs intact; Ears: Tm's gray and intact, normal light reflex;  Cardiovascular: Normal rate and  rhythm. S1,S2 noted.  No murmur, rubs or gallops noted.  Pulmonary/Chest: Normal effort and positive vesicular breath sounds. No respiratory distress. No wheezes, rales or ronchi noted.  Musculoskeletal: Clicking noted with bilateral TMJs with opening and closing of the jaw.  Normal flexion, extension, rotation and lateral bending of the cervical spine.  No bony tenderness noted over the cervical spine.  Pain with palpation of the right paracervical muscles.  Shoulder shrug is equal.  No difficulty with gait.  Neurological: Alert and oriented. Coordination normal.    BMET    Component Value Date/Time   NA 139 07/02/2022 0727   K 3.9 07/02/2022 0727   CL 107 07/02/2022 0727   CO2 24 07/02/2022 0727  GLUCOSE 100 (H) 07/02/2022 0727   BUN 11 07/02/2022 0727   CREATININE 0.67 07/02/2022 0727   CREATININE 0.65 01/08/2022 0840   CALCIUM 9.6 07/02/2022 0727   GFRNONAA >60 07/02/2022 0727   GFRNONAA 129 05/26/2019 0925   GFRAA >60 07/03/2020 1119   GFRAA 150 05/26/2019 0925    Lipid Panel  No results found for: "CHOL", "TRIG", "HDL", "CHOLHDL", "VLDL", "LDLCALC"  CBC    Component Value Date/Time   WBC 8.5 07/02/2022 1410   RBC 4.34 07/02/2022 1410   HGB 13.2 07/02/2022 1410   HCT 38.7 07/02/2022 1410   PLT 235 07/02/2022 1410   MCV 89.2 07/02/2022 1410   MCH 30.4 07/02/2022 1410   MCHC 34.1 07/02/2022 1410   RDW 12.0 07/02/2022 1410   LYMPHSABS 2.0 07/02/2022 1410   MONOABS 0.6 07/02/2022 1410   EOSABS 0.1 07/02/2022 1410   BASOSABS 0.1 07/02/2022 1410    Hgb A1C Lab Results  Component Value Date   HGBA1C 5.1 01/31/2022            Assessment & Plan:   Headache, Right Side Neck Pain:  Seems muscular in origin versus tension headache Offered Toradol 30 mg IM but she declines Advised her to take her Methocarbamol 500 mg at bedtime for the next few nights Encouraged heat and massage Encourage neck exercises  Right Ear Pain, TMJ:  Advised her to follow-up with  her dentist regarding a dental device  Return precautions discussed  Return precautions discussed [name], NP

## 2022-08-24 ENCOUNTER — Ambulatory Visit (INDEPENDENT_AMBULATORY_CARE_PROVIDER_SITE_OTHER): Payer: Medicaid Other

## 2022-08-24 ENCOUNTER — Ambulatory Visit
Admission: EM | Admit: 2022-08-24 | Discharge: 2022-08-24 | Disposition: A | Discharge status: Home / Self Care | Payer: Medicaid Other | Attending: Nurse Practitioner | Admitting: Nurse Practitioner

## 2022-08-24 DIAGNOSIS — S6702XA Crushing injury of left thumb, initial encounter: Secondary | ICD-10-CM | POA: Diagnosis not present

## 2022-08-24 DIAGNOSIS — M79645 Pain in left finger(s): Secondary | ICD-10-CM | POA: Diagnosis not present

## 2022-08-24 DIAGNOSIS — S60112A Contusion of left thumb with damage to nail, initial encounter: Secondary | ICD-10-CM | POA: Diagnosis not present

## 2022-08-24 DIAGNOSIS — S6010XA Contusion of unspecified finger with damage to nail, initial encounter: Secondary | ICD-10-CM

## 2022-08-24 NOTE — ED Provider Notes (Signed)
[facility]    CSN: [id number] Arrival date & time: 08/24/22  1224      History   Chief Complaint Chief Complaint  Patient presents with   Finger Injury    HPI April Fleming is a 21 y.o. female.   The history is provided by the patient and a friend.   Presents with her fianc for complaints of left thumb pain after she slammed the thumb in a truck door earlier today.  Patient states that she has numbness and swelling of the left thumb.  She also states that the thumb has been bleeding since the injury occurred.  She denies fever, chills, radiation of pain, or decreased range of motion of the thumb.  She states she has not taken any medication for her symptoms.  Denies any previous injury to the thumb.  She currently has gel nail polish in place.   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   Left-sided chest wall pain 05/10/2021   Chronic pain syndrome 05/10/2021   COVID-19 01/05/2021   Cough 12/13/2020   Vasovagal syncope 09/01/2020   Anxiety 09/01/2020   Post concussion syndrome 07/05/2020   Heart murmur 07/05/2020   Intractable migraine without aura and without status migrainosus 06/17/2018   Temporomandibular joint disorder (TMJ) 06/17/2018   Abdominal pain    Vomiting     Past Surgical History:  Procedure Laterality Date   WISDOM TOOTH EXTRACTION      OB History     Gravida  0   Para  0   Term  0   Preterm  0   AB  0   Living  0      SAB  0   IAB  0   Ectopic  0   Multiple  0   Live Births  0            Home Medications    Prior to  Admission medications   Medication Sig Start Date End Date Taking? Authorizing Provider  acetaminophen (TYLENOL) 500 MG tablet Take 500 mg by mouth every 6 (six) hours as needed.    [provider]  aspirin-acetaminophen-caffeine (EXCEDRIN MIGRAINE) [phone] MG tablet Take by mouth every 6 (six) hours as needed for headache.    [provider]  ibuprofen (ADVIL) 200 MG tablet Take 200 mg by mouth every 6 (six) hours as needed.    [provider]  letrozole (FEMARA) 2.5 MG tablet TAKE 2 TABLETS BY MOUTH ONCE DAILY ON CYCLE DAYS 3-7 08/21/22   Lurlean Horns, CNM  medroxyPROGESTERone (PROVERA) 10 MG tablet TAKE 1 TABLET BY MOUTH ONCE DAILY 08/21/22   Lurlean Horns, CNM  methocarbamol (ROBAXIN) 500 MG tablet Take 1,000 mg by mouth 2 (two) times daily as needed. 07/12/21   [provider]  Multiple Vitamin (MULTIVITAMIN) tablet Take 1 tablet by mouth daily.    [provider]  ondansetron (ZOFRAN-ODT) 8 MG disintegrating tablet Take 1 tablet (8 mg total) by mouth every 8 (eight) hours as needed for nausea or vomiting.  07/02/22   Naaman Plummer, MD  Peppermint Oil 90 MG CPCR Take 1 capsule by mouth 3 (three) times daily as needed. 03/23/20   [name], Devonne Doughty, DO    Family History Family History  Problem Relation Age of Onset   Dementia Paternal Grandmother    Melanoma Maternal Grandmother    Hypertension Maternal Grandmother    Peptic Ulcer Maternal Grandmother    Healthy Maternal Grandfather    Intellectual disability Maternal Grandfather    Melanoma Father 56   COPD Mother    Hypertension Mother    Peptic Ulcer Mother    Healthy Other    Healthy Other     Social History Social History   Tobacco Use   Smoking status: Never   Smokeless tobacco: Never  Vaping Use   Vaping Use: Former   Quit date: 05/12/2019   Substances: Nicotine, Flavoring   Devices: self-filled vaporizer  Substance Use Topics   Alcohol use: Yes     Comment: occasionally   Drug use: Never     Allergies   Patient has no known allergies.   Review of Systems Review of Systems Per HPI  Physical Exam Triage Vital Signs ED Triage Vitals  Enc Vitals Group     BP 08/24/22 1431 121/67     Pulse Rate 08/24/22 1431 69     Resp 08/24/22 1431 14     Temp 08/24/22 1431 98.3 F (36.8 C)     Temp Source 08/24/22 1431 Oral     SpO2 08/24/22 1431 96 %     Weight --      Height --      Head Circumference --      Peak Flow --      Pain Score 08/24/22 1433 10     Pain Loc --      Pain Edu? --      Excl. in [place]? --    No data found.  Updated Vital Signs BP 121/67 (BP Location: Right Arm)   Pulse 69   Temp 98.3 F (36.8 C) (Oral)   Resp 14   SpO2 96%   Visual Acuity Right Eye Distance:   Left Eye Distance:   Bilateral Distance:    Right Eye Near:   Left Eye Near:    Bilateral Near:     Physical Exam Vitals and nursing note reviewed.  Constitutional:      Appearance: Normal appearance.  HENT:     Head: Normocephalic.  Cardiovascular:     Rate and Rhythm: Normal rate and regular rhythm.     Pulses: Normal pulses.     Heart sounds: Normal heart sounds.  Pulmonary:     Effort: Pulmonary effort is normal.     Breath sounds: Normal breath sounds.  Abdominal:     General: Bowel sounds are normal.     Palpations: Abdomen is soft.  Musculoskeletal:     Left hand: Swelling and tenderness present. Normal range of motion. Normal capillary refill. Normal pulse.     Comments: Subungual hematoma observed due to bleeding around the nail of the left thumb.  Unable to visualize the nailbed due to gel polish in place.  Patient declines removal of the polish or trephination at this time.  Skin:    General: Skin is warm and dry.  Neurological:     Mental Status: She is alert.  Psychiatric:        Mood and Affect: Mood normal.  Behavior: Behavior normal.      UC Treatments / Results  Labs (all labs ordered are listed,  but only abnormal results are displayed) Labs Reviewed - No data to display  EKG   Radiology DG Finger Thumb Left  Result Date: 08/24/2022 CLINICAL DATA:  Trauma, pain EXAM: LEFT THUMB 2+V COMPARISON:  None Available. FINDINGS: There is no evidence of fracture or dislocation. There is no evidence of arthropathy or other focal bone abnormality. Soft tissues are unremarkable. IMPRESSION: No fracture or dislocation is seen in the left thumb. Electronically Signed   By: Elmer Picker M.D.   On: 08/24/2022 14:52    Procedures Procedures (including critical care time)  Medications Ordered in UC Medications - No data to display  Initial Impression / Assessment and Plan / UC Course  I have reviewed the triage vital signs and the nursing notes.  Pertinent labs & imaging results that were available during my care of the patient were reviewed by me and considered in my medical decision making (see chart for details).  Patient presents for pain and swelling of the left thumb after she accidentally slammed the thumb in a truck door earlier today.  On exam, patient has swelling of the thumb.  There is bleeding noted around the nailbed.  Patient has gel polish in place, unable to visualize the nail itself.  Patient declines trephination at this time.  Symptoms are consistent with a subungual hematoma of the left thumb.  X-ray is negative for left thumb fracture or dislocation.  Discussion with patient regarding use of ice while symptoms persist.  Advised patient that she may consider getting the nail polish removed and then returning to this office if her symptoms worsen.  Patient is in agreement with this plan of care as she would like to see if her symptoms improve.  Patient verbalizes understanding.  All questions were answered. Final Clinical Impressions(s) / UC Diagnoses   Final diagnoses:  Crushing injury of left thumb, initial encounter  Subungual hematoma of digit of hand, initial encounter      Discharge Instructions      The x-ray is negative for fracture or dislocation. As discussed, consider having the gel polish removed and then returning to our office if symptoms worsen.  At that time, it is recommended to create a small hole in the nail to relieve pressure caused by the bleeding. May take over-the-counter Tylenol or ibuprofen as needed for pain or discomfort. Apply ice to the left thumb.  Apply for 20 minutes, remove for 1 hour, then repeat as much as possible for the next 24 to 48 hours. Gentle range of motion exercises to the left thumb to keep the joint mobile. Follow-up as discussed.     ED Prescriptions   None    PDMP not reviewed this encounter.   Tish Men, NP 08/24/22 [phone]

## 2022-08-24 NOTE — ED Triage Notes (Signed)
Pt reports pain and swelling in the left thumb after she injured with a car door around 6 am today.

## 2022-08-24 NOTE — Discharge Instructions (Signed)
The x-ray is negative for fracture or dislocation. As discussed, consider having the gel polish removed and then returning to our office if symptoms worsen.  At that time, it is recommended to create a small hole in the nail to relieve pressure caused by the bleeding. May take over-the-counter Tylenol or ibuprofen as needed for pain or discomfort. Apply ice to the left thumb.  Apply for 20 minutes, remove for 1 hour, then repeat as much as possible for the next 24 to 48 hours. Gentle range of motion exercises to the left thumb to keep the joint mobile. Follow-up as discussed.

## 2022-09-19 ENCOUNTER — Ambulatory Visit: Payer: Self-pay

## 2022-09-19 NOTE — Telephone Encounter (Signed)
  Chief Complaint: nasal congestion Symptoms: nasal congestion, sinus pressure, sore throat, cough Frequency: since Tuesday Pertinent Negatives: Patient denies SOB or chest pain Disposition: '[]'$ ED /'[]'$ Urgent Care (no appt availability in office) / '[]'$ Appointment(In office/virtual)/ '[]'$  [place] [place] [facility]/ '[x]'$ Home Care/ '[]'$ Refused Recommended Disposition /'[]'$ [facility]/ '[]'$  Follow-up with PCP Additional Notes: pt states she started Sudafed 2 days ago and wanted to know what else she could try OTC. Gave pt some care advice and recommended if sx no better by early next week to call back to schedule appt. Pt verbalized understanding.   Summary: nasal drainage and diarrhea   pt called in for assistance. Pt says that she can feel pressure in her face. Pt says that she is experiencing a sore throat, nasal drainage and diarrhea. Pt would like to know if provider or nurse could suggest something that she could take OTC?      Reason for Disposition  Common cold with no complications  Answer Assessment - Initial Assessment Questions 1. ONSET: "When did the nasal discharge start?"      Tues 2. AMOUNT: "How much discharge is there?"      Mild to moderate  3. COUGH: "Do you have a cough?" If Yes, ask: "Describe the color of your sputum" (clear, white, yellow, green)     Yes but clear  4. RESPIRATORY DISTRESS: "Describe your breathing."      no 5. FEVER: "Do you have a fever?" If Yes, ask: "What is your temperature, how was it measured, and when did it start?"     no 7. OTHER SYMPTOMS: "Do you have any other symptoms?" (e.g., sore throat, earache, wheezing, vomiting)     Sore throat, congestion, sinus pressure, nasal drainage  Protocols used: Common Cold-A-AH

## 2022-09-22 ENCOUNTER — Ambulatory Visit
Admission: EM | Admit: 2022-09-22 | Discharge: 2022-09-22 | Disposition: A | Discharge status: Home / Self Care | Payer: Medicaid Other

## 2022-09-22 DIAGNOSIS — J019 Acute sinusitis, unspecified: Secondary | ICD-10-CM

## 2022-09-22 MED ORDER — METHYLPREDNISOLONE 4 MG PO TBPK: ORAL_TABLET | ORAL | 0 refills | Status: DC

## 2022-09-22 NOTE — ED Triage Notes (Signed)
Pt states she started having sinus pressure two days and go and presents today w/ a cough and production of yellow and green nasal drainage. Pt. Has been taking OTC medication.

## 2022-09-22 NOTE — ED Provider Notes (Signed)
[facility]    CSN: [id number] Arrival date & time: 09/22/22  4081      History   Chief Complaint Chief Complaint  Patient presents with   Nasal Congestion   Cough    HPI April Fleming is a 21 y.o. female.    Cough   Patient presents to urgent care with complaint of sinus pressure x4 days.  Nasal congestion is productive of green/yellow drainage.  Associated symptoms of productive cough.  She reports that symptoms initiated with crusty eyes, now resolved.  States that her head feels like it is going to "pop off".  Reports using OTC medication for symptom control.  Using Advil sinus without improvement in symptoms.  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   Left-sided chest wall pain 05/10/2021   Chronic pain syndrome 05/10/2021   COVID-19 01/05/2021   Cough 12/13/2020   Vasovagal syncope 09/01/2020   Anxiety 09/01/2020   Post concussion syndrome 07/05/2020   Heart murmur 07/05/2020   Intractable migraine without aura and without status migrainosus 06/17/2018   Temporomandibular joint disorder (TMJ) 06/17/2018   Abdominal pain    Vomiting     Past Surgical History:  Procedure Laterality Date   WISDOM TOOTH EXTRACTION      OB History     Gravida  0   Para  0   Term  0   Preterm  0   AB  0   Living  0      SAB  0   IAB  0   Ectopic  0   Multiple  0   Live Births  0            Home Medications    Prior to Admission medications   Medication Sig Start Date End Date Taking? Authorizing Provider  acetaminophen (TYLENOL) 500 MG tablet  Take 500 mg by mouth every 6 (six) hours as needed.    [provider]  aspirin-acetaminophen-caffeine (EXCEDRIN MIGRAINE) [phone] MG tablet Take by mouth every 6 (six) hours as needed for headache.    [provider]  ibuprofen (ADVIL) 200 MG tablet Take 200 mg by mouth every 6 (six) hours as needed.    [provider]  letrozole (FEMARA) 2.5 MG tablet TAKE 2 TABLETS BY MOUTH ONCE DAILY ON CYCLE DAYS 3-7 08/21/22   Lurlean Horns, CNM  medroxyPROGESTERone (PROVERA) 10 MG tablet TAKE 1 TABLET BY MOUTH ONCE DAILY 08/21/22   Lurlean Horns, CNM  methocarbamol (ROBAXIN) 500 MG tablet Take 1,000 mg by mouth 2 (two) times daily as needed. 07/12/21   [provider]  Multiple Vitamin (MULTIVITAMIN) tablet Take 1 tablet by mouth daily.    [provider]  ondansetron (ZOFRAN-ODT) 8 MG disintegrating tablet Take 1 tablet (8 mg total) by mouth every 8 (eight) hours as needed for nausea or vomiting. 07/02/22   Naaman Plummer, MD  Peppermint Oil 90 MG CPCR Take 1 capsule by mouth 3 (three) times daily as needed. 03/23/20   Olin Hauser, DO  Family History Family History  Problem Relation Age of Onset   Dementia Paternal Grandmother    Melanoma Maternal Grandmother    Hypertension Maternal Grandmother    Peptic Ulcer Maternal Grandmother    Healthy Maternal Grandfather    Intellectual disability Maternal Grandfather    Melanoma Father 50   COPD Mother    Hypertension Mother    Peptic Ulcer Mother    Healthy Other    Healthy Other     Social History Social History   Tobacco Use   Smoking status: Never   Smokeless tobacco: Never  Vaping Use   Vaping Use: Former   Quit date: 05/12/2019   Substances: Nicotine, Flavoring   Devices: self-filled vaporizer  Substance Use Topics   Alcohol use: Yes    Comment: occasionally   Drug use: Never     Allergies   Patient has no known allergies.   Review of Systems Review of Systems   Respiratory:  Positive for cough.      Physical Exam Triage Vital Signs ED Triage Vitals  Enc Vitals Group     BP 09/22/22 0838 123/82     Pulse Rate 09/22/22 0838 89     Resp 09/22/22 0838 17     Temp 09/22/22 0838 98.7 F (37.1 C)     Temp src --      SpO2 09/22/22 0838 98 %     Weight --      Height --      Head Circumference --      Peak Flow --      Pain Score 09/22/22 0840 6     Pain Loc --      Pain Edu? --      Excl. in [place]? --    No data found.  Updated Vital Signs BP 123/82   Pulse 89   Temp 98.7 F (37.1 C)   Resp 17   SpO2 98%   Visual Acuity Right Eye Distance:   Left Eye Distance:   Bilateral Distance:    Right Eye Near:   Left Eye Near:    Bilateral Near:     Physical Exam Vitals reviewed.  Constitutional:      Appearance: Normal appearance.  HENT:     Nose: Congestion present.     Mouth/Throat:     Mouth: Mucous membranes are moist.     Pharynx: Posterior oropharyngeal erythema present. No oropharyngeal exudate.  Eyes:     Conjunctiva/sclera: Conjunctivae normal.  Cardiovascular:     Rate and Rhythm: Normal rate and regular rhythm.     Pulses: Normal pulses.     Heart sounds: Normal heart sounds.  Pulmonary:     Effort: Pulmonary effort is normal.     Breath sounds: Normal breath sounds.  Skin:    General: Skin is warm and dry.  Neurological:     General: No focal deficit present.     Mental Status: She is alert and oriented to person, place, and time.  Psychiatric:        Mood and Affect: Mood normal.        Behavior: Behavior normal.      UC Treatments / Results  Labs (all labs ordered are listed, but only abnormal results are displayed) Labs Reviewed - No data to display  EKG   Radiology No results found.  Procedures Procedures (including critical care time)  Medications Ordered in UC Medications - No data to display  Initial Impression / Assessment and  Plan / UC Course  I have reviewed the triage vital  signs and the nursing notes.  Pertinent labs & imaging results that were available during my care of the patient were reviewed by me and considered in my medical decision making (see chart for details).   Suspect viral etiology for her symptoms.  Respiratory swab was obtained and pending.  Antibiotic will not be effective.  Given the severity of her reported sinus symptoms, will prescribe corticosteroid taper.  She reports history of ASD.  Reviewed side effect profile for Medroprednisolone and informed her she could stop the medication if she develops concerning side effects, or when symptoms are resolved.   Final Clinical Impressions(s) / UC Diagnoses   Final diagnoses:  None   Discharge Instructions   None    ED Prescriptions   None    PDMP not reviewed this encounter.   Rose Phi, [place] 09/22/22 [phone]

## 2022-09-22 NOTE — Discharge Instructions (Addendum)
Follow up here or with your primary care provider if your symptoms are worsening or not improving with treatment.     

## 2022-10-05 ENCOUNTER — Other Ambulatory Visit: Payer: Self-pay | Admitting: Obstetrics

## 2022-10-23 ENCOUNTER — Ambulatory Visit (INDEPENDENT_AMBULATORY_CARE_PROVIDER_SITE_OTHER): Payer: 59 | Admitting: Obstetrics

## 2022-10-23 DIAGNOSIS — N970 Female infertility associated with anovulation: Secondary | ICD-10-CM | POA: Diagnosis not present

## 2022-10-23 MED ORDER — MEDROXYPROGESTERONE ACETATE 10 MG PO TABS: 10.0000 mg | ORAL_TABLET | Freq: Every day | ORAL | 0 refills | Status: DC

## 2022-10-23 NOTE — Progress Notes (Signed)
Virtual Visit via Telephone Note  I connected with April Fleming on 10/23/22 at 10:55 AM EDT by telephone and verified that I am speaking with the correct person using two identifiers.  Location: Patient: home Provider: office   I discussed the limitations, risks, security and privacy concerns of performing an evaluation and management service by telephone and the availability of in person appointments. I also discussed with the patient that there may be a patient responsible charge related to this service. The patient expressed understanding and agreed to proceed.   History of Present Illness: April Fleming is a 21 y.o. G0P0000 who would like to conceive. She has h/o of PCOS and anovulatory cycles. She has completed 3 rounds of letrozole (2.5 mg and 5 mg) and is not ovulating.    Observations/Objective: After tracking her cycles and signs of fertility, it appears unlikely that she is ovulating. She has not been using the OPK d/t work schedule. She did have a period without Provera on 09/28/22.  Assessment and Plan: 1) Anovulatory infertility  -[name] desires a referral to an infertility specialist. She would like to try a round of letrozole at 7.5 mg if she is unable to get an appointment in the next month Referral [facility] to [facility]. Rx for Provera sent to pharmacy if needed. She has enough letrozole at home to complete a round at 7.5 mg.  Follow Up Instructions:    I discussed the assessment and treatment plan with the patient. The patient was provided an opportunity to ask questions and all were answered. The patient agreed with the plan and demonstrated an understanding of the instructions.   The patient was advised to call back or seek an in-person evaluation if the symptoms worsen or if the condition fails to improve as anticipated.  I provided 8 minutes of non-face-to-face time during this encounter.   Lurlean Horns, CNM

## 2022-11-07 ENCOUNTER — Ambulatory Visit: Payer: 59 | Admitting: Dermatology

## 2022-11-07 ENCOUNTER — Encounter: Payer: Self-pay | Admitting: Dermatology

## 2022-11-07 DIAGNOSIS — Z808 Family history of malignant neoplasm of other organs or systems: Secondary | ICD-10-CM

## 2022-11-07 DIAGNOSIS — Z86018 Personal history of other benign neoplasm: Secondary | ICD-10-CM

## 2022-11-07 DIAGNOSIS — Z1283 Encounter for screening for malignant neoplasm of skin: Secondary | ICD-10-CM

## 2022-11-07 DIAGNOSIS — D229 Melanocytic nevi, unspecified: Secondary | ICD-10-CM

## 2022-11-07 DIAGNOSIS — D235 Other benign neoplasm of skin of trunk: Secondary | ICD-10-CM

## 2022-11-07 DIAGNOSIS — L578 Other skin changes due to chronic exposure to nonionizing radiation: Secondary | ICD-10-CM

## 2022-11-07 DIAGNOSIS — L814 Other melanin hyperpigmentation: Secondary | ICD-10-CM

## 2022-11-07 NOTE — Progress Notes (Signed)
   Follow-Up Visit   Subjective  April Fleming is a 21 y.o. female who presents for the following: Annual Exam (Hx of dysplastic nevus. Family Hx of MM, father).  The patient presents for Total-Body Skin Exam (TBSE) for skin cancer screening and mole check.  The patient has spots, moles and lesions to be evaluated, some may be new or changing and the patient has concerns that these could be cancer.  The following portions of the chart were reviewed this encounter and updated as appropriate:  Tobacco  Allergies  Meds  Problems  Med Hx  Surg Hx  Fam Hx      Review of Systems: No other skin or systemic complaints except as noted in HPI or Assessment and Plan.   Objective  Well appearing patient in no apparent distress; mood and affect are within normal limits.  A full examination was performed including scalp, head, eyes, ears, nose, lips, neck, chest, axillae, abdomen, back, buttocks, bilateral upper extremities, bilateral lower extremities, hands, feet, fingers, toes, fingernails, and toenails. All findings within normal limits unless otherwise noted below.  Right Flank Brown macule without features suspicious for malignancy on dermoscopy    Assessment & Plan   Family history of skin cancer - what type(s): Melanoma - who affected: Father  History of Dysplastic Nevus. Right shoulder, mild atypia. 11/08/2021. - No evidence of recurrence today - Recommend regular full body skin exams - Recommend daily broad spectrum sunscreen SPF 30+ to sun-exposed areas, reapply every 2 hours as needed.  - Call if any new or changing lesions are noted between office visits   Lentigines - Scattered tan macules - Due to sun exposure - Benign-appearing, observe - Recommend daily broad spectrum sunscreen SPF 30+ to sun-exposed areas, reapply every 2 hours as needed. - Call for any changes   Melanocytic Nevi - Tan-brown and/or pink-flesh-colored symmetric macules and papules - Benign  appearing on exam today - Observation - Call clinic for new or changing moles - Recommend daily use of broad spectrum spf 30+ sunscreen to sun-exposed areas.  - Check nails when remove polish.  Hemangiomas - Red papules - Discussed benign nature - Observe - Call for any changes  Actinic Damage - Chronic condition, secondary to cumulative UV/sun exposure - diffuse scaly erythematous macules with underlying dyspigmentation - Recommend daily broad spectrum sunscreen SPF 30+ to sun-exposed areas, reapply every 2 hours as needed.  - Staying in the shade or wearing long sleeves, sun glasses (UVA+UVB protection) and wide brim hats (4-inch brim around the entire circumference of the hat) are also recommended for sun protection.  - Call for new or changing lesions.  Nevus Spilus. Mid back. Owens Shark macules or papules within lighter tan patch - Genetic - Benign, observe - Call for any changes   Skin cancer screening performed today.  Nevus Right Flank  Benign-appearing.  Observation.  Call clinic for new or changing lesions.  Recommend daily use of broad spectrum spf 30+ sunscreen to sun-exposed areas.     Return in about 1 year (around 11/08/2023) for TBSE, HxDN.  I, Emelia Salisbury, CMA, am acting as scribe for Forest Gleason, MD.  Documentation: I have reviewed the above documentation for accuracy and completeness, and I agree with the above.  Forest Gleason, MD

## 2022-11-07 NOTE — Patient Instructions (Addendum)
Recommend taking Heliocare sun protection supplement daily in sunny weather for additional sun protection. For maximum protection on the sunniest days, you can take up to 2 capsules of regular Heliocare OR take 1 capsule of Heliocare Ultra. For prolonged exposure (such as a full day in the sun), you can repeat your dose of the supplement 4 hours after your first dose. Heliocare can be purchased at [employer], at some [facility] or at [email or web address].    Recommend taking Vitamin D 600iu daily.   Recommend daily broad spectrum sunscreen SPF 30+ to sun-exposed areas, reapply every 2 hours as needed. Call for new or changing lesions.  Staying in the shade or wearing long sleeves, sun glasses (UVA+UVB protection) and wide brim hats (4-inch brim around the entire circumference of the hat) are also recommended for sun protection.    Melanoma ABCDEs  Melanoma is the most dangerous type of skin cancer, and is the leading cause of death from skin disease.  You are more likely to develop melanoma if you: Have light-colored skin, light-colored eyes, or red or blond hair Spend a lot of time in the sun Tan regularly, either outdoors or in a tanning bed Have had blistering sunburns, especially during childhood Have a close family member who has had a melanoma Have atypical moles or large birthmarks  Early detection of melanoma is key since treatment is typically straightforward and cure rates are extremely high if we catch it early.   The first sign of melanoma is often a change in a mole or a new dark spot.  The ABCDE system is a way of remembering the signs of melanoma.  A for asymmetry:  The two halves do not match. B for border:  The edges of the growth are irregular. C for color:  A mixture of colors are present instead of an even brown color. D for diameter:  Melanomas are usually (but not always) greater than 59m - the size of a pencil eraser. E for evolution:  The spot keeps changing  in size, shape, and color.  Please check your skin once per month between visits. You can use a small mirror in front and a large mirror behind you to keep an eye on the back side or your body.   If you see any new or changing lesions before your next follow-up, please call to schedule a visit.  Please continue daily skin protection including broad spectrum sunscreen SPF 30+ to sun-exposed areas, reapplying every 2 hours as needed when you're outdoors.   Staying in the shade or wearing long sleeves, sun glasses (UVA+UVB protection) and wide brim hats (4-inch brim around the entire circumference of the hat) are also recommended for sun protection.    Due to recent changes in healthcare laws, you may see results of your pathology and/or laboratory studies on MyChart before the doctors have had a chance to review them. We understand that in some cases there may be results that are confusing or concerning to you. Please understand that not all results are received at the same time and often the doctors may need to interpret multiple results in order to provide you with the best plan of care or course of treatment. Therefore, we ask that you please give u[place]2 business days to thoroughly review all your results before contacting the office for clarification. Should we see a critical lab result, you will be contacted sooner.   If You Need Anything After Your Visit  If you have  any questions or concerns for your doctor, please call our main line at [phone] and press option 4 to reach your doctor's medical assistant. If no one answers, please leave a voicemail as directed and we will return your call as soon as possible. Messages left after 4 pm will be answered the following business day.   You may also send [place] a message via [place]. We typically respond to MyChart messages within 1-2 business days.  For prescription refills, please ask your pharmacy to contact our office. Our fax number is  [phone].  If you have an urgent issue when the clinic is closed that cannot wait until the next business day, you can page your doctor at the number below.    Please note that while we do our best to be available for urgent issues outside of office hours, we are not available 24/7.   If you have an urgent issue and are unable to reach [place], you may choose to seek medical care at your doctor's office, retail clinic, urgent care center, or emergency room.  If you have a medical emergency, please immediately call 911 or go to the emergency department.  Pager Numbers  - Dr. Nehemiah Massed: [phone]  - Dr. Laurence Ferrari: [phone]  - Dr. Nicole Kindred: [phone]  In the event of inclement weather, please call our main line at [phone] for an update on the status of any delays or closures.  Dermatology Medication Tips: Please keep the boxes that topical medications come in in order to help keep track of the instructions about where and how to use these. Pharmacies typically print the medication instructions only on the boxes and not directly on the medication tubes.   If your medication is too expensive, please contact our office at [phone] option 4 or send [place] a message through [place].   We are unable to tell what your co-pay for medications will be in advance as this is different depending on your insurance coverage. However, we may be able to find a substitute medication at lower cost or fill out paperwork to get insurance to cover a needed medication.   If a prior authorization is required to get your medication covered by your insurance company, please allow [place] 1-2 business days to complete this process.  Drug prices often vary depending on where the prescription is filled and some pharmacies may offer cheaper prices.  The website www.goodrx.com contains coupons for medications through different pharmacies. The prices here do not account for what the cost may be with help from  insurance (it may be cheaper with your insurance), but the website can give you the price if you did not use any insurance.  - You can print the associated coupon and take it with your prescription to the pharmacy.  - You may also stop by our office during regular business hours and pick up a GoodRx coupon card.  - If you need your prescription sent electronically to a different pharmacy, notify our office through [facility] or by phone at [phone] option 4.     Si Usted Necesita Algo Despus de Su Visita  Tambin puede enviarnos un mensaje a travs de Pharmacist, community. Por lo general respondemos a los mensajes de MyChart en el transcurso de 1 a 2 das hbiles.  Para renovar recetas, por favor pida a su farmacia que se ponga en contacto con nuestra oficina. Harland Dingwall de fax es [place] [phone].  Si tiene un asunto urgente cuando la clnica est cerrada y que  no puede esperar hasta el siguiente da hbil, puede llamar/localizar a su doctor(a) al nmero que aparece a continuacin.   Por favor, tenga en cuenta que aunque hacemos todo lo posible para estar disponibles para asuntos urgentes fuera del horario de [place], no estamos disponibles las 24 horas del da, los 7 das de la [place].   Si tiene un problema urgente y no puede comunicarse con nosotros, puede optar por buscar atencin mdica  en el consultorio de su doctor(a), en una clnica privada, en un centro de atencin urgente o en una sala de emergencias.  Si tiene Engineering geologist, por favor llame inmediatamente al 911 o vaya a la sala de emergencias.  Nmeros de bper  - Dr. Nehemiah Massed: [phone]  - Dra. Moye: [phone]  - Dra. Nicole Kindred: [phone]  En caso de inclemencias del [place], por favor llame a Johnsie Kindred principal al [phone] para una actualizacin sobre el [place] de cualquier retraso o cierre.  Consejos para la medicacin en dermatologa: Por favor, guarde las cajas en las que vienen los  medicamentos de uso tpico para ayudarle a seguir las instrucciones sobre dnde y cmo usarlos. Las farmacias generalmente imprimen las instrucciones del medicamento slo en las cajas y no directamente en los tubos del [place].   Si su medicamento es muy caro, por favor, pngase en contacto con Zigmund Daniel llamando al [phone] y presione la opcin 4 o envenos un mensaje a travs de Pharmacist, community.   No podemos decirle cul ser su copago por los medicamentos por adelantado ya que esto es diferente dependiendo de la cobertura de su seguro. Sin embargo, es posible que podamos encontrar un medicamento sustituto a Electrical engineer un formulario para que el seguro cubra el medicamento que se considera necesario.   Si se requiere una autorizacin previa para que su compaa de seguros [place] su medicamento, por favor permtanos de 1 a 2 das hbiles para completar este proceso.  Los precios de los medicamentos varan con frecuencia dependiendo del Environmental consultant de dnde se surte la receta y alguna farmacias pueden ofrecer precios ms baratos.  El sitio web www.goodrx.com tiene cupones para medicamentos de Airline pilot. Los precios aqu no tienen en cuenta lo que podra costar con la ayuda del seguro (puede ser ms barato con su seguro), pero el sitio web puede darle el precio si no utiliz Research scientist (physical sciences).  - Puede imprimir el cupn correspondiente y llevarlo con su receta a la farmacia.  - Tambin puede pasar por nuestra oficina durante el horario de atencin regular y Charity fundraiser una tarjeta de cupones de GoodRx.  - Si necesita que su receta se enve electrnicamente a una farmacia diferente, informe a nuestra oficina a travs de MyChart de [place] o por telfono llamando al [phone] y presione la opcin 4.

## 2022-11-16 ENCOUNTER — Other Ambulatory Visit: Payer: Self-pay

## 2022-11-16 ENCOUNTER — Encounter: Payer: Self-pay | Admitting: Emergency Medicine

## 2022-11-16 ENCOUNTER — Emergency Department
Admission: EM | Admit: 2022-11-16 | Discharge: 2022-11-16 | Disposition: A | Discharge status: Home / Self Care | Payer: 59 | Attending: Emergency Medicine | Admitting: Emergency Medicine

## 2022-11-16 DIAGNOSIS — Z8616 Personal history of COVID-19: Secondary | ICD-10-CM | POA: Insufficient documentation

## 2022-11-16 DIAGNOSIS — R0981 Nasal congestion: Secondary | ICD-10-CM | POA: Diagnosis not present

## 2022-11-16 DIAGNOSIS — H73891 Other specified disorders of tympanic membrane, right ear: Secondary | ICD-10-CM | POA: Diagnosis not present

## 2022-11-16 DIAGNOSIS — R55 Syncope and collapse: Secondary | ICD-10-CM | POA: Diagnosis not present

## 2022-11-16 DIAGNOSIS — H9201 Otalgia, right ear: Secondary | ICD-10-CM | POA: Diagnosis present

## 2022-11-16 DIAGNOSIS — Z20822 Contact with and (suspected) exposure to covid-19: Secondary | ICD-10-CM | POA: Diagnosis not present

## 2022-11-16 DIAGNOSIS — H6591 Unspecified nonsuppurative otitis media, right ear: Secondary | ICD-10-CM

## 2022-11-16 LAB — BASIC METABOLIC PANEL
Anion gap: 8 (ref 5–15)
BUN: 11 mg/dL (ref 6–20)
CO2: 25 mmol/L (ref 22–32)
Calcium: 9.6 mg/dL (ref 8.9–10.3)
Chloride: 106 mmol/L (ref 98–111)
Creatinine, Ser: 0.81 mg/dL (ref 0.44–1.00)
GFR, Estimated: >60 mL/min (ref >60)
Glucose, Bld: 95 mg/dL (ref 70–99)
Potassium: 3.8 mmol/L (ref 3.5–5.1)
Sodium: 139 mmol/L (ref 135–145)

## 2022-11-16 LAB — URINALYSIS, ROUTINE W REFLEX MICROSCOPIC
Bacteria, UA: NONE SEEN
Bilirubin Urine: NEGATIVE
Glucose, UA: NEGATIVE mg/dL
Ketones, ur: NEGATIVE mg/dL
Leukocytes,Ua: NEGATIVE
Nitrite: NEGATIVE
Protein, ur: NEGATIVE mg/dL
Specific Gravity, Urine: 1.01 (ref 1.005–1.030)
pH: 6 (ref 5.0–8.0)

## 2022-11-16 LAB — RESP PANEL BY RT-PCR (RSV, FLU A&B, COVID)  RVPGX2
Influenza A by PCR: NEGATIVE
Influenza B by PCR: NEGATIVE
Resp Syncytial Virus by PCR: NEGATIVE
SARS Coronavirus 2 by RT PCR: NEGATIVE

## 2022-11-16 LAB — CBC
HCT: 39.7 % (ref 36.0–46.0)
Hemoglobin: 13.6 g/dL (ref 12.0–15.0)
MCH: 29.7 pg (ref 26.0–34.0)
MCHC: 34.3 g/dL (ref 30.0–36.0)
MCV: 86.7 fL (ref 80.0–100.0)
Platelets: 232 10*3/uL (ref 150–400)
RBC: 4.58 MIL/uL (ref 3.87–5.11)
RDW: 11.9 % (ref 11.5–15.5)
WBC: 8.6 10*3/uL (ref 4.0–10.5)
nRBC: 0 % (ref 0.0–0.2)

## 2022-11-16 LAB — GROUP A STREP BY PCR: Group A Strep by PCR: NOT DETECTED

## 2022-11-16 LAB — POC URINE PREG, ED: Preg Test, Ur: NEGATIVE

## 2022-11-16 MED ORDER — FLUTICASONE PROPIONATE 50 MCG/ACT NA SUSP: 1.0000 | Freq: Every day | NASAL | 0 refills | Status: DC

## 2022-11-16 NOTE — Discharge Instructions (Signed)
Use the nasal spray to help drain the fluid behind your ear.  Your blood tests, urine test, and swabs were normal.  Please return for any new, worsening, or change in symptoms or other concerns.

## 2022-11-16 NOTE — ED Triage Notes (Signed)
Patient c/o feeling light headed, dizzy and like she is going to pass out onset of last week. Tingling to right side of face after changing her nose piercing. C/o increased urination, sore throat x 3 days and feeling congested.

## 2022-11-16 NOTE — ED Provider Triage Note (Signed)
Emergency Medicine Provider Triage Evaluation Note  April Fleming , a 21 y.o. female  was evaluated in triage.  Pt complains of feeling faint.  Patient also states that the right side of her face feels "tingly".  Patient states that this happened shortly after she changed the nose ring on the right side.  She denies any recent illnesses, history of anemia, fever or chills  Review of Systems  Positive: Feels faint, right-sided tingling. Negative: No visual changes, nausea, vomiting.  Physical Exam  BP 136/81 (BP Location: Left Arm)   Pulse 75   Temp 98.1 F (36.7 C) (Oral)   Resp 17   Ht '5\' 3"'$  (1.6 m)   Wt 54.9 kg   SpO2 100%   BMI 21.43 kg/m  Gen:   Awake, no distress, normal speech. Resp:  Normal effort, lungs are clear bilaterally. MSK:   Moves extremities without difficulty  Other:    Medical Decision Making  Medically screening exam initiated at 2:27 PM.  Appropriate orders placed.  April Fleming was informed that the remainder of the evaluation will be completed by another provider, this initial triage assessment does not replace that evaluation, and the importance of remaining in the ED until their evaluation is complete.     Johnn Hai, PA-C 11/16/22 1429

## 2022-11-16 NOTE — ED Provider Notes (Signed)
[facility] Provider Note    Event Date/Time   First MD Initiated Contact with Patient 11/16/22 1639     (approximate)   History   Near Syncope   HPI  April Fleming is a 21 y.o. female with a past medical history of vasovagal syncope, anxiety who presents today for evaluation of multiple complaints.  Patient reports that for the past 2 days she has felt intermittently lightheaded.  She also reports that she has had right-sided ear pain and pressure.  She also notes that she has had some urinary frequency, though no specific dysuria.  She also notes that she has had a stuffy nose.  No known sick contacts.  No chest pain or shortness of breath.  No syncope.  No abdominal pain, nausea, vomiting, diarrhea.  No fever or chills.  No headache, no paresthesias, no weakness.  No difficulty ambulating.  No actual syncope.  No leg swelling.  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   Left-sided chest wall pain 05/10/2021   Chronic pain syndrome 05/10/2021   COVID-19 01/05/2021   Cough 12/13/2020   Seizure-like activity ([place]) 10/24/2020   Vasovagal syncope 09/01/2020   Anxiety 09/01/2020   Post concussion syndrome 07/05/2020   Heart murmur 07/05/2020   Intractable migraine without aura and without status migrainosus 06/17/2018   Temporomandibular joint disorder (TMJ) 06/17/2018   Abdominal pain    Vomiting           Physical Exam   Triage Vital Signs: ED Triage Vitals  Enc Vitals Group     BP 11/16/22 1416 136/81     Pulse Rate 11/16/22 1416 75     Resp 11/16/22 1416 17     Temp 11/16/22 1416 98.1 F (36.7 C)     Temp Source 11/16/22 1416 Oral     SpO2 11/16/22 1416 100 %     Weight 11/16/22 1418 121 lb (54.9 kg)     Height 11/16/22 1418 '5\' 3"'$  (1.6 m)     Head Circumference --      Peak Flow --      Pain Score 11/16/22 1438 0     Pain Loc --      Pain Edu? --      Excl. in [place]?  --     Most recent vital signs: Vitals:   11/16/22 1416 11/16/22 1844  BP: 136/81 133/76  Pulse: 75 77  Resp: 17 14  Temp: 98.1 F (36.7 C)   SpO2: 100% 100%    Physical Exam Vitals and nursing note reviewed.  Constitutional:      General: Awake and alert. No acute distress.    Appearance: Normal appearance. The patient is normal weight.  HENT:     Head: Normocephalic and atraumatic.     Mouth: Mucous membranes are moist.  Left ear: Normal pinna, canal, and TM Right ear: Normal pinna and canal, though effusion noted posterior to TM.  No erythema to the TM.  No purulent effusion.  No proptosis of pinna.  No mastoid tenderness or erythema Nasal congestion noted Eyes:     General: PERRL. Normal EOMs        Right eye: No discharge.        Left eye: No discharge.     Conjunctiva/sclera: Conjunctivae normal.  Cardiovascular:     Rate and Rhythm: Normal rate and regular rhythm.     Pulses: Normal pulses.  Pulmonary:  Effort: Pulmonary effort is normal. No respiratory distress.     Breath sounds: Normal breath sounds.  Abdominal:     Abdomen is soft. There is no abdominal tenderness. No rebound or guarding. No distention. Musculoskeletal:        General: No swelling. Normal range of motion.     Cervical back: Normal range of motion and neck supple.  Skin:    General: Skin is warm and dry.     Capillary Refill: Capillary refill takes less than 2 seconds.     Findings: No rash.  Neurological:     Mental Status: The patient is awake and alert.  Neurological: GCS 15 alert and oriented x3 Normal speech, no expressive or receptive aphasia or dysarthria Cranial nerves II through XII intact Normal visual fields 5 out of 5 strength in all 4 extremities with intact sensation throughout No extremity drift Normal finger-to-nose testing, no limb or truncal ataxia      ED Results / Procedures / Treatments   Labs (all labs ordered are listed, but only abnormal results are  displayed) Labs Reviewed  URINALYSIS, ROUTINE W REFLEX MICROSCOPIC - Abnormal; Notable for the following components:      Result Value   Color, Urine STRAW (*)    APPearance CLEAR (*)    Hgb urine dipstick SMALL (*)    All other components within normal limits  RESP PANEL BY RT-PCR (RSV, FLU A&B, COVID)  [id number]  GROUP A STREP BY PCR  BASIC METABOLIC PANEL  CBC  POC URINE PREG, ED     EKG     RADIOLOGY     PROCEDURES:  Critical Care performed:   Procedures   MEDICATIONS ORDERED IN ED: Medications - No data to display   IMPRESSION / MDM / Coin / ED COURSE  I reviewed the triage vital signs and the nursing notes.   Differential diagnosis includes, but is not limited to, URI, ear infection, effusion, arrhythmia, electrolyte disarray, urinary tract infection, COVID, flu, strep.  Patient is awake and alert, hemodynamically stable and afebrile.  She demonstrates no acute distress.  Labs obtained in triage are reassuring.  EKG is without arrhythmia.  COVID/flu/RSV and strep swabs were negative.  Urinalysis is normal.  I suspect that her feelings of disequilibrium may be due to her ear effusion.  There is no evidence of Ramsay Hunt syndrome.  She has normal cranial nerves, no evidence of Bell's palsy.  Patient was started on Flonase for her effusion.  We discussed return precautions and outpatient follow-up.  Patient understands and agrees with plan.  She was discharged in stable condition.   Patient's presentation is most consistent with acute complicated illness / injury requiring diagnostic workup.        FINAL CLINICAL IMPRESSION(S) / ED DIAGNOSES   Final diagnoses:  Fluid level behind tympanic membrane of right ear  Nasal congestion     Rx / DC Orders   ED Discharge Orders          Ordered    fluticasone (FLONASE) 50 MCG/ACT nasal spray  Daily        11/16/22 1837             Note:  This document was prepared using Dragon voice  recognition software and may include unintentional dictation errors.   Emeline Gins 11/16/22 1846    Carrie Mew, MD 11/16/22 [place]

## 2022-11-17 ENCOUNTER — Encounter: Payer: Self-pay | Admitting: Dermatology

## 2022-11-28 ENCOUNTER — Ambulatory Visit (INDEPENDENT_AMBULATORY_CARE_PROVIDER_SITE_OTHER): Payer: 59 | Admitting: Family Medicine

## 2022-11-28 ENCOUNTER — Encounter: Payer: Self-pay | Admitting: Family Medicine

## 2022-11-28 VITALS — BP 126/69 | HR 75 | Ht 63.0 in | Wt 125.2 lb

## 2022-11-28 DIAGNOSIS — H6993 Unspecified Eustachian tube disorder, bilateral: Secondary | ICD-10-CM

## 2022-11-28 DIAGNOSIS — J0110 Acute frontal sinusitis, unspecified: Secondary | ICD-10-CM

## 2022-11-28 MED ORDER — AMOXICILLIN-POT CLAVULANATE 875-125 MG PO TABS: 1.0000 | ORAL_TABLET | Freq: Two times a day (BID) | ORAL | 0 refills | Status: DC

## 2022-11-28 MED ORDER — PREDNISONE 10 MG PO TABS: ORAL_TABLET | ORAL | 0 refills | Status: DC

## 2022-11-28 NOTE — Patient Instructions (Addendum)
Thank you for coming to the office today.  You have some Eustachian Tube Dysfunction, this problem is usually caused by some deeper sinus swelling and pressure, causing difficulty of eustachian tubes to clear fluid from behind ear drum. You can have ear pain, pressure, fullness, loss of hearing. Often related to sinus symptoms and sometimes with sinusitis or infection or allergy symptoms.  Start Augmentin antibiotic for 10 days  Add prednisone brief taper for 4 days to help reduce sinus swelling / drain fluid   Treatment: - Start using nasal steroid spray, Flonase 2 sprays in each nostril every day for at least 4-6 weeks, and maybe longer - Start OTC allergy medicine (Claritin, Zyrtec, or Allegra - or generics) once daily - OTC decongestant sudafed is optional  If any significant worsening, loss of hearing, constant pain, fever/chills, or concern for infection - notify office and we can send in an antibiotic. Or if just persistent pressure that is not improving, you may contact me back within 1 week and we can consider a brief course of oral steroid prednisone for 3 days only to help reduce swelling, as discussed this is not ideal treatment and can cause side effects.   Please schedule a Follow-up Appointment to: Return if symptoms worsen or fail to improve.  If you have any other questions or concerns, please feel free to call the office or send a message through [place]. You may also schedule an earlier appointment if necessary.  Additionally, you may be receiving a survey about your experience at our office within a few days to 1 week by e-mail or mail. We value your feedback.  Nobie Putnam, DO [place]

## 2022-11-28 NOTE — Progress Notes (Signed)
Subjective:    Patient ID: April Fleming, female    DOB: 02-05-2001, 21 y.o.   MRN: [id number]  April Fleming is a 21 y.o. female presenting on 11/28/2022 for Ear Drainage, Sinusitis, and Hospitalization Follow-up   HPI  Acute sinusitis Eustachian Tube dysfunction, R only with pain pressure 1st week of October, 09/22/22 with sinusitis symptoms congestion, given rx Methylprednisolone taper, limited relief at that time.  Tried Advil Cold & Sinus temporarily relief Symptoms lingered and persisted then worsened ED visit 11/16/22 - [facility], lightheadedness, and R ear with effusion eustachian tube dysfunc       11/28/2022   10:10 AM 08/22/2022    9:53 AM 02/21/2022    8:42 AM  Depression screen PHQ 2/9  Decreased Interest 0 0 0  Down, Depressed, Hopeless 0 0 0  PHQ - 2 Score 0 0 0  Altered sleeping 0 0 1  Tired, decreased energy 0 1 1  Change in appetite 0 0 0  Feeling bad or failure about yourself  0 0 0  Trouble concentrating 0 0 0  Moving slowly or fidgety/restless 0 0 0  Suicidal thoughts 0 0 0  PHQ-9 Score 0 1 2  Difficult doing work/chores Not difficult at all Not difficult at all Not difficult at all    Social History   Tobacco Use   Smoking status: Never   Smokeless tobacco: Never  Vaping Use   Vaping Use: Former   Quit date: 05/12/2019   Substances: Nicotine, Flavoring   Devices: self-filled vaporizer  Substance Use Topics   Alcohol use: Yes    Comment: occasionally   Drug use: Never    Review of Systems Per HPI unless specifically indicated above     Objective:    BP 126/69   Pulse 75   Ht '5\' 3"'$  (1.6 m)   Wt 125 lb 3.2 oz (56.8 kg)   LMP 09/28/2022   SpO2 100%   BMI 22.18 kg/m   Wt Readings from Last 3 Encounters:  11/28/22 125 lb 3.2 oz (56.8 kg)  11/16/22 121 lb (54.9 kg)  08/22/22 126 lb (57.2 kg)    Physical Exam Vitals and nursing note reviewed.  Constitutional:      General: She is not in acute distress.    Appearance: Normal  appearance. She is well-developed. She is not diaphoretic.     Comments: Well-appearing, comfortable, cooperative  HENT:     Head: Normocephalic and atraumatic.     Right Ear: There is no impacted cerumen.     Left Ear: There is no impacted cerumen.     Ears:     Comments: Right ear with increased effusion bulging fullness Left TM with mild effusion no bulging. No Erythema Eyes:     General:        Right eye: No discharge.        Left eye: No discharge.     Conjunctiva/sclera: Conjunctivae normal.  Cardiovascular:     Rate and Rhythm: Normal rate.  Pulmonary:     Effort: Pulmonary effort is normal.  Skin:    General: Skin is warm and dry.     Findings: No erythema or rash.  Neurological:     Mental Status: She is alert and oriented to person, place, and time.  Psychiatric:        Mood and Affect: Mood normal.        Behavior: Behavior normal.        Thought Content: Thought  content normal.     Comments: Well groomed, good eye contact, normal speech and thoughts    Results for orders placed or performed during the hospital encounter of 11/16/22  Resp panel by RT-PCR (RSV, Flu A&B, Covid) Anterior Nasal Swab   Specimen: Anterior Nasal Swab  Result Value Ref Range   SARS Coronavirus 2 by RT PCR NEGATIVE NEGATIVE   Influenza A by PCR NEGATIVE NEGATIVE   Influenza B by PCR NEGATIVE NEGATIVE   Resp Syncytial Virus by PCR NEGATIVE NEGATIVE  Group A Strep by PCR   Specimen: Anterior Nasal Swab; Sterile Swab  Result Value Ref Range   Group A Strep by PCR NOT DETECTED NOT DETECTED  Basic metabolic panel  Result Value Ref Range   Sodium 139 135 - 145 mmol/L   Potassium 3.8 3.5 - 5.1 mmol/L   Chloride 106 98 - 111 mmol/L   CO2 25 22 - 32 mmol/L   Glucose, Bld 95 70 - 99 mg/dL   BUN 11 6 - 20 mg/dL   Creatinine, Ser 0.81 0.44 - 1.00 mg/dL   Calcium 9.6 8.9 - 10.3 mg/dL   GFR, Estimated >60 >60 mL/min   Anion gap 8 5 - 15  CBC  Result Value Ref Range   WBC 8.6 4.0 - 10.5  K/uL   RBC 4.58 3.87 - 5.11 MIL/uL   Hemoglobin 13.6 12.0 - 15.0 g/dL   HCT 39.7 36.0 - 46.0 %   MCV 86.7 80.0 - 100.0 fL   MCH 29.7 26.0 - 34.0 pg   MCHC 34.3 30.0 - 36.0 g/dL   RDW 11.9 11.5 - 15.5 %   Platelets 232 150 - 400 K/uL   nRBC 0.0 0.0 - 0.2 %  Urinalysis, Routine w reflex microscopic Urine, Unspecified Source  Result Value Ref Range   Color, Urine STRAW (A) YELLOW   APPearance CLEAR (A) CLEAR   Specific Gravity, Urine 1.010 1.005 - 1.030   pH 6.0 5.0 - 8.0   Glucose, UA NEGATIVE NEGATIVE mg/dL   Hgb urine dipstick SMALL (A) NEGATIVE   Bilirubin Urine NEGATIVE NEGATIVE   Ketones, ur NEGATIVE NEGATIVE mg/dL   Protein, ur NEGATIVE NEGATIVE mg/dL   Nitrite NEGATIVE NEGATIVE   Leukocytes,Ua NEGATIVE NEGATIVE   WBC, UA 0-5 0 - 5 WBC/hpf   Bacteria, UA NONE SEEN NONE SEEN   Squamous Epithelial / LPF 0-5 0 - 5  POC urine preg, ED  Result Value Ref Range   Preg Test, Ur NEGATIVE NEGATIVE      Assessment & Plan:   Problem List Items Addressed This Visit   None Visit Diagnoses     Acute non-recurrent frontal sinusitis    -  Primary   Relevant Medications   amoxicillin-clavulanate (AUGMENTIN) 875-125 MG tablet   predniSONE (DELTASONE) 10 MG tablet   Eustachian tube dysfunction, bilateral       Relevant Medications   predniSONE (DELTASONE) 10 MG tablet       Acute R>L eustachian tube dysfunction with secondary R > L ear effusion, without loss of hearing or evidence of AOM Suspect underlying sinusitis.  W/ second sickening duration >2 months now  Plan: - Augmentin course -oral prednisone 4 day brief taper - RESTART using nasal steroid spray, Flonase 2 sprays in each nostril every day for at least 4-6 weeks, and maybe longer - Start OTC allergy medicine (Claritin, Zyrtec, or Allegra - or generics) once daily - OTC decongestant sudafed is optional   Meds ordered this encounter  Medications   amoxicillin-clavulanate (AUGMENTIN) 875-125 MG tablet    Sig: Take 1  tablet by mouth 2 (two) times daily.    Dispense:  20 tablet    Refill:  0   predniSONE (DELTASONE) 10 MG tablet    Sig: Take 4 tabs with breakfast Day 1, 3 tabs Day 2, 2 tabs Day 3, 1 tabs Day 4    Dispense:  10 tablet    Refill:  0      Follow up plan: Return if symptoms worsen or fail to improve.   Nobie Putnam, [facility] 11/28/2022, 10:25 AM

## 2022-12-05 ENCOUNTER — Ambulatory Visit: Payer: Self-pay | Admitting: *Deleted

## 2022-12-05 NOTE — Telephone Encounter (Signed)
  Chief Complaint: Dizziness Symptoms: Seen in OV 11/28/22  "Acute R>L eustachian tube dysfunction with secondary R > L ear effusion Suspect underlying sinusitis"    States has completed steroids, has 2-3 days left of antibiotics. States ears "Feel worse and now dizzy for last 2 days." Reports spinning sensation. States ear pain and vertigo were intermittent when seen in OV, now constant.  Frequency: worsening 2 days ago Pertinent Negatives: Patient denies  Disposition: '[]'$ ED /'[]'$ Urgent Care (no appt availability in office) / '[]'$ Appointment(In office/virtual)/ '[]'$  [place] [facility]/ '[]'$ Home Care/ '[]'$ Refused Recommended Disposition /'[]'$ [place] [facility]/ '[x]'$  Follow-up with PCP Additional Notes: After hours call. No availability tomorrow, needs morning appt., offered [facility] virtual for dizziness. Pt declines, states she will wait to hear Dr. Parks Ranger recommendation.  Please advise Reason for Disposition  [1] MODERATE dizziness (e.g., vertigo; feels very unsteady, interferes with normal activities) AND [2] has NOT been evaluated by doctor (or NP/PA) for this  Answer Assessment - Initial Assessment Questions 1. DESCRIPTION: "Describe your dizziness."     Spinning 2. VERTIGO: "Do you feel like either you or the room is spinning or tilting?"      Yes 3. LIGHTHEADED: "Do you feel lightheaded?" (e.g., somewhat faint, woozy, weak upon standing)     yes 4. SEVERITY: "How bad is it?"  "Can you walk?"   - MILD: Feels slightly dizzy and unsteady, but is walking normally.   - MODERATE: Feels unsteady when walking, but not falling; interferes with normal activities (e.g., school, work).   - SEVERE: Unable to walk without falling, or requires assistance to walk without falling.     Moderate 5. ONSET:  "When did the dizziness begin?"     Worse past 2 days, more constant 6. AGGRAVATING FACTORS: "Does anything make it worse?" (e.g., standing, change in head position)     With movement 7. CAUSE: "What  do you think is causing the dizziness?"     Ears 8. RECURRENT SYMPTOM: "Have you had dizziness before?" If Yes, ask: "When was the last time?" "What happened that time?"     No 9. OTHER SYMPTOMS: "Do you have any other symptoms?" (e.g., headache, weakness, numbness, vomiting, earache)     Both ears, pain worse, constant  Protocols used: Dizziness - Vertigo-A-AH

## 2022-12-06 NOTE — Telephone Encounter (Signed)
Please notify her.  There was no sign of ear infection at our last visit, just fluid and sinus pressure.  The antibiotic was to cover just in case there was sinus infection or developing ear infection.  The steroid should have reduced some sinus swelling pressure to help the fluid drain.  Unfortunately there is no other medication I can really order for this condition.  For Vertigo symptoms, it would be best to do the Home Epley Maneuever exercise to help reset her inner ear and reduce vertigo dizzy spells.  I am willing to refer her to ENT if she prefers but it may take several days to weeks for them to see her.  Please let me know. I am glad to hear she is somewhat improved today.  Nobie Putnam, DO [facility] 12/06/2022, 8:44 AM

## 2023-01-02 ENCOUNTER — Other Ambulatory Visit: Payer: Self-pay | Admitting: Family Medicine

## 2023-01-02 NOTE — Telephone Encounter (Signed)
Requested Prescriptions  Pending Prescriptions Disp Refills   fluticasone (FLONASE) 50 MCG/ACT nasal spray [Pharmacy Med Name: FLUTICASONE PROP 50 MCG SPRAY] 16 mL     Sig: PLACE 1 SPRAY INTO BOTH NOSTRILS DAILY FOR 7 DAYS.     Ear, Nose, and Throat: Nasal Preparations - Corticosteroids Passed - 01/02/2023  8:16 AM      Passed - Valid encounter within last 12 months    Recent Outpatient Visits           1 month ago Acute non-recurrent frontal sinusitis   [place], DO   4 months ago Neck pain on right side   [facility] [place], Coralie Keens, NP   10 months ago Acoustic trauma of left ear   [place], DO   12 months ago Acute intractable tension-type headache   [facility] [place], Coralie Keens, NP   1 year ago Irritable bowel syndrome with diarrhea   [facility] [place], Coralie Keens, [place]

## 2023-01-06 ENCOUNTER — Encounter: Payer: Self-pay | Admitting: Family Medicine

## 2023-01-07 ENCOUNTER — Other Ambulatory Visit: Payer: Self-pay

## 2023-01-07 DIAGNOSIS — J0110 Acute frontal sinusitis, unspecified: Secondary | ICD-10-CM

## 2023-01-07 MED ORDER — FLUTICASONE PROPIONATE 50 MCG/ACT NA SUSP: 1.0000 | Freq: Every day | NASAL | 0 refills | Status: DC

## 2023-03-04 ENCOUNTER — Ambulatory Visit: Payer: Self-pay | Admitting: *Deleted

## 2023-03-04 ENCOUNTER — Ambulatory Visit: Payer: 59 | Admitting: Internal Medicine

## 2023-03-04 ENCOUNTER — Encounter: Payer: Self-pay | Admitting: Internal Medicine

## 2023-03-04 VITALS — BP 134/82 | HR 87 | Temp 96.6°F | Wt 123.0 lb

## 2023-03-04 DIAGNOSIS — N970 Female infertility associated with anovulation: Secondary | ICD-10-CM | POA: Diagnosis not present

## 2023-03-04 DIAGNOSIS — R1031 Right lower quadrant pain: Secondary | ICD-10-CM | POA: Diagnosis not present

## 2023-03-04 DIAGNOSIS — R42 Dizziness and giddiness: Secondary | ICD-10-CM | POA: Diagnosis not present

## 2023-03-04 LAB — POCT URINALYSIS DIPSTICK
Bilirubin, UA: NEGATIVE
Blood, UA: NEGATIVE
Glucose, UA: NEGATIVE
Ketones, UA: NEGATIVE
Leukocytes, UA: NEGATIVE
Nitrite, UA: NEGATIVE
Protein, UA: NEGATIVE
Spec Grav, UA: <=1.005 — AB (ref 1.010–1.025)
Urobilinogen, UA: 0.2 E.U./dL
pH, UA: 6.5 (ref 5.0–8.0)

## 2023-03-04 MED ORDER — MECLIZINE HCL 25 MG PO TABS: 25.0000 mg | ORAL_TABLET | Freq: Three times a day (TID) | ORAL | 0 refills | Status: DC | PRN

## 2023-03-04 NOTE — Patient Instructions (Signed)
Dizziness Dizziness is a common problem. It makes you feel unsteady or light-headed. You may feel like you are about to pass out (faint). Dizziness can lead to getting hurt if you stumble or fall. Dizziness can be caused by many things, including: Medicines. Not having enough water in your body (dehydration). Illness. Follow these instructions at home: Eating and drinking  Drink enough fluid to keep your pee (urine) pale yellow. This helps to keep you from getting dehydrated. Try to drink more clear fluids, such as water. Do not drink alcohol. Limit how much caffeine you drink or eat, if your doctor tells you to do that. Limit how much salt (sodium) you drink or eat, if your doctor tells you to do that. Activity  Avoid making quick movements. Stand up slowly from sitting in a chair, and steady yourself until you feel okay. In the morning, first sit up on the side of the bed. When you feel okay, stand up slowly while you hold onto something. Do this until you know that your balance is okay. If you need to stand in one place for a long time, move your legs often. Tighten and relax the muscles in your legs while you are standing. Do not drive or use machinery if you feel dizzy. Avoid bending down if you feel dizzy. Place items in your home so you can reach them easily without leaning over. Lifestyle Do not smoke or use any products that contain nicotine or tobacco. If you need help quitting, ask your doctor. Try to lower your stress level. You can do this by using methods such as yoga or meditation. Talk with your doctor if you need help. General instructions Watch your dizziness for any changes. Take over-the-counter and prescription medicines only as told by your doctor. Talk with your doctor if you think that you are dizzy because of a medicine that you are taking. Tell a friend or a family member that you are feeling dizzy. If he or she notices any changes in your behavior, have this  person call your doctor. Keep all follow-up visits. Contact a doctor if: Your dizziness does not go away. Your dizziness or light-headedness gets worse. You feel like you may vomit (are nauseous). You have trouble hearing. You have new symptoms. You are unsteady on your feet. You feel like the room is spinning. You have neck pain or a stiff neck. You have a fever. Get help right away if: You vomit or have watery poop (diarrhea), and you cannot eat or drink anything. You have trouble: Talking. Walking. Swallowing. Using your arms, hands, or legs. You feel generally weak. You are not thinking clearly, or you have trouble forming sentences. A friend or family member may notice this. You have: Chest pain. Pain in your belly (abdomen). Shortness of breath. Sweating. Your vision changes. You are bleeding. You have a very bad headache. These symptoms may be an emergency. Get help right away. Call your local emergency services (911 in the [place] Do not wait to see if the symptoms will go away. Do not drive yourself to the hospital. Summary Dizziness makes you feel unsteady or light-headed. You may feel like you are about to pass out (faint). Drink enough fluid to keep your pee (urine) pale yellow. Do not drink alcohol. Avoid making quick movements if you feel dizzy. Watch your dizziness for any changes. This information is not intended to replace advice given to you by your health care provider. Make sure you discuss any questions   you have with your health care provider. Document Revised: 11/13/2020 Document Reviewed: 11/13/2020 Elsevier Patient Education  2023 Elsevier Inc.  

## 2023-03-04 NOTE — Telephone Encounter (Signed)
  Chief Complaint: abdominal pain, dizziness  Symptoms: elevated BP 147/100. Dizzy lightheaded room spinning. Low abdominal pain.  Frequency: today  Pertinent Negatives: Patient denies chest pain no difficulty breathing no fever reported  Disposition: [] ED /[x] Urgent Care (no appt availability in office) / [] Appointment(In office/virtual)/ []  [place] [facility]/ [] Home Care/ [] Refused Recommended Disposition /[]  Mobile Bus/ []  Follow-up with PCP Additional Notes:   Patient reports she will go to walkin clinic/ UC.     Reason for Disposition  [1] MILD-MODERATE pain AND [2] constant AND [3] present > 2 hours  Answer Assessment - Initial Assessment Questions 1. LOCATION: "Where does it hurt?"      Low abdominal  2. RADIATION: "Does the pain shoot anywhere else?" (e.g., chest, back)     na 3. ONSET: "When did the pain begin?" (e.g., minutes, hours or days ago)      Na  4. SUDDEN: "Gradual or sudden onset?"     na 5. PATTERN "Does the pain come and go, or is it constant?"    - If it comes and goes: "How long does it last?" "Do you have pain now?"     (Note: Comes and goes means the pain is intermittent. It goes away completely between bouts.)    - If constant: "Is it getting better, staying the same, or getting worse?"      (Note: Constant means the pain never goes away completely; most serious pain is constant and gets worse.)      Pain now  6. SEVERITY: "How bad is the pain?"  (e.g., Scale 1-10; mild, moderate, or severe)    - MILD (1-3): Doesn't interfere with normal activities, abdomen soft and not tender to touch.     - MODERATE (4-7): Interferes with normal activities or awakens from sleep, abdomen tender to touch.     - SEVERE (8-10): Excruciating pain, doubled over, unable to do any normal activities.       "Feels like pain from ruptured ovary" 7. RECURRENT SYMPTOM: "Have you ever had this type of stomach pain before?" If Yes, ask: "When was the last time?" and  "What happened that time?"      Yes  8. CAUSE: "What do you think is causing the stomach pain?"     Ovary pain  9. RELIEVING/AGGRAVATING FACTORS: "What makes it better or worse?" (e.g., antacids, bending or twisting motion, bowel movement)     na 10. OTHER SYMPTOMS: "Do you have any other symptoms?" (e.g., back pain, diarrhea, fever, urination pain, vomiting)       Dizziness room spinning. Elevated BP 147/100. Low abdominal pain. 11. PREGNANCY: "Is there any chance you are pregnant?" "When was your last menstrual period?"       na  Protocols used: Abdominal Pain - [facility]

## 2023-03-04 NOTE — Progress Notes (Signed)
Subjective:    Patient ID: April Fleming, female    DOB: 2001-10-21, 22 y.o.   MRN: [id number]  HPI  Patient presents to clinic today with complaint of right  lower abdominal pain and lightheadedness.  The abdominal pain started 4 days ago after giving herself an HCG injection for infertility treatments.  This is the first injection that she has done.  She describes the pain as sharp. The pain did not radiate. She denies urinary urgency, dysuria, frequency, or blood in her urine. She denies vaginal discharge, irritation, itching, odor and abnormal vaginal bleeding. She has irregular menstrual periods due to PCOS currently undergoing fertility treatment at [facility]  She reports history of IBS with alternating constipation and diarrhea but this is a chronic issue and not new or worsening.  The pain has improved however she reports lightheadedness and dizziness that started this morning.  She reports initially the dizziness was a sensation that the room was spinning but now more of an intermittent sense of imbalance.  She reports her blood pressure was 174/100 this morning however she has no history of hypertension.  Her BP today is 134/82.  She denies ear pain, runny nose, nasal congestion, sore throat and cough.  She reports she has had fluid behind her eardrums in the past.  She has had similar pain in the past when she had a ruptured ovarian cyst.  She has not tried anything OTC for this.  Review of Systems     Past Medical History:  Diagnosis Date   Abdominal pain    ASD (atrial septal defect)    Costochondritis    COVID-19 12/23/2020   Dysplastic nevus 11/08/2021   mild, R shoulder, recheck at followup   Family history of malignant melanoma    father, deceased   IBS (irritable bowel syndrome)    Irregular periods/menstrual cycles    Migraine headache without aura    Vomiting     Current Outpatient Medications  Medication Sig Dispense Refill   acetaminophen (TYLENOL) 500 MG tablet  Take 500 mg by mouth every 6 (six) hours as needed.     acetaminophen-codeine (TYLENOL #3) 300-30 MG tablet Take 1 tablet by mouth every 4 (four) hours as needed.     amoxicillin-clavulanate (AUGMENTIN) 875-125 MG tablet Take 1 tablet by mouth 2 (two) times daily. 20 tablet 0   aspirin-acetaminophen-caffeine (EXCEDRIN MIGRAINE) [id number] MG tablet Take by mouth every 6 (six) hours as needed for headache.     butalbital-acetaminophen-caffeine (FIORICET) 50-325-40 MG tablet Take 1 tablet by mouth every 4 (four) hours as needed.     chlorhexidine (PERIDEX) 0.12 % solution SWISH 10MLS AND SPIT TWICE A DAY     clotrimazole (LOTRIMIN) 1 % cream APPLY TO AFFECTED AREA TWICE A DAY     cyclobenzaprine (FLEXERIL) 5 MG tablet Take 1 tablet by mouth at bedtime.     fluticasone (FLONASE) 50 MCG/ACT nasal spray Place 1 spray into both nostrils daily for 7 days. 9.9 mL 0   ibuprofen (ADVIL) 200 MG tablet Take 200 mg by mouth every 6 (six) hours as needed.     ibuprofen (ADVIL) 400 MG tablet TAKE 1 TABLET BY MOUTH BY MOUTH EVERY 4 HOURS AS NEEDED FOR PAIN     letrozole (FEMARA) 2.5 MG tablet TAKE 2 TABLETS BY MOUTH ONCE DAILY ON CYCLE DAYS 3-7 10 tablet 3   medroxyPROGESTERone (PROVERA) 10 MG tablet Take 1 tablet (10 mg total) by mouth daily. 10 tablet 0   meloxicam (  MOBIC) 15 MG tablet Take 1 tablet by mouth daily.     methocarbamol (ROBAXIN) 500 MG tablet Take 1,000 mg by mouth 2 (two) times daily as needed.     Multiple Vitamin (MULTIVITAMIN) tablet Take 1 tablet by mouth daily.     naproxen (NAPROSYN) 500 MG tablet TAKE 1 TABLET (500 MG TOTAL) BY MOUTH 2 (TWO) TIMES DAILY WITH A MEAL FOR 14 DAYS.     nortriptyline (PAMELOR) 10 MG capsule INCREASE NORTRIPTYLINE TO 30 MG NIGHTLY FOR 1 WEEK THEN INC TO 40 NIGHTLY FOR HEADACHES IF NEEDED.     ondansetron (ZOFRAN) 4 MG tablet TAKE 1 TABLET BY MOUTH EVERY 8 HOURS AS NEEDED FOR NAUSEA AND VOMITING     ondansetron (ZOFRAN-ODT) 8 MG disintegrating tablet Take 1  tablet (8 mg total) by mouth every 8 (eight) hours as needed for nausea or vomiting. 20 tablet 0   Peppermint Oil 90 MG CPCR Take 1 capsule by mouth 3 (three) times daily as needed.     predniSONE (DELTASONE) 10 MG tablet Take 4 tabs with breakfast Day 1, 3 tabs Day 2, 2 tabs Day 3, 1 tabs Day 4 10 tablet 0   propranolol ER (INDERAL LA) 60 MG 24 hr capsule Take 1 tablet by mouth daily.     rizatriptan (MAXALT) 5 MG tablet MAY TAKE A SECOND DOSE AFTER 2 HOURS IF NEEDED.     sulfamethoxazole-trimethoprim (BACTRIM DS) 800-160 MG tablet Take 1 tablet by mouth 2 (two) times daily.     SUMAtriptan (IMITREX) 25 MG tablet TAKE 1 TABLET BY MOUTH AS NEEDED FOR MIGRAINE     triamcinolone lotion (KENALOG) 0.1 % APPLY TO SCALP AS NEEDED FOR ITCHING ONCE TO TWICE DAILY     venlafaxine (EFFEXOR) 75 MG tablet TAKE A HALF TABLET DAILY FOR 1 WEEK THEN INCREASE TO 1 TABLET DAILY     No current facility-administered medications for this visit.    No Known Allergies  Family History  Problem Relation Age of Onset   COPD Mother    Hypertension Mother    Peptic Ulcer Mother    Melanoma Father 60   Melanoma Maternal Grandmother    Hypertension Maternal Grandmother    Peptic Ulcer Maternal Grandmother    Healthy Maternal Grandfather    Intellectual disability Maternal Grandfather    Dementia Paternal Grandmother    Healthy Other    Healthy Other     Social History   Socioeconomic History   Marital status: Married    Spouse name: Not on file   Number of children: Not on file   Years of education: Not on file   Highest education level: High school graduate  Occupational History   Not on file  Tobacco Use   Smoking status: Never   Smokeless tobacco: Never  Vaping Use   Vaping Use: Former   Quit date: 05/12/2019   Substances: Nicotine, Flavoring   Devices: self-filled vaporizer  Substance and Sexual Activity   Alcohol use: Yes    Comment: occasionally   Drug use: Never   Sexual activity: Yes     Birth control/protection: Pill  Other Topics Concern   Not on file  Social History Narrative   Works at [employer] Care   Social Determinants of Health   Financial Resource Strain: Not on file  Food Insecurity: Not on file  Transportation Needs: Not on file  Physical Activity: Not on file  Stress: Not on file  Social Connections: Not on file  Intimate  Partner Violence: Not on file     Constitutional: Denies fever, malaise, fatigue, headache or abrupt weight changes.  HEENT: Denies eye pain, eye redness, ear pain, ringing in the ears, wax buildup, runny nose, nasal congestion, bloody nose, or sore throat. Respiratory: Denies difficulty breathing, shortness of breath, cough or sputum production.   Cardiovascular: Denies chest pain, chest tightness, palpitations or swelling in the hands or feet.  Gastrointestinal: Patient reports lower abdominal pain, chronic alternating constipation and diarrhea. Denies bloating, or blood in the stool.  GU: Patient reports that she does not menstruate.  Denies urgency, frequency, pain with urination, burning sensation, blood in urine, odor or discharge. Musculoskeletal: Denies decrease in range of motion, difficulty with gait, muscle pain or joint pain and swelling.  Skin: Denies redness, rashes, lesions or ulcercations.  Neurological: Pt reports lightheadedness and dizziness. Denies difficulty with memory, difficulty with speech or problems with balance and coordination.    No other specific complaints in a complete review of systems (except as listed in HPI above).  Objective:   Physical Exam  BP 134/82 (BP Location: Right Arm, Patient Position: Sitting, Cuff Size: Normal)   Pulse 87   Temp (!) 96.6 F (35.9 C) (Temporal)   Wt 123 lb (55.8 kg)   SpO2 98%   BMI 21.79 kg/m   Wt Readings from Last 3 Encounters:  11/28/22 125 lb 3.2 oz (56.8 kg)  11/16/22 121 lb (54.9 kg)  08/22/22 126 lb (57.2 kg)    General: Appears her stated age,  well developed, well nourished in NAD. Skin: Warm, dry and intact.  HEENT: Head: normal shape and size; Eyes: sclera white, no icterus, conjunctiva pink, PERRLA and EOMs intact; Ears: Tm's gray and intact, normal light reflex;  Cardiovascular: Normal rate and rhythm. S1,S2 noted.  Murmur noted.  Pulmonary/Chest: Normal effort and positive vesicular breath sounds. No respiratory distress. No wheezes, rales or ronchi noted.  Abdomen: Soft and mildly tender in the RLQ.  No distention or mass noted.  No CVA tenderness noted. Musculoskeletal:  No difficulty with gait.  Neurological: Alert and oriented. Coordination normal.     BMET    Component Value Date/Time   NA 139 11/16/2022 1418   K 3.8 11/16/2022 1418   CL 106 11/16/2022 1418   CO2 25 11/16/2022 1418   GLUCOSE 95 11/16/2022 1418   BUN 11 11/16/2022 1418   CREATININE 0.81 11/16/2022 1418   CREATININE 0.65 01/08/2022 0840   CALCIUM 9.6 11/16/2022 1418   GFRNONAA >60 11/16/2022 1418   GFRNONAA 129 05/26/2019 0925   GFRAA >60 07/03/2020 1119   GFRAA 150 05/26/2019 0925    Lipid Panel  No results found for: "CHOL", "TRIG", "HDL", "CHOLHDL", "VLDL", "LDLCALC"  CBC    Component Value Date/Time   WBC 8.6 11/16/2022 1418   RBC 4.58 11/16/2022 1418   HGB 13.6 11/16/2022 1418   HCT 39.7 11/16/2022 1418   PLT 232 11/16/2022 1418   MCV 86.7 11/16/2022 1418   MCH 29.7 11/16/2022 1418   MCHC 34.3 11/16/2022 1418   RDW 11.9 11/16/2022 1418   LYMPHSABS 2.0 07/02/2022 1410   MONOABS 0.6 07/02/2022 1410   EOSABS 0.1 07/02/2022 1410   BASOSABS 0.1 07/02/2022 1410    Hgb A1C Lab Results  Component Value Date   HGBA1C 5.1 01/31/2022           Assessment & Plan:   RLQ Abdominal Pain, Dizziness:  I reviewed Ovidrel medication information and it can cause abdominal pain and  dizziness She is already called [facility] infertility center to discuss this and awaiting on callback No signs of ETD on today's exam Encouraged her to push  fluids Urinalysis pending We will check CBC and c-Met as well Advised her to make position changes slowly Rx for meclizine 25 mg every 8 hours as needed for dizziness   Will follow-up after labs with further recommendation and treatment plan, follow-up with your PCP as previously scheduled [name], NP

## 2023-03-05 LAB — COMPLETE METABOLIC PANEL WITH GFR
AG Ratio: 1.8 (calc) (ref 1.0–2.5)
ALT: 12 U/L (ref 6–29)
AST: 16 U/L (ref 10–30)
Albumin: 5.1 g/dL (ref 3.6–5.1)
Alkaline phosphatase (APISO): 45 U/L (ref 31–125)
BUN: 11 mg/dL (ref 7–25)
CO2: 23 mmol/L (ref 20–32)
Calcium: 10.4 mg/dL — ABNORMAL HIGH (ref 8.6–10.2)
Chloride: 103 mmol/L (ref 98–110)
Creat: 0.67 mg/dL (ref 0.50–0.96)
Globulin: 2.9 g/dL (calc) (ref 1.9–3.7)
Glucose, Bld: 111 mg/dL (ref 65–139)
Potassium: 4.5 mmol/L (ref 3.5–5.3)
Sodium: 138 mmol/L (ref 135–146)
Total Bilirubin: 0.2 mg/dL (ref 0.2–1.2)
Total Protein: 8 g/dL (ref 6.1–8.1)
eGFR: 127 mL/min/{1.73_m2} (ref >=60)

## 2023-03-05 LAB — CBC
HCT: 44.2 % (ref 35.0–45.0)
Hemoglobin: 14.8 g/dL (ref 11.7–15.5)
MCH: 30 pg (ref 27.0–33.0)
MCHC: 33.5 g/dL (ref 32.0–36.0)
MCV: 89.5 fL (ref 80.0–100.0)
MPV: 9.4 fL (ref 7.5–12.5)
Platelets: 266 10*3/uL (ref 140–400)
RBC: 4.94 10*6/uL (ref 3.80–5.10)
RDW: 12 % (ref 11.0–15.0)
WBC: 9.9 10*3/uL (ref 3.8–10.8)

## 2023-03-20 ENCOUNTER — Encounter: Payer: Self-pay | Admitting: Family Medicine

## 2023-03-20 ENCOUNTER — Ambulatory Visit (INDEPENDENT_AMBULATORY_CARE_PROVIDER_SITE_OTHER): Payer: 59 | Admitting: Family Medicine

## 2023-03-20 VITALS — BP 126/80 | HR 101 | Ht 63.0 in | Wt 127.0 lb

## 2023-03-20 DIAGNOSIS — F410 Panic disorder [episodic paroxysmal anxiety] without agoraphobia: Secondary | ICD-10-CM

## 2023-03-20 DIAGNOSIS — Q2112 Patent foramen ovale: Secondary | ICD-10-CM

## 2023-03-20 DIAGNOSIS — R011 Cardiac murmur, unspecified: Secondary | ICD-10-CM

## 2023-03-20 DIAGNOSIS — F411 Generalized anxiety disorder: Secondary | ICD-10-CM | POA: Diagnosis not present

## 2023-03-20 MED ORDER — BUSPIRONE HCL 5 MG PO TABS: 5.0000 mg | ORAL_TABLET | Freq: Three times a day (TID) | ORAL | 1 refills | Status: DC | PRN

## 2023-03-20 MED ORDER — ESCITALOPRAM OXALATE 5 MG PO TABS: 5.0000 mg | ORAL_TABLET | Freq: Every day | ORAL | 2 refills | Status: DC

## 2023-03-20 NOTE — Patient Instructions (Addendum)
Thank you for coming to the office today.  When ready, start the Escitalopram (lexapro) 5mg  daily, may take 2-4 weeks for full benefit, usually take in morning same time daily, can take with food, occasional stomach upset. Can adjust dose up if need. 10 to 20mg   A preferred option for pregnancy.  Added Buspar 5mg  up to 3 times a day as needed, this can be taken regularly for period of time if needed, or can be done sporadically and more as needed for more acute or higher anxiety situations. Dose can be adjusted. - Occasoinal dizziness is associated.  NO interactions with the hormonal / other fertility meds.  Heart murmur sounds stable and subtle today. It appears to be very consistent with previous documentation. Last heart evaluation structurally reviewed from 2021. It should not require repeat, but I would support you if you plan to have it looked at again before during or after pregnancy, we can order.  These offices have both PSYCHIATRY doctors and [place] (Virtual Available) [place] [address] [place], [place] [address] Phone: [phone]  [facility] Address: [address] [place], [place] [address] [email or web address] Phone: [phone]  [place] ([place] [facility]) Address: [place] [address], [place], [place] [address] Hours: 8:30AM-5PM Phone: [phone]  [place] (Adult, [place], Geriatric, Counseling) [address], [place], [place] [address] Phone: [phone] Fax: [phone]  [place] [facility] [place], [place] [address] Phone: [phone]  [facility] (All ages) [address] [place], [id number] Phone: [phone] (Option  1) [email or web address]  ----------------------------------------------------------------- THERAPIST ONLY  (No Psychiatry)  [facility] [address], [place] [address] [place] P: [phone]  [facility]) [facility] [address] [place], [place] [address] [phone]  [place].   Address: [address] [place], [place] [address] Hours: Open today  9AM-7PM Phone: [phone]  [facility] [address], [place] Address: [address] [place], [place], [place] [address] Phone: [phone]  [facility] [place],  [address] Phone: [phone]     Please schedule a Follow-up Appointment to: Return in about 3 months (around 06/20/2023) for 3 month follow-up Anxiety med adjust, updates.  If you have any other questions or concerns, please feel free to call the office or send a message through [place]. You may also schedule an earlier appointment if necessary.  Additionally, you may be receiving a survey about your experience at our office within a few days to 1 week by e-mail or mail. We value your feedback.  Nobie Putnam, DO [place]

## 2023-03-20 NOTE — Progress Notes (Signed)
Subjective:    Patient ID: April Fleming, female    DOB: August 06, 2001, 22 y.o.   MRN: [id number]  April Fleming is a 22 y.o. female presenting on 03/20/2023 for Anxiety   HPI  History of Anovulation / Fertility follow up Followed by [facility] Weekly follow-up with ultrasounds On Letrozole and Dexamethasone low dose - ovaries to be more sensitive Ovidrel Trigger shots upcoming as well, next week  PFO / Heart Murmur Requesting Re-check Heart Murmur Recently seen and evaluated and advised she has a heart murmur She has had prior Cardiology work up 2021 with ECHO and MRI Cardiac, they advised her that murmur due to PFO, and no abnormality otherwise. It would not need management or follow up History costochondritis Has seen Rheumatology  Generalized Anxiety  Previously was on Xanax short term by Pediatrician Prior medication Fluoxetine only took for 3 days Previously rx Venlafaxine, Duloxetine but did not take. Worsening anxiety now, feels difficulty managing and interested in resuming therapy.  Episodic Migraines Previously followed by Neurology for Migraines Off Sumatriptan, Rizatriptan, Propranolol,  Now doing well with less migraines after daith ear piercing       03/20/2023    8:28 AM 11/28/2022   10:10 AM 08/22/2022    9:53 AM  Depression screen PHQ 2/9  Decreased Interest 0 0 0  Down, Depressed, Hopeless 1 0 0  PHQ - 2 Score 1 0 0  Altered sleeping 0 0 0  Tired, decreased energy 1 0 1  Change in appetite 0 0 0  Feeling bad or failure about yourself  0 0 0  Trouble concentrating 0 0 0  Moving slowly or fidgety/restless 0 0 0  Suicidal thoughts 0 0 0  PHQ-9 Score 2 0 1  Difficult doing work/chores Not difficult at all Not difficult at all Not difficult at all      03/20/2023    8:28 AM 11/28/2022   10:11 AM 08/22/2022    9:54 AM 05/17/2022   10:49 AM  GAD 7 : Generalized Anxiety Score  Nervous, Anxious, on Edge 3 1 2 1   Control/stop worrying 3  1 2 1   Worry too much - different things 3 1 2 1   Trouble relaxing 0 0 0 0  Restless 0 0 0 0  Easily annoyed or irritable 0 1 2 1   Afraid - awful might happen 3 1 2 1   Total GAD 7 Score 12 5 10 5   Anxiety Difficulty Not difficult at all Not difficult at all  Somewhat difficult      Social History   Tobacco Use   Smoking status: Never   Smokeless tobacco: Never  Vaping Use   Vaping Use: Former   Quit date: 05/12/2019   Substances: Nicotine, Flavoring   Devices: self-filled vaporizer  Substance Use Topics   Alcohol use: Not Currently    Comment: occasionally   Drug use: Never    Review of Systems Per HPI unless specifically indicated above     Objective:    BP 126/80   Pulse (!) 101   Ht 5\' 3"  (1.6 m)   Wt 127 lb (57.6 kg)   SpO2 95%   BMI 22.50 kg/m   Wt Readings from Last 3 Encounters:  03/20/23 127 lb (57.6 kg)  03/04/23 123 lb (55.8 kg)  11/28/22 125 lb 3.2 oz (56.8 kg)    Physical Exam Vitals and nursing note reviewed.  Constitutional:      General: She is not in  acute distress.    Appearance: She is well-developed. She is not diaphoretic.     Comments: Well-appearing, comfortable, cooperative  HENT:     Head: Normocephalic and atraumatic.  Eyes:     General:        Right eye: No discharge.        Left eye: No discharge.     Conjunctiva/sclera: Conjunctivae normal.  Neck:     Thyroid: No thyromegaly.  Cardiovascular:     Rate and Rhythm: Normal rate and regular rhythm.     Heart sounds: Murmur (subtle 1-2 out of 6 mild systolic murmur) heard.  Pulmonary:     Effort: Pulmonary effort is normal. No respiratory distress.     Breath sounds: Normal breath sounds. No wheezing or rales.  Musculoskeletal:        General: Normal range of motion.     Cervical back: Normal range of motion and neck supple.  Lymphadenopathy:     Cervical: No cervical adenopathy.  Skin:    General: Skin is warm and dry.     Findings: No erythema or rash.  Neurological:      Mental Status: She is alert and oriented to person, place, and time.  Psychiatric:        Behavior: Behavior normal.     Comments: Well groomed, good eye contact, normal speech and thoughts    Results for orders placed or performed in visit on 03/04/23  CBC  Result Value Ref Range   WBC 9.9 3.8 - 10.8 Thousand/uL   RBC 4.94 3.80 - 5.10 Million/uL   Hemoglobin 14.8 11.7 - 15.5 g/dL   HCT 44.2 35.0 - 45.0 %   MCV 89.5 80.0 - 100.0 fL   MCH 30.0 27.0 - 33.0 pg   MCHC 33.5 32.0 - 36.0 g/dL   RDW 12.0 11.0 - 15.0 %   Platelets 266 140 - 400 Thousand/uL   MPV 9.4 7.5 - 12.5 fL  COMPLETE METABOLIC PANEL WITH GFR  Result Value Ref Range   Glucose, Bld 111 65 - 139 mg/dL   BUN 11 7 - 25 mg/dL   Creat 0.67 0.50 - 0.96 mg/dL   eGFR 127 > OR = 60 mL/min/1.34m2   BUN/Creatinine Ratio SEE NOTE: 6 - 22 (calc)   Sodium 138 135 - 146 mmol/L   Potassium 4.5 3.5 - 5.3 mmol/L   Chloride 103 98 - 110 mmol/L   CO2 23 20 - 32 mmol/L   Calcium 10.4 (H) 8.6 - 10.2 mg/dL   Total Protein 8.0 6.1 - 8.1 g/dL   Albumin 5.1 3.6 - 5.1 g/dL   Globulin 2.9 1.9 - 3.7 g/dL (calc)   AG Ratio 1.8 1.0 - 2.5 (calc)   Total Bilirubin 0.2 0.2 - 1.2 mg/dL   Alkaline phosphatase (APISO) 45 31 - 125 U/L   AST 16 10 - 30 U/L   ALT 12 6 - 29 U/L  POCT Urinalysis Dipstick  Result Value Ref Range   Color, UA yellow    Clarity, UA clear    Glucose, UA Negative Negative   Bilirubin, UA Negative    Ketones, UA Negative    Spec Grav, UA <=1.005 (A) 1.010 - 1.025   Blood, UA Negative    pH, UA 6.5 5.0 - 8.0   Protein, UA Negative Negative   Urobilinogen, UA 0.2 0.2 or 1.0 E.U./dL   Nitrite, UA Negative    Leukocytes, UA Negative Negative   Appearance     Odor  Assessment & Plan:   Problem List Items Addressed This Visit     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 Can adjust dose up if need. 10 to 20mg   As discussed for future consideration with attempting to conceive - this would be a preferred option during pregnancy. Please discuss with GYN in future  Added Buspar 5mg  up to 3 times a day as needed, this can be taken regularly for period of time if needed, or can be done sporadically and more as needed for more acute or higher anxiety situations. Dose can be adjusted. - Occasoinal dizziness is associated.  NO interactions with the hormonal / other fertility meds.  Heart murmur sounds stable and subtle today. It appears to be very consistent with previous documentation. Last heart evaluation structurally reviewed from 2021. It should not require repeat, but I would support you if you plan to have it looked at again before during or after pregnancy, we can order.  Meds ordered this encounter  Medications   escitalopram (LEXAPRO) 5 MG tablet    Sig: Take 1 tablet (5 mg total) by mouth daily.    Dispense:  30 tablet    Refill:  2   busPIRone (BUSPAR) 5 MG tablet    Sig: Take 1 tablet (5 mg total) by mouth 3 (three) times daily as needed (anxiety).    Dispense:  90 tablet    Refill:  1      Follow up plan: Return in about 3 months (around 06/20/2023) for 3 month follow-up Anxiety med adjust, updates.   Nobie Putnam, [facility] 03/20/2023, 8:59 AM

## 2023-04-26 ENCOUNTER — Other Ambulatory Visit: Payer: Self-pay | Admitting: Family Medicine

## 2023-04-26 DIAGNOSIS — F410 Panic disorder [episodic paroxysmal anxiety] without agoraphobia: Secondary | ICD-10-CM

## 2023-04-28 NOTE — Telephone Encounter (Signed)
Requested medication (s) are due for refill today: yes  Requested medication (s) are on the active medication list: yes  Last refill:  03/20/23  Future visit scheduled: yes  Notes to clinic:  Unable to refill per protocol due to diagnosis code needed.      Requested Prescriptions  Pending Prescriptions Disp Refills   busPIRone (BUSPAR) 5 MG tablet [Pharmacy Med Name: BUSPIRONE HCL 5 MG TABLET] 270 tablet 1    Sig: Take 1 tablet (5 mg total) by mouth 3 (three) times daily as needed (anxiety).     Psychiatry: Anxiolytics/Hypnotics - Non-controlled Passed - 04/26/2023  4:33 AM      Passed - Valid encounter within last 12 months    Recent Outpatient Visits           1 month ago Generalized anxiety disorder with panic attacks   [place] [facility] [place], [name], DO   1 month ago RLQ abdominal pain   [place] [facility] [place], [place] W, NP   5 months ago Acute non-recurrent frontal sinusitis   [place] [facility] [name], DO   8 months ago Neck pain on right side   [place] [facility] [place], Salvadore [place], NP   1 year ago Acoustic trauma of left ear   [place] [facility], [name], DO               escitalopram (LEXAPRO) 5 MG tablet [Pharmacy Med Name: ESCITALOPRAM 5 MG TABLET] 90 tablet 1    Sig: Take 1 tablet (5 mg total) by mouth daily.     Psychiatry:  Antidepressants - SSRI Passed - 04/26/2023  4:33 AM      Passed - Valid encounter within last 6 months    Recent Outpatient Visits           1 month ago Generalized anxiety disorder with panic attacks   [place] [facility] [place], [name], DO   1 month ago RLQ abdominal pain   [place] [facility] [place], [place], NP   5 months ago Acute non-recurrent frontal sinusitis   [facility] [name], DO    8 months ago Neck pain on right side   [place] [facility] [place], Salvadore Oxford, NP   1 year ago Acoustic trauma of left ear   [place] [facility] [place], [name], [place]

## 2023-05-07 ENCOUNTER — Encounter: Payer: Self-pay | Admitting: Family Medicine

## 2023-05-08 ENCOUNTER — Ambulatory Visit: Payer: Self-pay | Admitting: *Deleted

## 2023-05-08 ENCOUNTER — Ambulatory Visit: Payer: 59 | Admitting: Internal Medicine

## 2023-05-08 ENCOUNTER — Encounter: Payer: Self-pay | Admitting: Internal Medicine

## 2023-05-08 VITALS — BP 126/62 | HR 80 | Temp 96.6°F | Wt 125.0 lb

## 2023-05-08 DIAGNOSIS — N970 Female infertility associated with anovulation: Secondary | ICD-10-CM

## 2023-05-08 DIAGNOSIS — E282 Polycystic ovarian syndrome: Secondary | ICD-10-CM | POA: Diagnosis not present

## 2023-05-08 DIAGNOSIS — R1031 Right lower quadrant pain: Secondary | ICD-10-CM | POA: Diagnosis not present

## 2023-05-08 DIAGNOSIS — K582 Mixed irritable bowel syndrome: Secondary | ICD-10-CM

## 2023-05-08 DIAGNOSIS — R110 Nausea: Secondary | ICD-10-CM

## 2023-05-08 NOTE — Telephone Encounter (Signed)
  Chief Complaint: right sided abd pain beside her navel that started this morning.   She did have pain under her right rib area yesterday but that has resolved.   She was bitten by a tick last week in this same area.   Bit mark is not red.   It just itches. Symptoms: Sharp burning pain right lower side of abd.   Nausea.   She has IBS and PCOS.   History of ruptured ovarian cyst several months back.   Frequency: Started this morning Pertinent Negatives: Patient denies vomiting. Disposition: [] ED /[] Urgent Care (no appt availability in office) / [x] Appointment(In office/virtual)/ []  [place] [facility]/ [] Home Care/ [] Refused Recommended Disposition /[] [place] [facility]/ []  Follow-up with PCP Additional Notes: Appt made with Nicki Reaper, NP for 2:00 today.   No appts available with Dr. Althea Charon today.

## 2023-05-08 NOTE — Patient Instructions (Signed)
Abdominal Pain, Adult Many things can cause belly (abdominal) pain. Most times, belly pain is not dangerous. Many cases of belly pain can be watched and treated at home. Sometimes, though, belly pain is serious. Your doctor will try to find the cause of your belly pain. Follow these instructions at home:  Medicines Take over-the-counter and prescription medicines only as told by your doctor. Do not take medicines that help you poop (laxatives) unless told by your doctor. General instructions Watch your belly pain for any changes. Drink enough fluid to keep your pee (urine) pale yellow. Keep all follow-up visits as told by your doctor. This is important. Contact a doctor if: Your belly pain changes or gets worse. You are not hungry, or you lose weight without trying. You are having trouble pooping (constipated) or have watery poop (diarrhea) for more than 2-3 days. You have pain when you pee or poop. Your belly pain wakes you up at night. Your pain gets worse with meals, after eating, or with certain foods. You are vomiting and cannot keep anything down. You have a fever. You have blood in your pee. Get help right away if: Your pain does not go away as soon as your doctor says it should. You cannot stop vomiting. Your pain is only in areas of your belly, such as the right side or the left lower part of the belly. You have bloody or black poop, or poop that looks like tar. You have very bad pain, cramping, or bloating in your belly. You have signs of not having enough fluid or water in your body (dehydration), such as: Dark pee, very little pee, or no pee. Cracked lips. Dry mouth. Sunken eyes. Sleepiness. Weakness. You have trouble breathing or chest pain. Summary Many cases of belly pain can be watched and treated at home. Watch your belly pain for any changes. Take over-the-counter and prescription medicines only as told by your doctor. Contact a doctor if your belly pain  changes or gets worse. Get help right away if you have very bad pain, cramping, or bloating in your belly. This information is not intended to replace advice given to you by your health care provider. Make sure you discuss any questions you have with your health care provider. Document Revised: 04/19/2019 Document Reviewed: 04/19/2019 Elsevier Patient Education  2023 Elsevier Inc.  

## 2023-05-08 NOTE — Telephone Encounter (Signed)
Reason for Disposition  [1] MILD-MODERATE pain AND [2] constant AND [3] present > 2 hours  Answer Assessment - Initial Assessment Questions 1. LOCATION: "Where does it hurt?"      Pain under ribs went away on right side.     This morning I'm having sharp burning pain right lower side of abd and coughing makes it hurt.    I'm having nausea .    I was bit by a tick last week in this area.  No bullseye rash.   It itches intermittently.    2. RADIATION: "Does the pain shoot anywhere else?" (e.g., chest, back)     No 3. ONSET: "When did the pain begin?" (e.g., minutes, hours or days ago)      Started this morning.    I have IBS. 4. SUDDEN: "Gradual or sudden onset?"     Not asked 5. PATTERN "Does the pain come and go, or is it constant?"    - If it comes and goes: "How long does it last?" "Do you have pain now?"     (Note: Comes and goes means the pain is intermittent. It goes away completely between bouts.)    - If constant: "Is it getting better, staying the same, or getting worse?"      (Note: Constant means the pain never goes away completely; most serious pain is constant and gets worse.)      Intermittent    6. SEVERITY: "How bad is the pain?"  (e.g., Scale 1-10; mild, moderate, or severe)    - MILD (1-3): Doesn't interfere with normal activities, abdomen soft and not tender to touch.     - MODERATE (4-7): Interferes with normal activities or awakens from sleep, abdomen tender to touch.     - SEVERE (8-10): Excruciating pain, doubled over, unable to do any normal activities.       Moving makes it worse 8/10.     Coughing makes it worse.   I've had a lot of sinus pressure for last the couple of  weeks. 7. RECURRENT SYMPTOM: "Have you ever had this type of stomach pain before?" If Yes, ask: "When was the last time?" and "What happened that time?"      Yes but it was different.   It started under my ribs and my abd.   I had an ovarian cyst rupture a few months ago.   I have PCOS.     I'm seeing  a fertility dr.    Rudean Curt not on any medicine for that right now.   8. CAUSE: "What do you think is causing the stomach pain?"     I'm not sure   My appendix maybe 9. RELIEVING/AGGRAVATING FACTORS: "What makes it better or worse?" (e.g., antacids, bending or twisting motion, bowel movement)     Moving round and coughing makes it worse. 10. OTHER SYMPTOMS: "Do you have any other symptoms?" (e.g., back pain, diarrhea, fever, urination pain, vomiting)       I have IBS so have occasional diarrhea.   No fever    No vomiting 11. PREGNANCY: "Is there any chance you are pregnant?" "When was your last menstrual period?"       I did a pregnancy test a couple of days ago  and it was negative.  Protocols used: Abdominal Pain - Female-A-AH

## 2023-05-08 NOTE — Progress Notes (Addendum)
Subjective:    Patient ID: April Fleming, female    DOB: 2001/01/16, 22 y.o.   MRN: [id number]  HPI  Patient presents to clinic today with complaint of right abdominal pain.  She reports this started yesterday.  She reports the pain initially was in the RUQ. She describes the pain as sharp, that was worse with movement. The pain subsequently radiated down into her right lower quadrant. She describes this pain as dull and achy. She reports associated nausea but denies vomiting or heartburn. She does have some loose bowel this morning but that is not uncommon for her as she has IBS. She has not been constipated lately. She denies urinary or vaginal or symptoms.  She reports a tick bite in the same area but the tick was not on her for > 24 hours.  The tick bite itches but does not burn. She has not tried anything OTC for this. She reports history of chronic headaches, IBS and has had ovarian cysts in the past.  She is currently undergoing fertility treatments as well. Of note, she has been seen multiple times for RLQ pain in the past. Most recently 03/04/23, urinalysis at that time was negative and pain had improved without intervention. She was seen prior 06/2022 with concern for a ruptured ovarian cyst, CT abd/pelvis at that time was normal.   Review of Systems     Past Medical History:  Diagnosis Date   Abdominal pain    ASD (atrial septal defect)    Costochondritis    COVID-19 12/23/2020   Dysplastic nevus 11/08/2021   mild, R shoulder, recheck at followup   Family history of malignant melanoma    father, deceased   IBS (irritable bowel syndrome)    Irregular periods/menstrual cycles    Migraine headache without aura    Vomiting     Current Outpatient Medications  Medication Sig Dispense Refill   acetaminophen (TYLENOL) 500 MG tablet Take 500 mg by mouth every 6 (six) hours as needed.     acetaminophen-codeine (TYLENOL #3) 300-30 MG tablet Take 1 tablet by mouth every 4 (four)  hours as needed.     aspirin-acetaminophen-caffeine (EXCEDRIN MIGRAINE) 250-250-65 MG tablet Take by mouth every 6 (six) hours as needed for headache.     busPIRone (BUSPAR) 5 MG tablet TAKE 1 TABLET (5 MG TOTAL) BY MOUTH 3 (THREE) TIMES DAILY AS NEEDED (ANXIETY). 270 tablet 1   dexamethasone (DECADRON) 0.5 MG tablet Take 0.5 mg by mouth 2 (two) times daily.     escitalopram (LEXAPRO) 5 MG tablet TAKE 1 TABLET (5 MG TOTAL) BY MOUTH DAILY. 90 tablet 1   fluticasone (FLONASE) 50 MCG/ACT nasal spray Place 1 spray into both nostrils daily for 7 days. 9.9 mL 0   ibuprofen (ADVIL) 200 MG tablet Take 200 mg by mouth every 6 (six) hours as needed. (Patient not taking: Reported on 03/20/2023)     letrozole (FEMARA) 2.5 MG tablet TAKE 2 TABLETS BY MOUTH ONCE DAILY ON CYCLE DAYS 3-7 10 tablet 3   methocarbamol (ROBAXIN) 500 MG tablet Take 1,000 mg by mouth 2 (two) times daily as needed.     Multiple Vitamin (MULTIVITAMIN) tablet Take 1 tablet by mouth daily.     OVIDREL 250 MCG/0.5ML injection Inject 250 mcg into the skin once.     No current facility-administered medications for this visit.    No Known Allergies  Family History  Problem Relation Age of Onset   COPD Mother  Hypertension Mother    Peptic Ulcer Mother    Melanoma Father 58   Melanoma Maternal Grandmother    Hypertension Maternal Grandmother    Peptic Ulcer Maternal Grandmother    Healthy Maternal Grandfather    Intellectual disability Maternal Grandfather    Dementia Paternal Grandmother    Healthy Other    Healthy Other     Social History   Socioeconomic History   Marital status: Married    Spouse name: Not on file   Number of children: Not on file   Years of education: Not on file   Highest education level: High school graduate  Occupational History   Not on file  Tobacco Use   Smoking status: Never   Smokeless tobacco: Never  Vaping Use   Vaping Use: Former   Quit date: 05/12/2019   Substances: Nicotine,  Flavoring   Devices: self-filled vaporizer  Substance and Sexual Activity   Alcohol use: Not Currently    Comment: occasionally   Drug use: Never   Sexual activity: Yes    Birth control/protection: Pill  Other Topics Concern   Not on file  Social History Narrative   Works at [employer] Care   Social Determinants of Health   Financial Resource Strain: Not on file  Food Insecurity: Not on file  Transportation Needs: Not on file  Physical Activity: Not on file  Stress: Not on file  Social Connections: Not on file  Intimate Partner Violence: Not on file     Constitutional: Patient reports not headaches.  Denies fever, malaise, fatigue, or abrupt weight changes.  HEENT: Denies eye pain, eye redness, ear pain, ringing in the ears, wax buildup, runny nose, nasal congestion, bloody nose, or sore throat. Respiratory: Denies difficulty breathing, shortness of breath, cough or sputum production.   Cardiovascular: Denies chest pain, chest tightness, palpitations or swelling in the hands or feet.  Gastrointestinal: Patient reports right lower quadrant abdominal pain, nausea, alternating constipation diarrhea.  Denies bloating, or blood in the stool.  GU: Denies urgency, frequency, pain with urination, burning sensation, blood in urine, odor or discharge. Musculoskeletal: Denies decrease in range of motion, difficulty with gait, muscle pain or joint pain and swelling.  Skin: Denies redness, rashes, lesions or ulcercations.  Neurological: Denies dizziness, difficulty with memory, difficulty with speech or problems with balance and coordination.  Psych: Patient has a history of anxiety.  Denies depression, SI/HI.  No other specific complaints in a complete review of systems (except as listed in HPI above).  Objective:   Physical Exam   BP 126/62 (BP Location: Right Arm, Patient Position: Sitting, Cuff Size: Normal)   Pulse 80   Temp (!) 96.6 F (35.9 C) (Temporal)   Wt 125 lb (56.7 kg)    SpO2 98%   BMI 22.14 kg/m   Wt Readings from Last 3 Encounters:  03/20/23 127 lb (57.6 kg)  03/04/23 123 lb (55.8 kg)  11/28/22 125 lb 3.2 oz (56.8 kg)    General: Appears her stated age, well developed, well nourished in NAD. Skin: Warm, dry and intact.  Scabbed papule noted of right mid abdomen. HEENT: Head: normal shape and size; Eyes: sclera white, no icterus, conjunctiva pink, PERRLA and EOMs intact;  Cardiovascular: Normal rate and rhythm. S1,S2 noted.  Murmur noted.  Pulmonary/Chest: Normal effort and positive vesicular breath sounds. No respiratory distress. No wheezes, rales or ronchi noted.  Abdomen: Soft and mildly tender in the RLQ, no rebound tenderness. Normal bowel sounds. No distention or  masses noted. Liver, spleen and kidneys non palpable. Musculoskeletal:  No difficulty with gait.  Neurological: Alert and oriented.. Coordination normal.  Psychiatric: Mood and affect normal. Behavior is normal. Judgment and thought content normal.    BMET    Component Value Date/Time   NA 138 03/04/2023 1338   K 4.5 03/04/2023 1338   CL 103 03/04/2023 1338   CO2 23 03/04/2023 1338   GLUCOSE 111 03/04/2023 1338   BUN 11 03/04/2023 1338   CREATININE 0.67 03/04/2023 1338   CALCIUM 10.4 (H) 03/04/2023 1338   GFRNONAA >60 11/16/2022 1418   GFRNONAA 129 05/26/2019 0925   GFRAA >60 07/03/2020 1119   GFRAA 150 05/26/2019 0925    Lipid Panel  No results found for: "CHOL", "TRIG", "HDL", "CHOLHDL", "VLDL", "LDLCALC"  CBC    Component Value Date/Time   WBC 9.9 03/04/2023 1338   RBC 4.94 03/04/2023 1338   HGB 14.8 03/04/2023 1338   HCT 44.2 03/04/2023 1338   PLT 266 03/04/2023 1338   MCV 89.5 03/04/2023 1338   MCH 30.0 03/04/2023 1338   MCHC 33.5 03/04/2023 1338   RDW 12.0 03/04/2023 1338   LYMPHSABS 2.0 07/02/2022 1410   MONOABS 0.6 07/02/2022 1410   EOSABS 0.1 07/02/2022 1410   BASOSABS 0.1 07/02/2022 1410    Hgb A1C Lab Results  Component Value Date   HGBA1C  5.1 01/31/2022           Assessment & Plan:   RLQ Abdominal Pain:  No indication for urine testing at this time as she is not having any reported urinary symptoms I think she may be ovulating and this might be the pain that she is feeling She has concerns for appendicitis but she is not as tender as a typical patient with appendicitis and is also not having fever, chills or vomiting Or advised if her symptoms worsen overnight to go to the ER for CT abdomen/pelvis Discussed obtaining imaging however her symptoms do seem to be improving slightly so we opted to just monitor for now Advised her to try Ibuprofen 400 mg every 8 hours as needed   Follow-up with your PCP as previously scheduled Nicki Reaper, NP

## 2023-05-12 ENCOUNTER — Encounter: Payer: Self-pay | Admitting: Internal Medicine

## 2023-05-12 MED ORDER — DICYCLOMINE HCL 10 MG PO CAPS: 10.0000 mg | ORAL_CAPSULE | Freq: Three times a day (TID) | ORAL | 0 refills | Status: DC

## 2023-05-12 NOTE — Addendum Note (Signed)
Addended by: [name] on: 05/12/2023 08:52 AM   Modules accepted: Orders

## 2023-05-13 ENCOUNTER — Encounter: Payer: Self-pay | Admitting: Emergency Medicine

## 2023-05-13 ENCOUNTER — Emergency Department: Payer: 59

## 2023-05-13 ENCOUNTER — Other Ambulatory Visit: Payer: Self-pay

## 2023-05-13 ENCOUNTER — Emergency Department
Admission: EM | Admit: 2023-05-13 | Discharge: 2023-05-13 | Disposition: A | Discharge status: Home / Self Care | Payer: 59 | Attending: Emergency Medicine | Admitting: Emergency Medicine

## 2023-05-13 DIAGNOSIS — R110 Nausea: Secondary | ICD-10-CM | POA: Insufficient documentation

## 2023-05-13 DIAGNOSIS — R1011 Right upper quadrant pain: Secondary | ICD-10-CM | POA: Insufficient documentation

## 2023-05-13 DIAGNOSIS — R0789 Other chest pain: Secondary | ICD-10-CM | POA: Insufficient documentation

## 2023-05-13 DIAGNOSIS — R109 Unspecified abdominal pain: Secondary | ICD-10-CM

## 2023-05-13 DIAGNOSIS — R1010 Upper abdominal pain, unspecified: Secondary | ICD-10-CM | POA: Diagnosis present

## 2023-05-13 LAB — CBC WITH DIFFERENTIAL/PLATELET
Abs Immature Granulocytes: 0.01 10*3/uL (ref 0.00–0.07)
Basophils Absolute: 0.1 10*3/uL (ref 0.0–0.1)
Basophils Relative: 2 %
Eosinophils Absolute: 0.3 10*3/uL (ref 0.0–0.5)
Eosinophils Relative: 5 %
HCT: 43.8 % (ref 36.0–46.0)
Hemoglobin: 14.6 g/dL (ref 12.0–15.0)
Immature Granulocytes: 0 %
Lymphocytes Relative: 42 %
Lymphs Abs: 2 10*3/uL (ref 0.7–4.0)
MCH: 30.1 pg (ref 26.0–34.0)
MCHC: 33.3 g/dL (ref 30.0–36.0)
MCV: 90.3 fL (ref 80.0–100.0)
Monocytes Absolute: 0.4 10*3/uL (ref 0.1–1.0)
Monocytes Relative: 8 %
Neutro Abs: 2 10*3/uL (ref 1.7–7.7)
Neutrophils Relative %: 43 %
Platelets: 230 10*3/uL (ref 150–400)
RBC: 4.85 MIL/uL (ref 3.87–5.11)
RDW: 12.1 % (ref 11.5–15.5)
WBC: 4.8 10*3/uL (ref 4.0–10.5)
nRBC: 0 % (ref 0.0–0.2)

## 2023-05-13 LAB — URINALYSIS, ROUTINE W REFLEX MICROSCOPIC
Bilirubin Urine: NEGATIVE
Glucose, UA: NEGATIVE mg/dL
Hgb urine dipstick: NEGATIVE
Ketones, ur: NEGATIVE mg/dL
Leukocytes,Ua: NEGATIVE
Nitrite: NEGATIVE
Protein, ur: NEGATIVE mg/dL
Specific Gravity, Urine: 1.008 (ref 1.005–1.030)
pH: 7 (ref 5.0–8.0)

## 2023-05-13 LAB — COMPREHENSIVE METABOLIC PANEL
ALT: 14 U/L (ref 0–44)
AST: 22 U/L (ref 15–41)
Albumin: 4.6 g/dL (ref 3.5–5.0)
Alkaline Phosphatase: 41 U/L (ref 38–126)
Anion gap: 8 (ref 5–15)
BUN: 12 mg/dL (ref 6–20)
CO2: 26 mmol/L (ref 22–32)
Calcium: 9.9 mg/dL (ref 8.9–10.3)
Chloride: 104 mmol/L (ref 98–111)
Creatinine, Ser: 0.67 mg/dL (ref 0.44–1.00)
GFR, Estimated: >60 mL/min (ref >60)
Glucose, Bld: 105 mg/dL — ABNORMAL HIGH (ref 70–99)
Potassium: 4.6 mmol/L (ref 3.5–5.1)
Sodium: 138 mmol/L (ref 135–145)
Total Bilirubin: 1.1 mg/dL (ref 0.3–1.2)
Total Protein: 8.2 g/dL — ABNORMAL HIGH (ref 6.5–8.1)

## 2023-05-13 LAB — POC URINE PREG, ED: Preg Test, Ur: NEGATIVE

## 2023-05-13 MED ORDER — ONDANSETRON HCL 4 MG/2ML IJ SOLN
4.0000 mg | Freq: Once | INTRAMUSCULAR | Status: AC
  Administered 2023-05-13: 4 mg via INTRAVENOUS
  Filled 2023-05-13: qty 2

## 2023-05-13 MED ORDER — MORPHINE SULFATE (PF) 4 MG/ML IV SOLN
4.0000 mg | Freq: Once | INTRAVENOUS | Status: DC
  Filled 2023-05-13: qty 1

## 2023-05-13 MED ORDER — SODIUM CHLORIDE 0.9 % IV BOLUS
1000.0000 mL | Freq: Once | INTRAVENOUS | Status: AC
  Administered 2023-05-13: 1000 mL via INTRAVENOUS

## 2023-05-13 MED ORDER — KETOROLAC TROMETHAMINE 30 MG/ML IJ SOLN
15.0000 mg | Freq: Once | INTRAMUSCULAR | Status: AC
  Administered 2023-05-13: 15 mg via INTRAVENOUS
  Filled 2023-05-13: qty 1

## 2023-05-13 MED ORDER — IOHEXOL 300 MG/ML  SOLN
100.0000 mL | Freq: Once | INTRAMUSCULAR | Status: AC | PRN
  Administered 2023-05-13: 100 mL via INTRAVENOUS

## 2023-05-13 MED ORDER — ONDANSETRON 4 MG PO TBDP: 4.0000 mg | ORAL_TABLET | Freq: Three times a day (TID) | ORAL | 0 refills | Status: DC | PRN

## 2023-05-13 NOTE — Discharge Instructions (Signed)
Take Naproxen 440 mg (2x 220 mg over-the-counter Alleve) twice a day with meals for 10 days  Drink plenty of fluids  No heavy lifting >15 lb for the next week

## 2023-05-13 NOTE — ED Notes (Signed)
Patient A/O x4 . 12 lead EKG completed. States having aching feeling in abdomen.

## 2023-05-13 NOTE — ED Notes (Signed)
MD in room

## 2023-05-13 NOTE — ED Provider Notes (Signed)
[facility] Provider Note    Event Date/Time   First MD Initiated Contact with Patient 05/13/23 [phone]     (approximate)   History   No chief complaint on file.   HPI  April Fleming is a 22 y.o. female  with PMHx migraine, chest wall pain/chronic pain, here with chest pain. Pt reports that over the past day, she has had sharp, positional pain along her right inferior chest wall/upper abdomen that is positional, significant, but transient in nature. She's had some associated nausea but she has a h/o IBS and cramps. Took her bentyl this am without significant relief. No fevers, chills. No urinary sx. No h/o significant ovarian cysts. Pain worse w/ movement, palpation. No specific alleviating factors.      Physical Exam   Triage Vital Signs: ED Triage Vitals  Enc Vitals Group     BP 05/13/23 0701 (!) 161/98     Pulse Rate 05/13/23 0701 96     Resp 05/13/23 0701 16     Temp 05/13/23 0701 97.6 F (36.4 C)     Temp Source 05/13/23 0701 Oral     SpO2 05/13/23 0701 100 %     Weight 05/13/23 0657 126 lb 15.8 oz (57.6 kg)     Height 05/13/23 0657 5\' 3"  (1.6 m)     Head Circumference --      Peak Flow --      Pain Score 05/13/23 0659 7     Pain Loc --      Pain Edu? --      Excl. in GC? --     Most recent vital signs: Vitals:   05/13/23 0701  BP: (!) 161/98  Pulse: 96  Resp: 16  Temp: 97.6 F (36.4 C)  SpO2: 100%     General: Awake, no distress.  CV:  Good peripheral perfusion. RRR. No murmurs. Resp:  Normal work of breathing. Lungs clear. Abd:  No distention. Moderate RUQ TTP along inferior costal margin. No rebound or guarding.  Other:  MMM. No LE edema or asymmetry.   ED Results / Procedures / Treatments   Labs (all labs ordered are listed, but only abnormal results are displayed) Labs Reviewed  COMPREHENSIVE METABOLIC PANEL - Abnormal; Notable for the following components:      Result Value   Glucose, Bld 105 (*)    Total  Protein 8.2 (*)    All other components within normal limits  URINALYSIS, ROUTINE W REFLEX MICROSCOPIC - Abnormal; Notable for the following components:   Color, Urine YELLOW (*)    APPearance CLEAR (*)    All other components within normal limits  CBC WITH DIFFERENTIAL/PLATELET  POC URINE PREG, ED     EKG Normal sinus rhythm, VR 90. PR 140, QRS 98, QTc 440. No acute st elevations or depressions. No ischemia or infarct.   RADIOLOGY CT: No acute findings, trace R pelvic free fluid   I also independently reviewed and agree with radiologist interpretations.   PROCEDURES:  Critical Care performed: No   MEDICATIONS ORDERED IN ED: Medications  morphine (PF) 4 MG/ML injection 4 mg (4 mg Intravenous Not Given 05/13/23 0806)  ondansetron (ZOFRAN) injection 4 mg (4 mg Intravenous Given 05/13/23 0806)  ketorolac (TORADOL) 30 MG/ML injection 15 mg (15 mg Intravenous Given 05/13/23 0803)  sodium chloride 0.9 % bolus 1,000 mL (1,000 mLs Intravenous New Bag/Given 05/13/23 0807)  iohexol (OMNIPAQUE) 300 MG/ML solution 100 mL (100 mLs Intravenous Contrast Given 05/13/23  1610)     IMPRESSION / MDM / ASSESSMENT AND PLAN / ED COURSE  I reviewed the triage vital signs and the nursing notes.                              Differential diagnosis includes, but is not limited to, msk chest wall pain, abdominal oblique msk pain, cholecystitis, appendicitis, ovarian cyst, UTI, IBS  Patient's presentation is most consistent with acute presentation with potential threat to life or bodily function.    22 yo F here with abdominal pain. Suspect MSK pain in setting of lifting at work and kayaking. Pain is positional and somewhat reproducible. CT scan obtained, reviewed, shows no signs of stone, cyst, appendicitis, or other abnormality. CBC without leukocytosis. CMP unremarkable. UA negative and UPT is negative. VSS.  Will tx with NSAIDs, return precautions.   FINAL CLINICAL IMPRESSION(S) / ED DIAGNOSES    Final diagnoses:  Abdominal wall pain     Rx / DC Orders   ED Discharge Orders          Ordered    ondansetron (ZOFRAN-ODT) 4 MG disintegrating tablet  Every 8 hours PRN        05/13/23 0907             Note:  This document was prepared using Dragon voice recognition software and may include unintentional dictation errors.   [name], MD 05/13/23 [phone]

## 2023-05-13 NOTE — ED Triage Notes (Signed)
Pt presents ambulatory to triage via POV with complaints of R sided flank pain that started last Thursday with worsening sx this AM. Endorses mild nausea due to the pain this AM. No meds taken PTA. A&Ox4 at this time. Denies CP or SOB.

## 2023-05-14 ENCOUNTER — Telehealth: Payer: Self-pay

## 2023-05-14 NOTE — Transitions of Care (Post Inpatient/ED Visit) (Unsigned)
   05/14/2023  Name: April Fleming MRN: [id number] DOB: 08-Jan-2001  Today's TOC FU Call Status: Today's TOC FU Call Status:: Unsuccessul Call (1st Attempt) Unsuccessful Call (1st Attempt) Date: 05/14/23  Attempted to reach the patient regarding the most recent Inpatient/ED visit.  Follow Up Plan: Additional outreach attempts will be made to reach the patient to complete the Transitions of Care (Post Inpatient/ED visit) call.   Oneal Grout, [facility]) [facility] [phone]

## 2023-05-15 NOTE — Transitions of Care (Post Inpatient/ED Visit) (Signed)
05/15/2023  Name: April Fleming MRN: [id number] DOB: 03/14/2001  Today's TOC FU Call Status: Today's TOC FU Call Status:: Successful TOC FU Call Competed Unsuccessful Call (1st Attempt) Date: 05/14/23 [facility] FU Call Complete Date: 05/15/23  Transition Care Management Follow-up Telephone Call Date of Discharge: 05/13/23 Discharge Facility: [facility]) Type of Discharge: Emergency Department How have you been since you were released from the hospital?: Better Any questions or concerns?: No  Items Reviewed: Did you receive and understand the discharge instructions provided?: Yes Medications obtained,verified, and reconciled?: Yes (Medications Reviewed) Any new allergies since your discharge?: No Dietary orders reviewed?: NA Do you have support at home?: Yes  Medications Reviewed Today: Medications Reviewed Today     Reviewed by Lucas Mallow, RN (Registered Nurse) on 05/13/23 at [phone]  Med List Status: <None>   Medication Order Taking? Sig Documenting Provider Last Dose Status Informant  acetaminophen (TYLENOL) 500 MG tablet [id number]  Take 500 mg by mouth every 6 (six) hours as needed. [provider]  Active   acetaminophen-codeine (TYLENOL #3) 300-30 MG tablet [id number]  Take 1 tablet by mouth every 4 (four) hours as needed. [provider]  Active   aspirin-acetaminophen-caffeine (EXCEDRIN MIGRAINE) [phone] MG tablet [id number]  Take by mouth every 6 (six) hours as needed for headache. [provider]  Active   busPIRone (BUSPAR) 5 MG tablet [id number]  TAKE 1 TABLET (5 MG TOTAL) BY MOUTH 3 (THREE) TIMES DAILY AS NEEDED (ANXIETY).  Patient not taking: Reported on 05/08/2023   [name], DO  Active   dexamethasone (DECADRON) 0.5 MG tablet [id number]  Take 0.5 mg by mouth 2 (two) times daily.  Patient not taking: Reported on 05/08/2023   [provider]  Active   dicyclomine (BENTYL) 10 MG capsule  [id number]  Take 1 capsule (10 mg total) by mouth 4 (four) times daily -  before meals and at bedtime. [name], NP  Active   escitalopram (LEXAPRO) 5 MG tablet [id number]  TAKE 1 TABLET (5 MG TOTAL) BY MOUTH DAILY.  Patient not taking: Reported on 05/08/2023   [name], DO  Active   fluticasone [facility]) 50 MCG/ACT nasal spray [id number]  Place 1 spray into both nostrils daily for 7 days. [name], DO  Expired 01/14/23 2359   ibuprofen (ADVIL) 200 MG tablet [id number]  Take 200 mg by mouth every 6 (six) hours as needed. [provider]  Active   letrozole [facility]) 2.5 MG tablet [id number]  TAKE 2 TABLETS BY MOUTH ONCE DAILY ON CYCLE DAYS 3-7  Patient not taking: Reported on 05/08/2023   [name], CNM  Active   methocarbamol (ROBAXIN) 500 MG tablet [id number]  Take 1,000 mg by mouth 2 (two) times daily as needed. [provider]  Active   Multiple Vitamin (MULTIVITAMIN) tablet [id number]  Take 1 tablet by mouth daily. [provider]  Active   OVIDREL 250 MCG/0.5ML injection [id number]  Inject 250 mcg into the skin once.  Patient not taking: Reported on 05/08/2023   [provider]  Active   Med List Note Valerie Salts, RN 05/11/21 9485): 05-11-2021 PA for diclofenac sent via [facility] [id number]            Home Care and Equipment/Supplies: Were Home Health Services Ordered?: NA Any new equipment or medical supplies ordered?: NA  Functional Questionnaire: Do you need assistance with bathing/showering or dressing?: No Do you need assistance with meal  preparation?: No Do you need assistance with eating?: No Do you have difficulty maintaining continence: No Do you need assistance with getting out of bed/getting out of a chair/moving?: No  Follow up appointments reviewed: PCP Follow-up appointment confirmed?: No MD Provider Line Number:[phone] Given: No Specialist Hospital Follow-up appointment confirmed?:  No Do you need transportation to your follow-up appointment?: No Do you understand care options if your condition(s) worsen?: Yes-patient verbalized understanding    Oneal Grout, [facility]) Lovie Macadamia [phone]

## 2023-06-12 ENCOUNTER — Ambulatory Visit
Admission: RE | Admit: 2023-06-12 | Discharge: 2023-06-12 | Disposition: A | Discharge status: Home / Self Care | Payer: 59 | Source: Ambulatory Visit | Attending: Emergency Medicine | Admitting: Emergency Medicine

## 2023-06-12 VITALS — BP 130/91 | HR 84 | Temp 97.8°F | Resp 18

## 2023-06-12 DIAGNOSIS — J069 Acute upper respiratory infection, unspecified: Secondary | ICD-10-CM | POA: Diagnosis not present

## 2023-06-12 DIAGNOSIS — B349 Viral infection, unspecified: Secondary | ICD-10-CM

## 2023-06-12 LAB — POCT RAPID STREP A (OFFICE): Rapid Strep A Screen: NEGATIVE

## 2023-06-12 NOTE — ED Triage Notes (Signed)
Patient to Urgent Care with complaints of sore throat/ possible fevers/ reports having a lot of post nasal drip/ bilateral ear fullness. One episode of emesis. Reports symptoms started two days ago.  Reports husband had the same symptoms last week.  Has been taking tylenol/ Advil cold and sinus. Reports recently finishing course of dexamethasone for fertility treatment.

## 2023-06-12 NOTE — ED Provider Notes (Signed)
April Fleming    CSN: [id number] Arrival date & time: 06/12/23  0815      History   Chief Complaint Chief Complaint  Patient presents with   Sore Throat    Entered by patient    HPI April Fleming is a 22 y.o. female.  Patient presents with sore throat x 2 days.  She also reports subjective fever and 1 episode of emesis yesterday.  Patient also reports ongoing chronic sinus congestion, specifically on the right side.  Treatment with Tylenol and Advil cold medicine.  No rash, shortness of breath, unusual diarrhea, or other symptoms.  Her medical history includes IBS, migraine headaches, TMJ disorder, postconcussion syndrome, anxiety, costochondritis, chronic pain syndrome, seizure-like activity, vasovagal syncope.  The history is provided by the patient and medical records.    Past Medical History:  Diagnosis Date   Abdominal pain    ASD (atrial septal defect)    Costochondritis    COVID-19 12/23/2020   Dysplastic nevus 11/08/2021   mild, R shoulder, recheck at followup   Family history of malignant melanoma    father, deceased   IBS (irritable bowel syndrome)    Irregular periods/menstrual cycles    Migraine headache without aura    Vomiting     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   Left-sided chest wall pain 05/10/2021   Chronic pain syndrome 05/10/2021   Seizure-like activity (HCC) 10/24/2020   Vasovagal syncope 09/01/2020   Generalized anxiety disorder with panic attacks 09/01/2020   Post concussion syndrome 07/05/2020   Heart murmur 07/05/2020   Intractable migraine without aura and without status migrainosus 06/17/2018   Temporomandibular joint disorder (TMJ) 06/17/2018   Vomiting     Past Surgical History:  Procedure Laterality Date   WISDOM TOOTH EXTRACTION      OB History     Gravida  0   Para  0   Term  0   Preterm  0   AB  0   Living  0      SAB  0    IAB  0   Ectopic  0   Multiple  0   Live Births  0            Home Medications    Prior to Admission medications   Medication Sig Start Date End Date Taking? Authorizing Provider  acetaminophen (TYLENOL) 500 MG tablet Take 500 mg by mouth every 6 (six) hours as needed.    [provider]  acetaminophen-codeine (TYLENOL #3) 300-30 MG tablet Take 1 tablet by mouth every 4 (four) hours as needed. Patient not taking: Reported on 06/12/2023    [provider]  aspirin-acetaminophen-caffeine (EXCEDRIN MIGRAINE) [phone] MG tablet Take by mouth every 6 (six) hours as needed for headache. Patient not taking: Reported on 06/12/2023    [provider]  busPIRone (BUSPAR) 5 MG tablet TAKE 1 TABLET (5 MG TOTAL) BY MOUTH 3 (THREE) TIMES DAILY AS NEEDED (ANXIETY). Patient not taking: Reported on 05/08/2023 04/28/23   [name], DO  dexamethasone (DECADRON) 0.5 MG tablet Take 0.5 mg by mouth 2 (two) times daily. Patient not taking: Reported on 05/08/2023 02/20/23   [provider]  dicyclomine (BENTYL) 10 MG capsule Take 1 capsule (10 mg total) by mouth 4 (four) times daily -  before meals and at bedtime. Patient not taking: Reported on 06/12/2023 05/12/23   [name], NP  escitalopram (  LEXAPRO) 5 MG tablet TAKE 1 TABLET (5 MG TOTAL) BY MOUTH DAILY. Patient not taking: Reported on 05/08/2023 04/28/23   [name], DO  fluticasone [facility]) 50 MCG/ACT nasal spray Place 1 spray into both nostrils daily for 7 days. 01/07/23 01/14/23  [name], [name], DO  ibuprofen (ADVIL) 200 MG tablet Take 200 mg by mouth every 6 (six) hours as needed.    [provider]  letrozole (FEMARA) 2.5 MG tablet TAKE 2 TABLETS BY MOUTH ONCE DAILY ON CYCLE DAYS 3-7 Patient not taking: Reported on 05/08/2023 08/21/22   [name], CNM  methocarbamol (ROBAXIN) 500 MG tablet Take 1,000 mg by mouth 2 (two) times daily as needed. Patient not  taking: Reported on 06/12/2023 07/12/21   [provider]  Multiple Vitamin (MULTIVITAMIN) tablet Take 1 tablet by mouth daily.    [provider]  ondansetron (ZOFRAN-ODT) 4 MG disintegrating tablet Take 1 tablet (4 mg total) by mouth every 8 (eight) hours as needed for nausea or vomiting. 05/13/23   [name], MD  OVIDREL 250 MCG/0.5ML injection Inject 250 mcg into the skin once. Patient not taking: Reported on 05/08/2023 01/13/23   [provider]    Family History Family History  Problem Relation Age of Onset   COPD Mother    Hypertension Mother    Peptic Ulcer Mother    Melanoma Father 70   Melanoma Maternal Grandmother    Hypertension Maternal Grandmother    Peptic Ulcer Maternal Grandmother    Healthy Maternal Grandfather    Intellectual disability Maternal Grandfather    Dementia Paternal Grandmother    Healthy Other    Healthy Other     Social History Social History   Tobacco Use   Smoking status: Never   Smokeless tobacco: Never  Vaping Use   Vaping Use: Former   Quit date: 05/12/2019   Substances: Nicotine, Flavoring   Devices: self-filled vaporizer  Substance Use Topics   Alcohol use: Not Currently    Comment: occasionally   Drug use: Never     Allergies   Patient has no known allergies.   Review of Systems Review of Systems  Constitutional:  Negative for chills and fever.  HENT:  Positive for sore throat. Negative for ear pain.   Respiratory:  Negative for cough and shortness of breath.   Cardiovascular:  Negative for chest pain and palpitations.  Gastrointestinal:  Positive for vomiting. Negative for abdominal pain and diarrhea.  Skin:  Negative for rash.     Physical Exam Triage Vital Signs ED Triage Vitals  Enc Vitals Group     BP      Pulse      Resp      Temp      Temp src      SpO2      Weight      Height      Head Circumference      Peak Flow      Pain Score      Pain Loc      Pain Edu?      Excl.  in GC?    No data found.  Updated Vital Signs BP (!) 130/91   Pulse 84   Temp 97.8 F (36.6 C)   Resp 18   LMP 05/30/2023   SpO2 98%   Visual Acuity Right Eye Distance:   Left Eye Distance:   Bilateral Distance:    Right Eye Near:   Left Eye Near:  Bilateral Near:     Physical Exam Vitals and nursing note reviewed.  Constitutional:      General: She is not in acute distress.    Appearance: Normal appearance. She is well-developed. She is not ill-appearing.  HENT:     Right Ear: Tympanic membrane normal.     Left Ear: Tympanic membrane normal.     Nose: Nose normal.     Mouth/Throat:     Mouth: Mucous membranes are moist.     Pharynx: Posterior oropharyngeal erythema present.  Cardiovascular:     Rate and Rhythm: Normal rate and regular rhythm.     Heart sounds: Normal heart sounds.  Pulmonary:     Effort: Pulmonary effort is normal. No respiratory distress.     Breath sounds: Normal breath sounds.  Abdominal:     General: Bowel sounds are normal.     Palpations: Abdomen is soft.     Tenderness: There is no abdominal tenderness. There is no guarding or rebound.  Musculoskeletal:     Cervical back: Neck supple.  Skin:    General: Skin is warm and dry.  Neurological:     Mental Status: She is alert.  Psychiatric:        Mood and Affect: Mood normal.        Behavior: Behavior normal.      UC Treatments / Results  Labs (all labs ordered are listed, but only abnormal results are displayed) Labs Reviewed  POCT RAPID STREP A (OFFICE)    EKG   Radiology No results found.  Procedures Procedures (including critical care time)  Medications Ordered in UC Medications - No data to display  Initial Impression / Assessment and Plan / UC Course  I have reviewed the triage vital signs and the nursing notes.  Pertinent labs & imaging results that were available during my care of the patient were reviewed by me and considered in my medical decision making  (see chart for details).    Viral illness.  Afebrile and VSS.  Rapid strep negative.  Discussed symptomatic treatment including plain Mucinex and Flonase.  Instructed patient to follow up with her PCP if symptoms are not improving.  She agrees to plan of care.   Final Clinical Impressions(s) / UC Diagnoses   Final diagnoses:  Viral illness     Discharge Instructions      Take plain Mucinex and use Flonase as directed.  Follow up with your primary care provider if your symptoms are not improving.        ED Prescriptions   None    PDMP not reviewed this encounter.   Mickie Bail, NP 06/12/23 [phone]

## 2023-06-12 NOTE — Discharge Instructions (Addendum)
Take plain Mucinex and use Flonase as directed.  Follow up with your primary care provider if your symptoms are not improving.    

## 2023-09-03 LAB — HEPATITIS C ANTIBODY: HCV Ab: NEGATIVE

## 2023-09-03 LAB — OB RESULTS CONSOLE HEPATITIS B SURFACE ANTIGEN: Hepatitis B Surface Ag: NEGATIVE

## 2023-09-03 LAB — OB RESULTS CONSOLE GC/CHLAMYDIA
Chlamydia: NEGATIVE
Neisseria Gonorrhea: NEGATIVE

## 2023-09-03 LAB — OB RESULTS CONSOLE RUBELLA ANTIBODY, IGM: Rubella: IMMUNE

## 2023-09-03 LAB — OB RESULTS CONSOLE VARICELLA ZOSTER ANTIBODY, IGG: Varicella: IMMUNE

## 2023-09-03 LAB — OB RESULTS CONSOLE RPR: RPR: NONREACTIVE

## 2023-09-03 LAB — OB RESULTS CONSOLE HIV ANTIBODY (ROUTINE TESTING): HIV: NONREACTIVE

## 2023-09-10 ENCOUNTER — Telehealth: Payer: Self-pay

## 2023-09-10 NOTE — Telephone Encounter (Signed)
Patient reviewed mychart message, and responded. Appointment time, and provider updated.   Thanks!

## 2023-09-10 NOTE — Telephone Encounter (Signed)
Called patient, LVM to call back. Patient is scheduled to see Luella Cook, NP tomorrow AM- however patient should be seeing general cards PA/NP and not EP- we were as needed follow up. Patient aware to give [place] a call back to get rescheduled for a different time.   Left call back number.

## 2023-09-11 ENCOUNTER — Ambulatory Visit: Payer: 59 | Admitting: Cardiology

## 2023-09-11 ENCOUNTER — Ambulatory Visit: Payer: 59 | Attending: Cardiology | Admitting: Cardiology

## 2023-09-11 ENCOUNTER — Encounter: Payer: Self-pay | Admitting: Cardiology

## 2023-09-11 VITALS — BP 128/60 | HR 89 | Ht 64.0 in | Wt 127.6 lb

## 2023-09-11 DIAGNOSIS — R002 Palpitations: Secondary | ICD-10-CM

## 2023-09-11 DIAGNOSIS — R0602 Shortness of breath: Secondary | ICD-10-CM | POA: Diagnosis not present

## 2023-09-11 DIAGNOSIS — Q2112 Patent foramen ovale: Secondary | ICD-10-CM | POA: Diagnosis not present

## 2023-09-11 DIAGNOSIS — R011 Cardiac murmur, unspecified: Secondary | ICD-10-CM

## 2023-09-11 NOTE — Progress Notes (Signed)
Cardiology Office Note:  .   Date:  09/11/2023  ID:  April Fleming, DOB 2000-12-25, MRN [id number] PCP: [name], DO  [facility] Providers Cardiologist:  None Electrophysiologist:  Lanier Prude, MD    History of Present Illness: April Fleming is a 22 y.o. female with a past medical history of an atrial septal defect, heart murmur, migraines, vasovagal syncope, and anxiety, who is here today for follow-up.  She was last seen in clinic 10/04/2020 by Dr. Lalla Brothers.  She had previously undergone echo with a bubble study that did not show bubbles in the left side of the heart although with detailed visualization of the intra atrial septum and right ventricle was very limited.  EKG and physical exam findings were suggestive of an ASD.  Echo did not show right-to-left shunting.  She was scheduled for cardiac MRI with follow-up based on MRI results.  Cardiac MRI revealed a small PFO but no other structural abnormalities were noted.  Further follow-up with EP on an as-needed basis.  She returns to clinic today accompanied by her mother. She states that she has been doing well and is just nervious about her current pregnancy.  She states that she had recently followed up with her OB/GYN and was encouraged to have follow-up echo and will repeat a fetal echocardiogram completed at 22 weeks.  She denies any chest pain, palpitations or peripheral edema.  She does occasionally have shortness of breath when she is exerting herself but quickly recovers.  She has been compliant with her current medications and has not had any recent hospitalizations or emergency department visits.  ROS: Review of systems has been reviewed and considered negative with exception what is been listed in the HPI  Studies Reviewed: .       Cardiac MRI 10/31/2020 IMPRESSION: 1. Small PFO at inferior aspect of atrial septum, with tiny left to right shunt demonstrated by flow dephasing as well as  first pass perfusion. See series 9 images 6-7, series 26-27, image 2. Qp/Qs: 0.98.   3.  No anomalous pulmonary venous return in field of view.   4. Normal right ventricular chamber size by indexed volume, normal right ventricular function visually. Borderline normal calculated RV ejection fraction, 47%, visually appears normal.   5. Normal left ventricular chamber size and function, LVEF 61%. No myocardial edema or delayed myocardial enhancement.  TTE with bubble study 10/03/2020 1. Scant bubbles are noted in the left ventricle with Valsalva. This  could reflect a tiny right-to-left intracardiac shunt or intrapulmonary  shunting.   TTE 09/26/2020 1. Left ventricular ejection fraction, by estimation, is 60 to 65%. The  left ventricle has normal function. The left ventricle has no regional  wall motion abnormalities. Left ventricular diastolic parameters were  normal.   2. Right ventricular systolic function is normal. The right ventricular  size is normal.   3. The mitral valve is normal in structure. Trivial mitral valve  regurgitation. No evidence of mitral stenosis.   4. The aortic valve is tricuspid. Aortic valve regurgitation is not  visualized. No aortic stenosis is present.   5. The inferior vena cava is normal in size with greater than 50%  respiratory variability, suggesting right atrial pressure of 3 mmHg.   Risk Assessment/Calculations:             Physical Exam:   VS:  BP 128/60 (BP Location: Left Arm, Patient Position: Sitting, Cuff Size: Normal)   Pulse 89  Ht 5\' 4"  (1.626 m)   Wt 127 lb 9.6 oz (57.9 kg)   SpO2 99%   BMI 21.90 kg/m    Wt Readings from Last 3 Encounters:  09/11/23 127 lb 9.6 oz (57.9 kg)  05/13/23 126 lb 15.8 oz (57.6 kg)  05/08/23 125 lb (56.7 kg)    GEN: Well nourished, well developed in no acute distress NECK: No JVD; No carotid bruits CARDIAC: RRR, II/VI systolic murmur without rubs or gallops RESPIRATORY:  Clear to auscultation  without rales, wheezing or rhonchi  ABDOMEN: Soft, non-tender, non-distended EXTREMITIES:  No edema; No deformity   ASSESSMENT AND PLAN: .   Heart murmur with small PFO on cardiac MRI completed in 2021.  Patient is currently [redacted] weeks gestation and is previously discussed repeat echocardiograms the middle and end of her pregnancy as she is concerned of pregnancy-induced cardiomyopathy and heart failure.  She is scheduled for an updated echocardiogram today.  She also stated that her OB/GYN recommended a fetal echocardiogram completed at 22 weeks and will follow-up to determine if that is in process or needs to be scheduled.  With her concerns over potential heart failure with pregnancy we have also discussed repeating an echocardiogram after she delivers with a limited study.  She has been encouraged to maintain her weight as recommended by her OB/GYN and avoid high sodium foods that would make her retain fluid.  Dyspnea on exertion that has been stable does monitor her pregnancy.  Stated that it is often a common finding.  Echocardiogram ordered to rule out any change in function or any structural abnormality.  Palpitations that happen only with exertion.  She recovers quickly with rest.  Common occurrence with pregnancy.  Encouraged to decrease caffeine and remain hydrated.  If continues can consider ZIO monitoring at a later date.       Dispo: Patient to return to clinic to see MD/APP in 2 months or sooner if needed  Signed, Raney Antwine, NP

## 2023-09-11 NOTE — Patient Instructions (Signed)
Medication Instructions:  Your physician recommends that you continue on your current medications as directed. Please refer to the Current Medication list given to you today.  *If you need a refill on your cardiac medications before your next appointment, please call your pharmacy*  Lab Work: -None ordered  Testing/Procedures: Your physician has requested that you have an echocardiogram. Echocardiography is a painless test that uses sound waves to create images of your heart. It provides your doctor with information about the size and shape of your heart and how well your heart's chambers and valves are working. This procedure takes approximately one hour. There are no restrictions for this procedure. Please do NOT wear cologne, perfume, aftershave, or lotions (deodorant is allowed). Please arrive 15 minutes prior to your appointment time.   Follow-Up: At [facility], you and your health needs are our priority.  As part of our continuing mission to provide you with exceptional heart care, we have created designated Provider Care Teams.  These Care Teams include your primary Cardiologist (physician) and Advanced Practice Providers (APPs -  Physician Assistants and Nurse Practitioners) who all work together to provide you with the care you need, when you need it.  Your next appointment:   2 month(s)  Provider:   Charlsie Quest, NP    Other Instructions **Schedule fetal echo at 22 weeks**

## 2023-10-03 ENCOUNTER — Other Ambulatory Visit: Payer: Self-pay

## 2023-10-03 DIAGNOSIS — Z8774 Personal history of (corrected) congenital malformations of heart and circulatory system: Secondary | ICD-10-CM

## 2023-10-09 ENCOUNTER — Other Ambulatory Visit: Payer: 59

## 2023-10-11 ENCOUNTER — Other Ambulatory Visit: Payer: Self-pay

## 2023-10-11 ENCOUNTER — Emergency Department: Payer: 59

## 2023-10-11 DIAGNOSIS — Z3A16 16 weeks gestation of pregnancy: Secondary | ICD-10-CM | POA: Insufficient documentation

## 2023-10-11 DIAGNOSIS — O209 Hemorrhage in early pregnancy, unspecified: Secondary | ICD-10-CM | POA: Diagnosis present

## 2023-10-11 DIAGNOSIS — Z5321 Procedure and treatment not carried out due to patient leaving prior to being seen by health care provider: Secondary | ICD-10-CM | POA: Diagnosis not present

## 2023-10-11 LAB — COMPREHENSIVE METABOLIC PANEL
ALT: 12 U/L (ref 0–44)
AST: 19 U/L (ref 15–41)
Albumin: 3.8 g/dL (ref 3.5–5.0)
Alkaline Phosphatase: 36 U/L — ABNORMAL LOW (ref 38–126)
Anion gap: 9 (ref 5–15)
BUN: 7 mg/dL (ref 6–20)
CO2: 21 mmol/L — ABNORMAL LOW (ref 22–32)
Calcium: 9.2 mg/dL (ref 8.9–10.3)
Chloride: 104 mmol/L (ref 98–111)
Creatinine, Ser: 0.43 mg/dL — ABNORMAL LOW (ref 0.44–1.00)
GFR, Estimated: >60 mL/min (ref >60)
Glucose, Bld: 95 mg/dL (ref 70–99)
Potassium: 3.4 mmol/L — ABNORMAL LOW (ref 3.5–5.1)
Sodium: 134 mmol/L — ABNORMAL LOW (ref 135–145)
Total Bilirubin: 0.4 mg/dL (ref 0.3–1.2)
Total Protein: 7.3 g/dL (ref 6.5–8.1)

## 2023-10-11 LAB — CBC WITH DIFFERENTIAL/PLATELET
Abs Immature Granulocytes: 0.09 10*3/uL — ABNORMAL HIGH (ref 0.00–0.07)
Basophils Absolute: 0.1 10*3/uL (ref 0.0–0.1)
Basophils Relative: 1 %
Eosinophils Absolute: 0.3 10*3/uL (ref 0.0–0.5)
Eosinophils Relative: 2 %
HCT: 35.8 % — ABNORMAL LOW (ref 36.0–46.0)
Hemoglobin: 12.6 g/dL (ref 12.0–15.0)
Immature Granulocytes: 1 %
Lymphocytes Relative: 26 %
Lymphs Abs: 3 10*3/uL (ref 0.7–4.0)
MCH: 31.1 pg (ref 26.0–34.0)
MCHC: 35.2 g/dL (ref 30.0–36.0)
MCV: 88.4 fL (ref 80.0–100.0)
Monocytes Absolute: 0.9 10*3/uL (ref 0.1–1.0)
Monocytes Relative: 8 %
Neutro Abs: 7.2 10*3/uL (ref 1.7–7.7)
Neutrophils Relative %: 62 %
Platelets: 225 10*3/uL (ref 150–400)
RBC: 4.05 MIL/uL (ref 3.87–5.11)
RDW: 12.7 % (ref 11.5–15.5)
WBC: 11.5 10*3/uL — ABNORMAL HIGH (ref 4.0–10.5)
nRBC: 0 % (ref 0.0–0.2)

## 2023-10-11 LAB — URINALYSIS, ROUTINE W REFLEX MICROSCOPIC
Bacteria, UA: NONE SEEN
Bilirubin Urine: NEGATIVE
Glucose, UA: NEGATIVE mg/dL
Ketones, ur: NEGATIVE mg/dL
Leukocytes,Ua: NEGATIVE
Nitrite: NEGATIVE
Protein, ur: NEGATIVE mg/dL
Specific Gravity, Urine: 1.003 — ABNORMAL LOW (ref 1.005–1.030)
pH: 7 (ref 5.0–8.0)

## 2023-10-11 LAB — ABO/RH: ABO/RH(D): A POS

## 2023-10-11 LAB — HCG, QUANTITATIVE, PREGNANCY: hCG, Beta Chain, Quant, S: 89170 m[IU]/mL — ABNORMAL HIGH (ref <5)

## 2023-10-11 NOTE — ED Triage Notes (Signed)
~  [redacted] weeks pregnant and noticed vaginal bleeding ~40 minutes prior to arrival.   Also admits to an "ovary-like" pain intermittent for a week that intensified 2 days ago.   BO is at [facility] clinic.

## 2023-10-12 ENCOUNTER — Emergency Department
Admission: EM | Admit: 2023-10-12 | Discharge: 2023-10-12 | Disposition: A | Discharge status: Home / Self Care | Payer: 59 | Attending: Emergency Medicine | Admitting: Emergency Medicine

## 2023-10-16 ENCOUNTER — Ambulatory Visit: Payer: 59 | Attending: Cardiology

## 2023-10-16 DIAGNOSIS — R011 Cardiac murmur, unspecified: Secondary | ICD-10-CM | POA: Diagnosis not present

## 2023-10-17 ENCOUNTER — Encounter: Payer: Self-pay | Admitting: *Deleted

## 2023-10-17 DIAGNOSIS — O4440 Low lying placenta NOS or without hemorrhage, unspecified trimester: Secondary | ICD-10-CM | POA: Insufficient documentation

## 2023-10-17 DIAGNOSIS — Z8774 Personal history of (corrected) congenital malformations of heart and circulatory system: Secondary | ICD-10-CM | POA: Insufficient documentation

## 2023-10-17 LAB — ECHOCARDIOGRAM COMPLETE
Area-P 1/2: 4.31 cm2
S' Lateral: 3.3 cm

## 2023-10-17 NOTE — Telephone Encounter (Signed)
Noted. Thanks!  Result sent via mychart.

## 2023-10-17 NOTE — Progress Notes (Signed)
Heart squeezes 60-65%.  There are no wall motion abnormalities noted.  There are no valvular abnormalities that are noted.  Reassuring study that is slightly improved from prior study and previous study revealed trivial leakage in the mitral valve.

## 2023-10-31 ENCOUNTER — Other Ambulatory Visit: Payer: Self-pay

## 2023-10-31 ENCOUNTER — Ambulatory Visit: Payer: 59

## 2023-10-31 ENCOUNTER — Encounter: Payer: Self-pay | Admitting: *Deleted

## 2023-10-31 ENCOUNTER — Ambulatory Visit: Payer: 59 | Admitting: *Deleted

## 2023-10-31 ENCOUNTER — Other Ambulatory Visit: Payer: Self-pay | Admitting: *Deleted

## 2023-10-31 VITALS — BP 123/55 | HR 77

## 2023-10-31 DIAGNOSIS — O09819 Supervision of pregnancy resulting from assisted reproductive technology, unspecified trimester: Secondary | ICD-10-CM

## 2023-10-31 DIAGNOSIS — O99892 Other specified diseases and conditions complicating childbirth: Secondary | ICD-10-CM | POA: Diagnosis not present

## 2023-10-31 DIAGNOSIS — Z3A18 18 weeks gestation of pregnancy: Secondary | ICD-10-CM | POA: Diagnosis not present

## 2023-10-31 DIAGNOSIS — Z8774 Personal history of (corrected) congenital malformations of heart and circulatory system: Secondary | ICD-10-CM | POA: Insufficient documentation

## 2023-10-31 DIAGNOSIS — O09812 Supervision of pregnancy resulting from assisted reproductive technology, second trimester: Secondary | ICD-10-CM

## 2023-10-31 DIAGNOSIS — O4440 Low lying placenta NOS or without hemorrhage, unspecified trimester: Secondary | ICD-10-CM | POA: Diagnosis present

## 2023-10-31 DIAGNOSIS — O0990 Supervision of high risk pregnancy, unspecified, unspecified trimester: Secondary | ICD-10-CM

## 2023-11-06 ENCOUNTER — Ambulatory Visit: Payer: 59 | Attending: Cardiology | Admitting: Cardiology

## 2023-11-06 ENCOUNTER — Encounter: Payer: Self-pay | Admitting: Cardiology

## 2023-11-06 VITALS — BP 111/71 | HR 90 | Ht 64.0 in | Wt 130.0 lb

## 2023-11-06 DIAGNOSIS — R0609 Other forms of dyspnea: Secondary | ICD-10-CM

## 2023-11-06 DIAGNOSIS — R011 Cardiac murmur, unspecified: Secondary | ICD-10-CM

## 2023-11-06 DIAGNOSIS — R002 Palpitations: Secondary | ICD-10-CM

## 2023-11-06 DIAGNOSIS — Q2112 Patent foramen ovale: Secondary | ICD-10-CM

## 2023-11-06 DIAGNOSIS — R0602 Shortness of breath: Secondary | ICD-10-CM

## 2023-11-06 NOTE — Patient Instructions (Signed)
Medication Instructions:  Your physician recommends that you continue on your current medications as directed. Please refer to the Current Medication list given to you today.  *If you need a refill on your cardiac medications before your next appointment, please call your pharmacy*  Lab Work: - None ordered  Testing/Procedures: Your physician has requested that you have an echocardiogram. Echocardiography is a painless test that uses sound waves to create images of your heart. It provides your doctor with information about the size and shape of your heart and how well your heart's chambers and valves are working. This procedure takes approximately one hour. There are no restrictions for this procedure. Please do NOT wear cologne, perfume, aftershave, or lotions (deodorant is allowed). Please arrive 15 minutes prior to your appointment time.  Please note: We ask at that you not bring children with you during ultrasound (echo/ vascular) testing. Due to room size and safety concerns, children are not allowed in the ultrasound rooms during exams. Our front office staff cannot provide observation of children in our lobby area while testing is being conducted. An adult accompanying a patient to their appointment will only be allowed in the ultrasound room at the discretion of the ultrasound technician under special circumstances. We apologize for any inconvenience.   Follow-Up: At [facility], you and your health needs are our priority.  As part of our continuing mission to provide you with exceptional heart care, we have created designated Provider Care Teams.  These Care Teams include your primary Cardiologist (physician) and Advanced Practice Providers (APPs -  Physician Assistants and Nurse Practitioners) who all work together to provide you with the care you need, when you need it.  Your next appointment:   Follow-up after echocardiogram in May  Provider:   Charlsie Quest, NP    Other  Instructions - None

## 2023-11-06 NOTE — Progress Notes (Signed)
Cardiology Office Note:  .   Date:  11/06/2023  ID:  Nigel Mormon, DOB 08-Apr-2001, MRN [id number] PCP: [name], DO  [facility] Providers Cardiologist:  None Electrophysiologist:  Lanier Prude, MD    History of Present Illness: April Fleming is a 22 y.o. female with past medical history of atrial septal defect, heart murmur, migraines, vasovagal syncope, and anxiety, who is here today for follow-up.  She previously undergone echocardiogram with a bubble study did not show bubbles in the left side of the heart although with detailed visualization in the interatrial septum and right ventricle was very limited. EKG and physical exam findings were suggestive of an ASD.  Echo did not show right-to-left shunting.  She was scheduled for cardiac MRI and based on MRI results revealed a small PFO but no other structural abnormalities were noted.  Further follow-up with the EP was on as needed basis.  She was last seen in clinic 09/11/2023 accompanied by mother.  States that she had been doing well she was just nervous about her current pregnancy.  States she recently followed up with her OB/GYN and was encouraged to follow back up and a repeat fetal echocardiogram completed 22 weeks.  She occasionally has some shortness of breath when she is exerting her heels but quickly recovers.  There were no other symptoms discussed.  She was scheduled for an echocardiogram.  She returns to clinic today stating that she has been doing well.  She is with some occasional dyspnea that has not progressed with her pregnancy thus far.  She was having some issues with sleep and was advised by her OB to take magnesium nightly and her sleep has been better.  She stated that she was still referred for high risk pregnancy and was advised to have a fetal echocardiogram completed 22-week mark.  Stated that she has had her sonograms at her anatomy ultrasound was completed and she has found out  that she was having a little boy.  All of her ultrasounds have been positive thus far.  She is ecstatic about her ongoing pregnancy and is due in April.  ROS: 10 point review of systems has been reviewed and considered negative with exception was been listed in the HPI  Studies Reviewed: Marland Kitchen   EKG Interpretation Date/Time:  Thursday November 06 2023 10:19:48 EST Ventricular Rate:  83 PR Interval:  120 QRS Duration:  92 QT Interval:  360 QTC Calculation: 423 R Axis:   73  Text Interpretation: Normal sinus rhythm with sinus arrhythmia Incomplete right bundle branch block When compared with ECG of 13-May-2023 08:47, No significant change was found Confirmed by Charlsie Quest ([id number]) on 11/06/2023 10:37:30 AM    TTE 10/16/23 1. Left ventricular ejection fraction, by estimation, is 60 to 65%. The  left ventricle has normal function. The left ventricle has no regional  wall motion abnormalities. Left ventricular diastolic parameters were  normal.   2. Right ventricular systolic function is normal. The right ventricular  size is normal. Tricuspid regurgitation signal is inadequate for assessing  PA pressure.   3. The mitral valve is normal in structure. No evidence of mitral valve  regurgitation. No evidence of mitral stenosis.   4. The aortic valve is normal in structure. Aortic valve regurgitation is  not visualized. No aortic stenosis is present.   5. The inferior vena cava is normal in size with greater than 50%  respiratory variability, suggesting right atrial pressure of  3 mmHg.   Cardiac MRI 10/31/2020 IMPRESSION: 1. 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 See series 9 images 6-7, series 26-27, image 2. Qp/Qs: 0.98.   3.  No anomalous pulmonary venous return in field of view.   4. Normal right ventricular chamber size by indexed volume, normal right ventricular function visually.  Borderline normal calculated RV ejection fraction, 47%, visually appears normal.   5. Normal left ventricular chamber size and function, LVEF 61%. No myocardial edema or delayed myocardial enhancement.   TTE with bubble study 10/03/2020 1. Scant bubbles are noted in the left ventricle with Valsalva. This  could reflect a tiny right-to-left intracardiac shunt or intrapulmonary  shunting.    TTE 09/26/2020 1. Left ventricular ejection fraction, by estimation, is 60 to 65%. The  left ventricle has normal function. The left ventricle has no regional  wall motion abnormalities. Left ventricular diastolic parameters were  normal.   2. Right ventricular systolic function is normal. The right ventricular  size is normal.   3. The mitral valve is normal in structure. Trivial mitral valve  regurgitation. No evidence of mitral stenosis.   4. The aortic valve is tricuspid. Aortic valve regurgitation is not  visualized. No aortic stenosis is present.   5. The inferior vena cava is normal in size with greater than 50%  respiratory variability, suggesting right atrial pressure of 3 mmHg.  Risk Assessment/Calculations:             Physical Exam:   VS:  BP 111/71 (BP Location: Left Arm, Patient Position: Sitting, Cuff Size: Normal)   Pulse 90   Ht 5\' 4"  (1.626 m)   Wt 130 lb (59 kg)   LMP 06/26/2023   SpO2 100%   BMI 22.31 kg/m    Wt Readings from Last 3 Encounters:  11/06/23 130 lb (59 kg)  10/11/23 128 lb 6.4 oz (58.2 kg)  09/11/23 127 lb 9.6 oz (57.9 kg)    GEN: Well nourished, well developed in no acute distress NECK: No JVD; No carotid bruits CARDIAC: RRR, II/VI systolic murmur without rubs or gallops RESPIRATORY:  Clear to auscultation without rales, wheezing or rhonchi  ABDOMEN: Soft, non-tender, non-distended EXTREMITIES:  No edema; No deformity   ASSESSMENT AND PLAN: .   Dyspnea on exertion that has been stable.  Previous echocardiogram completed reveals an LVEF of 60 to 65%,  no regional wall motion abnormalities, and no evidence of valvular abnormalities.  She is encouraged to continue to monitor her symptoms and notify the clinic of any changes.  Palpitations that happen only with exertion that have been stable and she quickly recovers.  They have not increased in frequency or intensity.  EKG today completed reveals sinus rhythm with sinus arrhythmia with incomplete right bundle branch block with no acute change from prior studies.  Will defer placing a ZIO monitor at this time.  Heart murmur with small PFO on cardiac MRI completed in 2021.  Patient recent echocardiogram with stable function without structural abnormalities noted.  Due to concern for pregnancy-induced cardiomyopathy and heart failure she will be scheduled for repeat echocardiogram postpartum.       Dispo: Patient to return to clinic to see MD/APP in 6 months or sooner after delivery.  She has been advised for any changes in symptoms or concerns to notify us prior to her return appointment at her return appointment will be moved  to an earlier date.  Signed, Mayar Whittier, NP

## 2023-11-25 ENCOUNTER — Ambulatory Visit: Payer: Self-pay | Admitting: *Deleted

## 2023-11-25 NOTE — Telephone Encounter (Signed)
Reason for Disposition  [1] Redness or swelling on the cheek, forehead or around the eye AND [2] no fever  Answer Assessment - Initial Assessment Questions 1. LOCATION: "Where does it hurt?"      Under eyes,bridge on nose 2. ONSET: "When did the sinus pain start?"  (e.g., hours, days)      Started 1 week ago- got bad yesterday  3. SEVERITY: "How bad is the pain?"   (Scale 1-10; mild, moderate or severe)   - MILD (1-3): doesn't interfere with normal activities    - MODERATE (4-7): interferes with normal activities (e.g., work or school) or awakens from sleep   - SEVERE (8-10): excruciating pain and patient unable to do any normal activities        6/10 4. RECURRENT SYMPTOM: "Have you ever had sinus problems before?" If Yes, ask: "When was the last time?" and "What happened that time?"      Yes- past year 5. NASAL CONGESTION: "Is the nose blocked?" If Yes, ask: "Can you open it or must you breathe through your mouth?"     No blocked- just at night and morning 6. NASAL DISCHARGE: "Do you have discharge from your nose?" If so ask, "What color?"     Varies- yellow/blood, white/clear 7. FEVER: "Do you have a fever?" If Yes, ask: "What is it, how was it measured, and when did it start?"      Not sure- feels flushed 8. OTHER SYMPTOMS: "Do you have any other symptoms?" (e.g., sore throat, cough, earache, difficulty breathing)     Cough, ear pressure 9. PREGNANCY: "Is there any chance you are pregnant?" "When was your last menstrual period?"     Yes- [redacted] weeks pregnant  Protocols used: Sinus Pain or Congestion-A-AH

## 2023-11-25 NOTE — Telephone Encounter (Signed)
Spoke with patient.  She went to urgent care.

## 2023-11-25 NOTE — Telephone Encounter (Signed)
  Chief Complaint: sinus symptoms Symptoms: sinus symptoms-pressure, nasal discharge- blood tinged, headache- behind eyes- pressure in ears Frequency: 1 week- got worse yesterday  Disposition: [] ED /[] Urgent Care (no appt availability in office) / [] Appointment(In office/virtual)/ []  [place] [facility]/ [] Home Care/ [] Refused Recommended Disposition /[] [facility]/ [x]  Follow-up with PCP Additional Notes: Patient wants to be seen- offered VV- but states she feels she needs in person appointment- no open slots and office is closed for lunch- advised will send message

## 2023-11-27 ENCOUNTER — Encounter: Payer: 59 | Admitting: Dermatology

## 2023-11-27 ENCOUNTER — Encounter: Payer: Self-pay | Admitting: Dermatology

## 2023-11-27 ENCOUNTER — Ambulatory Visit: Payer: 59 | Admitting: Dermatology

## 2023-11-27 DIAGNOSIS — L578 Other skin changes due to chronic exposure to nonionizing radiation: Secondary | ICD-10-CM

## 2023-11-27 DIAGNOSIS — D225 Melanocytic nevi of trunk: Secondary | ICD-10-CM | POA: Diagnosis not present

## 2023-11-27 DIAGNOSIS — Z1283 Encounter for screening for malignant neoplasm of skin: Secondary | ICD-10-CM | POA: Diagnosis not present

## 2023-11-27 DIAGNOSIS — Z808 Family history of malignant neoplasm of other organs or systems: Secondary | ICD-10-CM

## 2023-11-27 DIAGNOSIS — D2262 Melanocytic nevi of left upper limb, including shoulder: Secondary | ICD-10-CM

## 2023-11-27 DIAGNOSIS — D229 Melanocytic nevi, unspecified: Secondary | ICD-10-CM

## 2023-11-27 DIAGNOSIS — W908XXA Exposure to other nonionizing radiation, initial encounter: Secondary | ICD-10-CM

## 2023-11-27 DIAGNOSIS — L812 Freckles: Secondary | ICD-10-CM

## 2023-11-27 DIAGNOSIS — Z86018 Personal history of other benign neoplasm: Secondary | ICD-10-CM

## 2023-11-27 DIAGNOSIS — L814 Other melanin hyperpigmentation: Secondary | ICD-10-CM

## 2023-11-27 NOTE — Patient Instructions (Signed)

## 2023-11-27 NOTE — Progress Notes (Signed)
   Follow-Up Visit   Subjective  April Fleming is a 22 y.o. female who presents for the following: Skin Cancer Screening and Full Body Skin Exam, hx of Dysplastic nevi, fhx of Melanoma, check spot L forearm has had for yrs, has gotten "bumpy" past 22m, check spot R bikini area, 12m, no symptoms, check mole R back, has had for yrs, may be darker, pt is [redacted] wks pregnant  The patient presents for Total-Body Skin Exam (TBSE) for skin cancer screening and mole check. The patient has spots, moles and lesions to be evaluated, some may be new or changing and the patient may have concern these could be cancer.    The following portions of the chart were reviewed this encounter and updated as appropriate: medications, allergies, medical history  Review of Systems:  No other skin or systemic complaints except as noted in HPI or Assessment and Plan.  Objective  Well appearing patient in no apparent distress; mood and affect are within normal limits.  A full examination was performed including scalp, head, eyes, ears, nose, lips, neck, chest, axillae, abdomen, back, buttocks, bilateral upper extremities, bilateral lower extremities, hands, feet, fingers, toes, fingernails, and toenails. All findings within normal limits unless otherwise noted below.   Relevant physical exam findings are noted in the Assessment and Plan.    Assessment & Plan   SKIN CANCER SCREENING PERFORMED TODAY.  ACTINIC DAMAGE - Chronic condition, secondary to cumulative UV/sun exposure - diffuse scaly erythematous macules with underlying dyspigmentation - Recommend daily broad spectrum sunscreen SPF 30+ to sun-exposed areas, reapply every 2 hours as needed.  - Staying in the shade or wearing long sleeves, sun glasses (UVA+UVB protection) and wide brim hats (4-inch brim around the entire circumference of the hat) are also recommended for sun protection.  - Call for new or changing lesions.  LENTIGINES - Benign normal skin  lesions - Benign-appearing - Call for any changes  MELANOCYTIC NEVI - Tan-brown and/or pink-flesh-colored symmetric macules and papules - Benign appearing on exam today - Observation - Call clinic for new or changing moles - Recommend daily use of broad spectrum spf 30+ sunscreen to sun-exposed areas.  - regular nevi L forearm, R lower abdomen, R back  FAMILY HISTORY OF SKIN CANCER What type(s):melanoma Who affected:father   HISTORY OF DYSPLASTIC NEVUS No evidence of recurrence today Recommend regular full body skin exams Recommend daily broad spectrum sunscreen SPF 30+ to sun-exposed areas, reapply every 2 hours as needed.  Call if any new or changing lesions are noted between office visits  - R shoulder 11/08/2021  Return in about 1 year (around 11/26/2024) for TBSE, Hx of Dysplastic nevi.  I, Ardis Rowan, RMA, am acting as scribe for Elie Goody, MD .   Documentation: I have reviewed the above documentation for accuracy and completeness, and I agree with the above.  Elie Goody, MD

## 2023-12-05 ENCOUNTER — Ambulatory Visit: Payer: 59

## 2023-12-23 LAB — OB RESULTS CONSOLE RPR: RPR: NONREACTIVE

## 2023-12-23 LAB — OB RESULTS CONSOLE HIV ANTIBODY (ROUTINE TESTING): HIV: NONREACTIVE

## 2023-12-24 NOTE — L&D Delivery Note (Signed)
 Delivery Note  Date of delivery: 03/13/2024 Estimated Date of Delivery: 03/30/24 Patient's last menstrual period was 06/26/2023. EGA: [redacted]w[redacted]d  Delivery Note At 7:26 PM a viable female was delivered via Vaginal, Spontaneous (Presentation: LOA).  APGAR: 8, 9; weight pending.  Placenta status: Spontaneous, Intact.  Cord: 3 vessels with the following complications: None.    First Stage: Labor onset: unknown Induction : Cytotec and Pitocin Analgesia /Anesthesia intrapartum: epidural SROM at [employer] presented to L&D with GHTN. She was induced with pitocin. Epidural placed for pain relief.   Second Stage: Complete dilation at 1720 Onset of pushing at 1905 FHR second stage Cat II Delivery at 1926 on 03/13/2024  She progressed to complete and had a spontaneous vaginal birth of a live female over an intact perineum. The fetal head was delivered in OA position with restitution to LOA. 2 tight nuchal cords noted and resolved with summersault maneuver. Anterior then posterior shoulders delivered spontaneously with minimal assistance. Baby placed on mom's abdomen and attended to by transition RN. Cord clamped and cut after 1+ min by FOB.   Third Stage: Placenta delivered intact with 3VC at 1932 Placenta disposition: routine disposal Uterine tone firm / bleeding min IV pitocin given for hemorrhage prophylaxis  Anesthesia: Epidural Episiotomy: None Lacerations:  none Suture Repair: n/a Est. Blood Loss (mL):  100  Complications: none  Mom to postpartum.  Baby to Couplet care / Skin to Skin.  Newborn: Birth Weight: pending  Apgar Scores: 8, 9 Feeding planned: Breastfeeding   Cyril Mourning, CNM 03/13/2024 7:44 PM

## 2024-01-16 ENCOUNTER — Ambulatory Visit: Payer: Self-pay

## 2024-01-16 DIAGNOSIS — Z9189 Other specified personal risk factors, not elsewhere classified: Secondary | ICD-10-CM

## 2024-01-16 MED ORDER — AMOXICILLIN 500 MG PO CAPS: 500.0000 mg | ORAL_CAPSULE | Freq: Four times a day (QID) | ORAL | 0 refills | Status: DC

## 2024-01-16 NOTE — Addendum Note (Signed)
Addended by: [name] on: 01/16/2024 04:59 PM   Modules accepted: Orders

## 2024-01-16 NOTE — Telephone Encounter (Signed)
  Chief Complaint: medication advice Symptoms: NA Frequency: today Pertinent Negatives: NA Disposition: [] ED /[] Urgent Care (no appt availability in office) / [] Appointment(In office/virtual)/ []  [facility]/ [] Home Care/ [] Refused Recommended Disposition /[] [place] [place] [facility]/ [x]  Follow-up with PCP Additional Notes: pt states she works at [employer] and they had positive dog with lepto no one knew about until now and pt had questions on if she needed abx or not since she had contact with person who had contact with dog. Pt is currently [redacted] weeks pregnant. Recommended I would send to PCP for review and recommendation but she could contact OB as well and they can advise. Pt verbalized understanding and would like call back asap.   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 NAME of MEDICINE: "What medicine(s) are you calling about?"     Unsure if needing abx 2. QUESTION: "What is your question?" (e.g., double dose of medicine, side effect)     Pt had indirect contact with dog who tested positive for Lepto 3. PRESCRIBER: "Who prescribed the medicine?" Reason: if prescribed by specialist, call should be referred to that group.      4. SYMPTOMS: "Do you have any symptoms?" If Yes, ask: "What symptoms are you having?"  "How bad are the symptoms (e.g., mild, moderate, severe)     No  Protocols used: Medication Question Call-A-AH

## 2024-01-16 NOTE — Telephone Encounter (Signed)
Called patient back to confirm more information.  She reports that she was in the room with the dog having surgery. She did not actually directly touch any fluids or blood or urine from the dog. She said the dog had abdominal surgery and blood was present. Urine was not accessible as it was only a catheter specimen that was collected. The dog was tested with a SNAP POC test that showed positive for Leptospirosis but the confirmatory send out lab test would take several days, likely return result on Monday.  She is also in communication with her OBGYN. They are considering lab tests.  I advised her based on CDC guidelines that lab testing for antibody within first several days after exposure and before any symptoms may not be useful as it cannot rule out infection.  She agrees to pick up the Amoxicillin 500mg  4 times per day for 7 day order as a treatment option for mild symptoms if needed. It may not be sufficient for chemoprophylaxis but due to pregnancy would be limited and unable to use doxycycline.  She will be vigilant about monitoring for symptoms especially fever and other concerns, and follow-up promptly if symptoms develop or seek emergency care at hospital setting if needs further assistance for both her and pregnancy care if needed.  I have been updating ID Specialist Dr Rivka Safer throughout the afternoon on this case scenario and she has provided advice to assist me.  Saralyn Pilar, DO [facility] 01/16/2024, 4:59 PM

## 2024-01-16 NOTE — Telephone Encounter (Signed)
Lm for patient to return call.

## 2024-01-16 NOTE — Telephone Encounter (Signed)
I have contacted Dr Darcus [facility] and reviewed clinical information on Leptospirosis.  Reviewing the triage note here, we are still needing some more information to help address this issue.  The human to human transmission is very rare. We will need to know the following:  What date did the exposure to the other person or dog take place?  Did she have any direct exposure to the infected dog? This primarily includes exposure to any possible fluids or urine from the dog, especially if she could have been in contact with her hand if she had a cut or scratch or abrasion - or in contact with any mucus membrane / eye etc.  2.   If she only had contact with a person who contacted the dog, therefore it is indirect exposure. Same questions, did she come in contact with any fluids from the person - such as mucus membrane contact or body fluids.  3.  The triage note said she was asymptomatic. Just need to confirm she remains asymptomatic and no fevers at this time.  4.  Antibiotic prophylaxis is usually for high risk situations and this may not qualify as that. So first is reassurance that infection is unlikely, however if she prefers to take medication we could consider Amoxicillin given the pregnancy, and she could take amoxicillin 500mg  every 6 hours for 1 week as prevention.  Let me know if you can reach her with more of this information.  Saralyn Pilar, DO [facility] 01/16/2024, 3:45 PM

## 2024-02-12 ENCOUNTER — Encounter: Payer: Self-pay | Admitting: Obstetrics and Gynecology

## 2024-02-12 ENCOUNTER — Observation Stay
Admission: EM | Admit: 2024-02-12 | Discharge: 2024-02-12 | Disposition: A | Discharge status: Home / Self Care | Payer: 59 | Attending: Obstetrics | Admitting: Obstetrics

## 2024-02-12 DIAGNOSIS — Z79899 Other long term (current) drug therapy: Secondary | ICD-10-CM | POA: Insufficient documentation

## 2024-02-12 DIAGNOSIS — O99343 Other mental disorders complicating pregnancy, third trimester: Secondary | ICD-10-CM | POA: Diagnosis not present

## 2024-02-12 DIAGNOSIS — Z8759 Personal history of other complications of pregnancy, childbirth and the puerperium: Secondary | ICD-10-CM | POA: Diagnosis not present

## 2024-02-12 DIAGNOSIS — Z8774 Personal history of (corrected) congenital malformations of heart and circulatory system: Secondary | ICD-10-CM | POA: Insufficient documentation

## 2024-02-12 DIAGNOSIS — F419 Anxiety disorder, unspecified: Secondary | ICD-10-CM | POA: Insufficient documentation

## 2024-02-12 DIAGNOSIS — R2233 Localized swelling, mass and lump, upper limb, bilateral: Secondary | ICD-10-CM | POA: Insufficient documentation

## 2024-02-12 DIAGNOSIS — Z7901 Long term (current) use of anticoagulants: Secondary | ICD-10-CM | POA: Diagnosis not present

## 2024-02-12 DIAGNOSIS — R2243 Localized swelling, mass and lump, lower limb, bilateral: Secondary | ICD-10-CM | POA: Diagnosis not present

## 2024-02-12 DIAGNOSIS — O99893 Other specified diseases and conditions complicating puerperium: Principal | ICD-10-CM | POA: Insufficient documentation

## 2024-02-12 DIAGNOSIS — R519 Headache, unspecified: Principal | ICD-10-CM | POA: Diagnosis present

## 2024-02-12 LAB — COMPREHENSIVE METABOLIC PANEL
ALT: 15 U/L (ref 0–44)
AST: 23 U/L (ref 15–41)
Albumin: 2.8 g/dL — ABNORMAL LOW (ref 3.5–5.0)
Alkaline Phosphatase: 80 U/L (ref 38–126)
Anion gap: 9 (ref 5–15)
BUN: 7 mg/dL (ref 6–20)
CO2: 20 mmol/L — ABNORMAL LOW (ref 22–32)
Calcium: 8.7 mg/dL — ABNORMAL LOW (ref 8.9–10.3)
Chloride: 106 mmol/L (ref 98–111)
Creatinine, Ser: 0.4 mg/dL — ABNORMAL LOW (ref 0.44–1.00)
GFR, Estimated: >60 mL/min (ref >60)
Glucose, Bld: 92 mg/dL (ref 70–99)
Potassium: 3.7 mmol/L (ref 3.5–5.1)
Sodium: 135 mmol/L (ref 135–145)
Total Bilirubin: 0.2 mg/dL (ref 0.0–1.2)
Total Protein: 6.3 g/dL — ABNORMAL LOW (ref 6.5–8.1)

## 2024-02-12 LAB — CBC
HCT: 34.8 % — ABNORMAL LOW (ref 36.0–46.0)
Hemoglobin: 11.8 g/dL — ABNORMAL LOW (ref 12.0–15.0)
MCH: 31.2 pg (ref 26.0–34.0)
MCHC: 33.9 g/dL (ref 30.0–36.0)
MCV: 92.1 fL (ref 80.0–100.0)
Platelets: 185 10*3/uL (ref 150–400)
RBC: 3.78 MIL/uL — ABNORMAL LOW (ref 3.87–5.11)
RDW: 13.2 % (ref 11.5–15.5)
WBC: 12.8 10*3/uL — ABNORMAL HIGH (ref 4.0–10.5)
nRBC: 0 % (ref 0.0–0.2)

## 2024-02-12 LAB — PROTEIN / CREATININE RATIO, URINE
Creatinine, Urine: 40 mg/dL
Total Protein, Urine: <6 mg/dL

## 2024-02-12 MED ORDER — RIBOFLAVIN 400 MG PO TABS: 400.0000 mg | ORAL_TABLET | Freq: Two times a day (BID) | ORAL | Status: DC

## 2024-02-12 MED ORDER — ACETAMINOPHEN 325 MG PO TABS
650.0000 mg | ORAL_TABLET | Freq: Once | ORAL | Status: AC
  Administered 2024-02-12: 650 mg via ORAL
  Filled 2024-02-12: qty 2

## 2024-02-12 MED ORDER — PROCHLORPERAZINE MALEATE 10 MG PO TABS
10.0000 mg | ORAL_TABLET | Freq: Once | ORAL | Status: AC
  Administered 2024-02-12: 10 mg via ORAL
  Filled 2024-02-12: qty 1

## 2024-02-12 MED ORDER — MAGNESIUM OXIDE 400 MG PO TABS: 400.0000 mg | ORAL_TABLET | Freq: Every day | ORAL | Status: DC

## 2024-02-12 MED ORDER — LACTATED RINGERS IV BOLUS
1000.0000 mL | Freq: Once | INTRAVENOUS | Status: AC
  Administered 2024-02-12: 1000 mL via INTRAVENOUS

## 2024-02-12 MED ORDER — DIPHENHYDRAMINE HCL 25 MG PO CAPS
25.0000 mg | ORAL_CAPSULE | Freq: Once | ORAL | Status: AC
  Administered 2024-02-12: 25 mg via ORAL
  Filled 2024-02-12: qty 1

## 2024-02-12 MED ORDER — MAGNESIUM OXIDE -MG SUPPLEMENT 400 (240 MG) MG PO TABS
400.0000 mg | ORAL_TABLET | Freq: Once | ORAL | Status: AC
  Administered 2024-02-12: 400 mg via ORAL
  Filled 2024-02-12: qty 1

## 2024-02-12 MED ORDER — ACETAMINOPHEN 500 MG PO TABS: 1000.0000 mg | ORAL_TABLET | Freq: Four times a day (QID) | ORAL | Status: DC | PRN

## 2024-02-12 NOTE — OB Triage Note (Signed)
Patient arrived @ 1215 with complaints of headache. Initial BP 130/71.   Felicia, CNM notified of patient arrival. Labs and medications ordered for headache. Category 1 tracing.   Charnika Herbst C Quetzali Heinle

## 2024-02-12 NOTE — Discharge Summary (Signed)
April Fleming is a 23 y.o. female. She is at [redacted]w[redacted]d gestation. Patient's last menstrual period was 06/26/2023. Estimated Date of Delivery: 03/30/24  Prenatal care site: [facility]  Chief complaint: swelling in hands and feet and a HA that was unrelieved with 500 mg of Tylenol  HPI: April Fleming presents to L&D with complaints of HA unrelieved with 500 mg of tylenol and swelling in hands and feet.  Factors complicating pregnancy: Hx of infertility, conceived with letrozole Hx of ASD anxiety  S: Resting comfortably. no CTX, no VB.no LOF,  Active fetal movement.   Maternal Medical History:  Past Medical Hx:  has a past medical history of Abdominal pain, ASD (atrial septal defect), Costochondritis, COVID-19 (12/23/2020), Dysplastic nevus (11/08/2021), Family history of malignant melanoma, IBS (irritable bowel syndrome), Irregular periods/menstrual cycles, Migraine headache without aura, and Vomiting.    Past Surgical Hx:  has a past surgical history that includes Wisdom tooth extraction.   No Known Allergies   Prior to Admission medications   Medication Sig Start Date End Date Taking? Authorizing Provider  Magnesium Oxide -Mg Supplement 250 MG TABS Take 250 mg by mouth daily.   Yes [provider]  Prenatal Vit-Fe Fumarate-FA (PRENATAL MULTIVITAMIN) TABS tablet Take 1 tablet by mouth daily at 12 noon.   Yes [provider]  acetaminophen (TYLENOL) 500 MG tablet Take 500 mg by mouth every 6 (six) hours as needed. Patient not taking: Reported on 11/06/2023    [provider]  acetaminophen-codeine (TYLENOL #3) 300-30 MG tablet Take 1 tablet by mouth every 4 (four) hours as needed. Patient not taking: Reported on 06/12/2023    [provider]  amoxicillin (AMOXIL) 500 MG capsule Take 1 capsule (500 mg total) by mouth 4 (four) times daily. For 7 days 01/16/24   [name], DO  aspirin-acetaminophen-caffeine (EXCEDRIN MIGRAINE)  [phone] MG tablet Take by mouth every 6 (six) hours as needed for headache. Patient not taking: Reported on 06/12/2023    [provider]  busPIRone (BUSPAR) 5 MG tablet TAKE 1 TABLET (5 MG TOTAL) BY MOUTH 3 (THREE) TIMES DAILY AS NEEDED (ANXIETY). Patient not taking: Reported on 05/08/2023 04/28/23   [name], DO  Cetirizine HCl (ZYRTEC PO) Take 1 tablet by mouth daily. Patient not taking: Reported on 11/06/2023    [provider]  dexamethasone (DECADRON) 0.5 MG tablet Take 0.5 mg by mouth 2 (two) times daily. Patient not taking: Reported on 05/08/2023 02/20/23   [provider]  dicyclomine (BENTYL) 10 MG capsule Take 1 capsule (10 mg total) by mouth 4 (four) times daily -  before meals and at bedtime. Patient not taking: Reported on 06/12/2023 05/12/23   [name], NP  escitalopram (LEXAPRO) 5 MG tablet TAKE 1 TABLET (5 MG TOTAL) BY MOUTH DAILY. Patient not taking: Reported on 05/08/2023 04/28/23   [name], DO  fluticasone [facility]) 50 MCG/ACT nasal spray Place 1 spray into both nostrils daily for 7 days. 01/07/23 01/14/23  [name], [name], DO  ibuprofen (ADVIL) 200 MG tablet Take 200 mg by mouth every 6 (six) hours as needed. Patient not taking: Reported on 09/11/2023    [provider]  letrozole (FEMARA) 2.5 MG tablet TAKE 2 TABLETS BY MOUTH ONCE DAILY ON CYCLE DAYS 3-7 Patient not taking: Reported on 05/08/2023 08/21/22   [name], CNM  methocarbamol (ROBAXIN) 500 MG tablet Take 1,000 mg by mouth 2 (two) times daily as needed. Patient not taking: Reported on  06/12/2023 07/12/21   [provider]  Multiple Vitamin (MULTIVITAMIN) tablet Take 1 tablet by mouth daily. Prenatal vitamin capsule daily Patient not taking: Reported on 11/06/2023    [provider]  ondansetron (ZOFRAN-ODT) 4 MG disintegrating tablet Take 1 tablet (4 mg total) by mouth every 8 (eight) hours as needed for nausea or  vomiting. Patient not taking: Reported on 09/11/2023 05/13/23   [name], MD  OVIDREL 250 MCG/0.5ML injection Inject 250 mcg into the skin once. Patient not taking: Reported on 05/08/2023 01/13/23   [provider]    Social History: She  reports that she has never smoked. She has never used smokeless tobacco. She reports that she does not currently use alcohol. She reports that she does not use drugs.  Family History: family history includes COPD in her mother; Dementia in her paternal grandmother; Healthy in her maternal grandfather and other family members; Hypertension in her maternal grandmother and mother; Intellectual disability in her maternal grandfather; Melanoma in her maternal grandmother; Melanoma (age of onset: 30) in her father; Peptic Ulcer in her maternal grandmother and mother. ,no history of gyn cancers  Review of Systems: A full review of systems was performed and negative except as noted in the HPI.    O:  BP 119/78   Pulse 88   Temp 98.1 F (36.7 C) (Oral)   Resp 20   Ht 5\' 3"  (1.6 m)   Wt 71.2 kg   LMP 06/26/2023   BMI 27.81 kg/m  Results for orders placed or performed during the hospital encounter of 02/12/24 (from the past 48 hours)  Protein / creatinine ratio, urine   Collection Time: 02/12/24  1:04 PM  Result Value Ref Range   Creatinine, Urine 40 mg/dL   Total Protein, Urine <6 mg/dL   Protein Creatinine Ratio        0.00 - 0.15 mg/mg[Cre]  Comprehensive metabolic panel   Collection Time: 02/12/24  1:06 PM  Result Value Ref Range   Sodium 135 135 - 145 mmol/L   Potassium 3.7 3.5 - 5.1 mmol/L   Chloride 106 98 - 111 mmol/L   CO2 20 (L) 22 - 32 mmol/L   Glucose, Bld 92 70 - 99 mg/dL   BUN 7 6 - 20 mg/dL   Creatinine, Ser 0.63 (L) 0.44 - 1.00 mg/dL   Calcium 8.7 (L) 8.9 - 10.3 mg/dL   Total Protein 6.3 (L) 6.5 - 8.1 g/dL   Albumin 2.8 (L) 3.5 - 5.0 g/dL   AST 23 15 - 41 U/L   ALT 15 0 - 44 U/L   Alkaline Phosphatase 80 38 - 126 U/L    Total Bilirubin 0.2 0.0 - 1.2 mg/dL   GFR, Estimated >01 >60 mL/min   Anion gap 9 5 - 15  CBC   Collection Time: 02/12/24  1:06 PM  Result Value Ref Range   WBC 12.8 (H) 4.0 - 10.5 K/uL   RBC 3.78 (L) 3.87 - 5.11 MIL/uL   Hemoglobin 11.8 (L) 12.0 - 15.0 g/dL   HCT 10.9 (L) 32.3 - 55.7 %   MCV 92.1 80.0 - 100.0 fL   MCH 31.2 26.0 - 34.0 pg   MCHC 33.9 30.0 - 36.0 g/dL   RDW 32.2 02.5 - 42.7 %   Platelets 185 150 - 400 K/uL   nRBC 0.0 0.0 - 0.2 %     Constitutional: NAD, AAOx3  HE/ENT: extraocular movements grossly intact, moist mucous membranes CV: RRR PULM: nl respiratory effort,  CTABL Abd: gravid, non-tender, non-distended, soft  Ext: Non-tender, Nonedmeatous Psych: mood appropriate, speech normal Pelvic : deferred SVE: Dilation: Fingertip Effacement (%): Thick Exam by:: Katharina Caper CNM   NST: Baseline FHR: 135 beats/min Variability: moderate Accelerations: present Decelerations: absent Tocometry: 2-10 Time: at least 20 minutes   Interpretation: Category I INDICATIONS: patient reassurance and rule out uterine contractions RESULTS:  A NST procedure was performed with FHR monitoring and a normal baseline established, appropriate time of 20-40 minutes of evaluation, and accels >2 seen w 15x15 characteristics.  Results show a REACTIVE NST.    Assessment: 23 y.o. [redacted]w[redacted]d here for antenatal surveillance during pregnancy. For HA, swelling  and uterine contractions noted during observation period. She reports feeling much better and denies feeling contractions. Cervical exam is unremarkable for labor.  Principle diagnosis:  There were no encounter diagnoses.   Plan: Labor: not present.  Fetal Wellbeing: Reassuring Cat 1 tracing. Reactive NST  IV fluid bolus 1000 ml HA cocktail meds given po PIH labs wnl Encouraged increased hydration and snacks with protein every 2-3 hours Start taking magnesium and b2 daily for headaches D/c home stable, precautions reviewed,  follow-up as scheduled.   ----- Chari Manning, CNM Certified Nurse Midwife [facility]

## 2024-02-12 NOTE — OB Triage Note (Signed)
All lab work and blood pressures normal.   Patient to be discharged home per [place], [place]. Will follow up tomorrow at [facility] office. All information reviewed with patient and questions answered.

## 2024-02-26 ENCOUNTER — Other Ambulatory Visit: Payer: Self-pay

## 2024-02-26 ENCOUNTER — Encounter: Payer: Self-pay | Admitting: Obstetrics and Gynecology

## 2024-02-26 ENCOUNTER — Observation Stay
Admission: EM | Admit: 2024-02-26 | Discharge: 2024-02-26 | Disposition: A | Discharge status: Home / Self Care | Attending: Obstetrics and Gynecology | Admitting: Obstetrics and Gynecology

## 2024-02-26 DIAGNOSIS — Z3A35 35 weeks gestation of pregnancy: Secondary | ICD-10-CM | POA: Diagnosis not present

## 2024-02-26 DIAGNOSIS — O163 Unspecified maternal hypertension, third trimester: Principal | ICD-10-CM | POA: Insufficient documentation

## 2024-02-26 LAB — COMPREHENSIVE METABOLIC PANEL
ALT: 13 U/L (ref 0–44)
AST: 19 U/L (ref 15–41)
Albumin: 3 g/dL — ABNORMAL LOW (ref 3.5–5.0)
Alkaline Phosphatase: 106 U/L (ref 38–126)
Anion gap: 8 (ref 5–15)
BUN: 8 mg/dL (ref 6–20)
CO2: 19 mmol/L — ABNORMAL LOW (ref 22–32)
Calcium: 9.1 mg/dL (ref 8.9–10.3)
Chloride: 106 mmol/L (ref 98–111)
Creatinine, Ser: 0.35 mg/dL — ABNORMAL LOW (ref 0.44–1.00)
GFR, Estimated: >60 mL/min (ref >60)
Glucose, Bld: 70 mg/dL (ref 70–99)
Potassium: 3.8 mmol/L (ref 3.5–5.1)
Sodium: 133 mmol/L — ABNORMAL LOW (ref 135–145)
Total Bilirubin: 0.6 mg/dL (ref 0.0–1.2)
Total Protein: 6.6 g/dL (ref 6.5–8.1)

## 2024-02-26 LAB — PROTEIN / CREATININE RATIO, URINE
Creatinine, Urine: 34 mg/dL
Total Protein, Urine: <6 mg/dL

## 2024-02-26 LAB — CBC
HCT: 36.7 % (ref 36.0–46.0)
Hemoglobin: 12.6 g/dL (ref 12.0–15.0)
MCH: 31 pg (ref 26.0–34.0)
MCHC: 34.3 g/dL (ref 30.0–36.0)
MCV: 90.2 fL (ref 80.0–100.0)
Platelets: 206 10*3/uL (ref 150–400)
RBC: 4.07 MIL/uL (ref 3.87–5.11)
RDW: 13.2 % (ref 11.5–15.5)
WBC: 12.8 10*3/uL — ABNORMAL HIGH (ref 4.0–10.5)
nRBC: 0 % (ref 0.0–0.2)

## 2024-02-26 MED ORDER — CALCIUM CARBONATE ANTACID 500 MG PO CHEW: 2.0000 | CHEWABLE_TABLET | ORAL | Status: DC | PRN

## 2024-02-26 MED ORDER — ACETAMINOPHEN 325 MG PO TABS: 650.0000 mg | ORAL_TABLET | ORAL | Status: DC | PRN

## 2024-02-26 NOTE — Discharge Summary (Signed)
 April Fleming is a 23 y.o. female. She is at [redacted]w[redacted]d gestation. Patient's last menstrual period was 06/26/2023. Estimated Date of Delivery: 03/30/24  Prenatal care site: [facility]   Current pregnancy complicated by:  - hx of infertility, conceived with letrozole - hx of ASD - anxiety  Chief complaint: sent from the office for elevated BP  She was seen in the office today for a Tdap vaccine and her BP was 140s/90s, remaining 140s/90s on recheck. She denies any headaches, changes of vision, and/or RUQ pain.  S: Resting comfortably. no CTX, no VB.no LOF,  Active fetal movement.  Denies: HA, visual changes, SOB, or RUQ/epigastric pain  Maternal Medical History:   Past Medical History:  Diagnosis Date   Abdominal pain    ASD (atrial septal defect)    Costochondritis    COVID-19 12/23/2020   Dysplastic nevus 11/08/2021   mild, R shoulder, recheck at followup   Family history of malignant melanoma    father, deceased   IBS (irritable bowel syndrome)    Irregular periods/menstrual cycles    Migraine headache without aura    Vomiting     Past Surgical History:  Procedure Laterality Date   WISDOM TOOTH EXTRACTION      No Known Allergies  Prior to Admission medications   Medication Sig Start Date End Date Taking? Authorizing Provider  calcium carbonate (TUMS EX) 750 MG chewable tablet Chew 1 tablet by mouth as needed for heartburn.   Yes [provider]  acetaminophen (TYLENOL) 500 MG tablet Take 500 mg by mouth every 6 (six) hours as needed. Patient not taking: Reported on 11/06/2023    [provider]  acetaminophen (TYLENOL) 500 MG tablet Take 2 tablets (1,000 mg total) by mouth every 6 (six) hours as needed. 02/12/24 02/11/25  Chari Manning, CNM  magnesium oxide (MAG-OX) 400 MG tablet Take 1 tablet (400 mg total) by mouth daily. 02/12/24   Chari Manning, CNM  Prenatal Vit-Fe Fumarate-FA (PRENATAL MULTIVITAMIN) TABS tablet Take 1 tablet  by mouth daily at 12 noon.    [provider]  Riboflavin 400 MG TABS Take 400 mg by mouth 2 (two) times daily. Patient not taking: Reported on 02/26/2024 02/12/24   Chari Manning, CNM      Social History: She  reports that she has never smoked. She has never used smokeless tobacco. She reports that she does not currently use alcohol. She reports that she does not use drugs.  Family History: family history includes COPD in her mother; Dementia in her paternal grandmother; Healthy in her maternal grandfather and other family members; Hypertension in her maternal grandmother and mother; Intellectual disability in her maternal grandfather; Melanoma in her maternal grandmother; Melanoma (age of onset: 63) in her father; Peptic Ulcer in her maternal grandmother and mother.  no history of gyn cancers  Review of Systems: A full review of systems was performed and negative except as noted in the HPI.     O:  BP 131/73   Pulse 100   Temp 97.9 F (36.6 C) (Oral)   Resp 16   Ht 5\' 3"  (1.6 m)   Wt 72.6 kg   LMP 06/26/2023   BMI 28.34 kg/m  Vitals:   02/26/24 0954 02/26/24 1008 02/26/24 1023 02/26/24 1038  BP: 133/83 126/82 134/72 117/81   02/26/24 1053 02/26/24 1108 02/26/24 1123  BP: 110/80 119/84 131/73    Results for orders placed or performed during the hospital encounter of 02/26/24 (from the past  48 hours)  Protein / creatinine ratio, urine   Collection Time: 02/26/24 10:12 AM  Result Value Ref Range   Creatinine, Urine 34 mg/dL   Total Protein, Urine <6 mg/dL   Protein Creatinine Ratio        0.00 - 0.15 mg/mg[Cre]  Comprehensive metabolic panel   Collection Time: 02/26/24 10:27 AM  Result Value Ref Range   Sodium 133 (L) 135 - 145 mmol/L   Potassium 3.8 3.5 - 5.1 mmol/L   Chloride 106 98 - 111 mmol/L   CO2 19 (L) 22 - 32 mmol/L   Glucose, Bld 70 70 - 99 mg/dL   BUN 8 6 - 20 mg/dL   Creatinine, Ser 1.61 (L) 0.44 - 1.00 mg/dL   Calcium 9.1 8.9 - 09.6 mg/dL    Total Protein 6.6 6.5 - 8.1 g/dL   Albumin 3.0 (L) 3.5 - 5.0 g/dL   AST 19 15 - 41 U/L   ALT 13 0 - 44 U/L   Alkaline Phosphatase 106 38 - 126 U/L   Total Bilirubin 0.6 0.0 - 1.2 mg/dL   GFR, Estimated >04 >54 mL/min   Anion gap 8 5 - 15  CBC   Collection Time: 02/26/24 10:27 AM  Result Value Ref Range   WBC 12.8 (H) 4.0 - 10.5 K/uL   RBC 4.07 3.87 - 5.11 MIL/uL   Hemoglobin 12.6 12.0 - 15.0 g/dL   HCT 09.8 11.9 - 14.7 %   MCV 90.2 80.0 - 100.0 fL   MCH 31.0 26.0 - 34.0 pg   MCHC 34.3 30.0 - 36.0 g/dL   RDW 82.9 56.2 - 13.0 %   Platelets 206 150 - 400 K/uL   nRBC 0.0 0.0 - 0.2 %     Constitutional: NAD, AAOx3  HE/ENT: extraocular movements grossly intact, moist mucous membranes CV: RRR PULM: nl respiratory effort, CTABL     Abd: gravid, non-tender, non-distended, soft      Ext: Non-tender, Nonedematous   Psych: mood appropriate, speech normal Pelvic: deferred  Fetal  monitoring: Cat 1 Appropriate for GA Baseline: 135bpm Variability: moderate Accelerations: present x >2 Decelerations absent Time 2 hours  A/P: 23 y.o. [redacted]w[redacted]d here for antenatal surveillance for elevated BP in the office  Principle Diagnosis: Elevated BP without diagnosis of HTN  Pre-eclampsia: not present. Normal labs, normal BP. Check BP 3x/wk, if BP >140/90 or developing PIH symptoms, return for evaluation.  Fetal Wellbeing: Reassuring Cat 1 tracing. Reactive NST  D/c home stable, precautions reviewed, follow-up as scheduled.    Janyce Llanos, CNM 02/26/2024 11:38 AM

## 2024-02-26 NOTE — OB Triage Note (Signed)
 Patient sent from [facility] office for further testing due to elevated blood pressures in office.

## 2024-03-04 LAB — OB RESULTS CONSOLE GC/CHLAMYDIA: Neisseria Gonorrhea: NEGATIVE

## 2024-03-04 LAB — OB RESULTS CONSOLE GBS: GBS: NEGATIVE

## 2024-03-06 LAB — OB RESULTS CONSOLE GC/CHLAMYDIA: Chlamydia: NEGATIVE

## 2024-03-11 ENCOUNTER — Encounter: Payer: Self-pay | Admitting: Obstetrics and Gynecology

## 2024-03-11 ENCOUNTER — Observation Stay
Admission: EM | Admit: 2024-03-11 | Discharge: 2024-03-11 | Disposition: A | Discharge status: Home / Self Care | Source: Home / Self Care | Admitting: Obstetrics and Gynecology

## 2024-03-11 ENCOUNTER — Other Ambulatory Visit: Payer: Self-pay

## 2024-03-11 DIAGNOSIS — Z3A37 37 weeks gestation of pregnancy: Secondary | ICD-10-CM | POA: Insufficient documentation

## 2024-03-11 DIAGNOSIS — O163 Unspecified maternal hypertension, third trimester: Principal | ICD-10-CM | POA: Diagnosis present

## 2024-03-11 DIAGNOSIS — Z8616 Personal history of COVID-19: Secondary | ICD-10-CM | POA: Insufficient documentation

## 2024-03-11 DIAGNOSIS — Z79899 Other long term (current) drug therapy: Secondary | ICD-10-CM | POA: Insufficient documentation

## 2024-03-11 LAB — COMPREHENSIVE METABOLIC PANEL
ALT: 13 U/L (ref 0–44)
AST: 21 U/L (ref 15–41)
Albumin: 2.8 g/dL — ABNORMAL LOW (ref 3.5–5.0)
Alkaline Phosphatase: 118 U/L (ref 38–126)
Anion gap: 5 (ref 5–15)
BUN: 8 mg/dL (ref 6–20)
CO2: 20 mmol/L — ABNORMAL LOW (ref 22–32)
Calcium: 9 mg/dL (ref 8.9–10.3)
Chloride: 107 mmol/L (ref 98–111)
Creatinine, Ser: 0.34 mg/dL — ABNORMAL LOW (ref 0.44–1.00)
GFR, Estimated: >60 mL/min (ref >60)
Glucose, Bld: 65 mg/dL — ABNORMAL LOW (ref 70–99)
Potassium: 3.9 mmol/L (ref 3.5–5.1)
Sodium: 132 mmol/L — ABNORMAL LOW (ref 135–145)
Total Bilirubin: 0.4 mg/dL (ref 0.0–1.2)
Total Protein: 6.6 g/dL (ref 6.5–8.1)

## 2024-03-11 LAB — CBC WITH DIFFERENTIAL/PLATELET
Abs Immature Granulocytes: 0.18 10*3/uL — ABNORMAL HIGH (ref 0.00–0.07)
Basophils Absolute: 0.1 10*3/uL (ref 0.0–0.1)
Basophils Relative: 1 %
Eosinophils Absolute: 0.2 10*3/uL (ref 0.0–0.5)
Eosinophils Relative: 2 %
HCT: 35.5 % — ABNORMAL LOW (ref 36.0–46.0)
Hemoglobin: 12.3 g/dL (ref 12.0–15.0)
Immature Granulocytes: 1 %
Lymphocytes Relative: 16 %
Lymphs Abs: 2 10*3/uL (ref 0.7–4.0)
MCH: 30.9 pg (ref 26.0–34.0)
MCHC: 34.6 g/dL (ref 30.0–36.0)
MCV: 89.2 fL (ref 80.0–100.0)
Monocytes Absolute: 1 10*3/uL (ref 0.1–1.0)
Monocytes Relative: 8 %
Neutro Abs: 9.2 10*3/uL — ABNORMAL HIGH (ref 1.7–7.7)
Neutrophils Relative %: 72 %
Platelets: 223 10*3/uL (ref 150–400)
RBC: 3.98 MIL/uL (ref 3.87–5.11)
RDW: 13.5 % (ref 11.5–15.5)
WBC: 12.6 10*3/uL — ABNORMAL HIGH (ref 4.0–10.5)
nRBC: 0 % (ref 0.0–0.2)

## 2024-03-11 LAB — PROTEIN / CREATININE RATIO, URINE
Creatinine, Urine: 16 mg/dL
Total Protein, Urine: <6 mg/dL

## 2024-03-11 MED ORDER — ACETAMINOPHEN 500 MG PO TABS: 1000.0000 mg | ORAL_TABLET | Freq: Once | ORAL | Status: DC

## 2024-03-11 NOTE — Discharge Summary (Addendum)
 April Fleming is a 23 y.o. female. She is at 105w2d gestation. Patient's last menstrual period was 06/26/2023. Estimated Date of Delivery: 03/30/24  Prenatal care site: [facility]  Chief complaint: Elevated Blood Pressure Reading   HPI: April Fleming was sent to L&D triage from [facility] today (03/11/2024) for an elevated blood pressure reading in office of 171 (systolic). She reports that the BP was not re-checked and she was not seen by an OB provider. Patient reports swelling of hands and feet. Denies RUQ pain, headache, and visual disturbances. Reports mild abdominal cramping. Denies vaginal bleeding, leakage of fluid, and contractions. Reports active fetal movement.    Factors complicating pregnancy: H/o infertility- pregnancy achieved with letrozole/FSH/hcg/IUI H/o ASD and heart murmur Anxiety  S: Resting comfortably. Occasional contractions. No vaginal bleeding and no leakage of fluid. Endorses active fetal movement.   Maternal Medical History:  Past Medical Hx:  has a past medical history of Abdominal pain, ASD (atrial septal defect), Costochondritis, COVID-19 (12/23/2020), Dysplastic nevus (11/08/2021), Family history of malignant melanoma, IBS (irritable bowel syndrome), Irregular periods/menstrual cycles, Migraine headache without aura, and Vomiting.    Past Surgical Hx:  has a past surgical history that includes Wisdom tooth extraction.   No Known Allergies   Prior to Admission medications   Medication Sig Start Date End Date Taking? Authorizing Provider  magnesium oxide (MAG-OX) 400 MG tablet Take 1 tablet (400 mg total) by mouth daily. 02/12/24  Yes Chari Manning, CNM  Prenatal Vit-Fe Fumarate-FA (PRENATAL MULTIVITAMIN) TABS tablet Take 1 tablet by mouth daily at 12 noon.   Yes [provider]  acetaminophen (TYLENOL) 500 MG tablet Take 500 mg by mouth every 6 (six) hours as needed. Patient not taking: Reported on 11/06/2023    [provider]   acetaminophen (TYLENOL) 500 MG tablet Take 2 tablets (1,000 mg total) by mouth every 6 (six) hours as needed. 02/12/24 02/11/25  Chari Manning, CNM  calcium carbonate (TUMS EX) 750 MG chewable tablet Chew 1 tablet by mouth as needed for heartburn.    [provider]  Riboflavin 400 MG TABS Take 400 mg by mouth 2 (two) times daily. Patient not taking: Reported on 02/26/2024 02/12/24   Chari Manning, CNM    Social History: She  reports that she has never smoked. She has never used smokeless tobacco. She reports that she does not currently use alcohol. She reports that she does not use drugs.  Family History: family history includes COPD in her mother; Dementia in her paternal grandmother; Healthy in her maternal grandfather and other family members; Hypertension in her maternal grandmother and mother; Intellectual disability in her maternal grandfather; Melanoma in her maternal grandmother; Melanoma (age of onset: 95) in her father; Peptic Ulcer in her maternal grandmother and mother. No history of gyn cancers  Review of Systems:  Review of Systems  Constitutional: Negative.   Eyes:  Negative for blurred vision and double vision.  Cardiovascular:  Negative for leg swelling (reports hands and pedal swelling).  Gastrointestinal:  Negative for abdominal pain.  Genitourinary: Negative.   Musculoskeletal: Negative.   Neurological:  Negative for headaches.  Psychiatric/Behavioral: Negative.      O:  BP 124/71   Pulse 89   Temp 98.2 F (36.8 C) (Oral)   Resp 16   LMP 06/26/2023  Results for orders placed or performed during the hospital encounter of 03/11/24 (from the past 48 hours)  Protein / creatinine ratio, urine   Collection Time: 03/11/24 11:03 AM  Result  Value Ref Range   Creatinine, Urine 16 mg/dL   Total Protein, Urine <6 mg/dL   Protein Creatinine Ratio        0.00 - 0.15 mg/mg[Cre]  CBC with Differential/Platelet   Collection Time: 03/11/24 11:34 AM  Result  Value Ref Range   WBC 12.6 (H) 4.0 - 10.5 K/uL   RBC 3.98 3.87 - 5.11 MIL/uL   Hemoglobin 12.3 12.0 - 15.0 g/dL   HCT 87.5 (L) 64.3 - 32.9 %   MCV 89.2 80.0 - 100.0 fL   MCH 30.9 26.0 - 34.0 pg   MCHC 34.6 30.0 - 36.0 g/dL   RDW 51.8 84.1 - 66.0 %   Platelets 223 150 - 400 K/uL   nRBC 0.0 0.0 - 0.2 %   Neutrophils Relative % 72 %   Neutro Abs 9.2 (H) 1.7 - 7.7 K/uL   Lymphocytes Relative 16 %   Lymphs Abs 2.0 0.7 - 4.0 K/uL   Monocytes Relative 8 %   Monocytes Absolute 1.0 0.1 - 1.0 K/uL   Eosinophils Relative 2 %   Eosinophils Absolute 0.2 0.0 - 0.5 K/uL   Basophils Relative 1 %   Basophils Absolute 0.1 0.0 - 0.1 K/uL   Immature Granulocytes 1 %   Abs Immature Granulocytes 0.18 (H) 0.00 - 0.07 K/uL  Comprehensive metabolic panel   Collection Time: 03/11/24 11:34 AM  Result Value Ref Range   Sodium 132 (L) 135 - 145 mmol/L   Potassium 3.9 3.5 - 5.1 mmol/L   Chloride 107 98 - 111 mmol/L   CO2 20 (L) 22 - 32 mmol/L   Glucose, Bld 65 (L) 70 - 99 mg/dL   BUN 8 6 - 20 mg/dL   Creatinine, Ser 6.30 (L) 0.44 - 1.00 mg/dL   Calcium 9.0 8.9 - 16.0 mg/dL   Total Protein 6.6 6.5 - 8.1 g/dL   Albumin 2.8 (L) 3.5 - 5.0 g/dL   AST 21 15 - 41 U/L   ALT 13 0 - 44 U/L   Alkaline Phosphatase 118 38 - 126 U/L   Total Bilirubin 0.4 0.0 - 1.2 mg/dL   GFR, Estimated >10 >93 mL/min   Anion gap 5 5 - 15     Constitutional: NAD, AAOx3  HE/ENT: extraocular movements grossly intact, moist mucous membranes CV: RRR PULM: nl respiratory effort, CTABL Abd: gravid, non-tender, non-distended, soft  Ext: Non-tender, Nonedmeatous leg swelling. Mild hand and pedal swelling noted.  Psych: mood appropriate, speech normal Pelvic : Deferred SVE: Dilation: 1 Effacement (%): 40 Station: Ballotable Presentation: Vertex Exam by:: April [facility], CNM   NST:  Baseline: 135 bpm Variability: moderate Accels: Present Decels: none Toco: irregular, every 1-10 minutes Category: I Interpretation:  INDICATIONS:  patient reassurance and rule out uterine contractions RESULTS:  A NST procedure was performed with FHR monitoring and a normal baseline established, appropriate time of 20-40 minutes of evaluation, and accels >2 seen w 15x15 characteristics.  Results show a REACTIVE NST.   Assessment: 23 y.o. [redacted]w[redacted]d here for antenatal surveillance during pregnancy.  Principle diagnosis: Elevated Blood Pressure Reading    Plan: Antenatal surveillance  Labor: Not present. [place] and TOCO placed on abdomen. Abdomen soft and nontender. Patient reports feeling a range of moderate to severe contractions. Irregular contractions observed q1-10 mins. SVE: 1/40/ballotable.  Patient advised to rest, increase fluid intake, and empty bladder frequently.  Labor precautions reviewed with patient. Patient verbalized understanding.  Fetal Wellbeing: Reassuring Cat 1 tracing. Reactive NST   2. Elevated Blood Pressure Reading  CMP, CBC, and P/C ratio collected; Results: WNL   - CMP; Results as follows:    - Creatinine, Ser: 0.34    - AST/ALT: 21/13   - CBC; Results as follows:    - Hgb: 12.3    - WBC: 12.6    - Platelets: 223    - P/C ratio; Results: Result below reportable range  Blood pressures cycled q15 mins x 1 hour    - 1 elevate BP noted--> 147/92  - Rest of  BP's normotensive; BP ranged from 124-134/71-77 Patient denies signs and symptoms of Pre-E. Patient advised to take blood pressures twice a day at home and to bring log in for review at next routine prenatal visit. Strict return precaution given for patient to notify OB provider or seek care at L&D triage if blood pressure is greater than 140/90 or 160/100 at home.  Patient stated she hass an at-home blood pressure cuff and verbalized understanding.  Blood pressure recording log provided in AVS.  Patient to be scheduled for BP check on 03/24 and instructed to bring BP log to visit.   - D/c home stable, strict return precautions reviewed, follow-up as needed.    ----- Roney Jaffe, CNM Certified Nurse Midwife [facility]

## 2024-03-11 NOTE — OB Triage Note (Signed)
 Pt being discharged home stable, ambulatory, and with mother-in law. Discharge instructions reviewed and pt verbalized understanding. Signs/symptoms of labor and Pre-E discussed w patient. Discharged by [place], CNM.

## 2024-03-11 NOTE — Discharge Instructions (Signed)
 Please take your blood pressure twice a day (once in AM and once in PM) and bring log for review at next routine prenatal visit.   Notify your OB provider or seek care at L&D triage if BP is consistently GREATER THAN 140/90 or at all 160/100.

## 2024-03-11 NOTE — OB Triage Note (Signed)
 23 y.o G1P0 presents to L&D triage at [redacted]w[redacted]d after being sent from [facility] for an elevated BP reading in the office of 171 systolic. She reports that the BP wasn't taken twice and she wasn't seen at the office by a provider. Pt admits swelling of the hands and feet but denies epigastric pain, headache, vision disturbances. She admits abdominal cramping and ctx's are seen on the monitor. Pt's BP's cycling Q15 and fetal monitors applied and assessing. Labs are being processed. [place] CNM aware of pt arrival.

## 2024-03-12 ENCOUNTER — Encounter: Payer: Self-pay | Admitting: Obstetrics and Gynecology

## 2024-03-12 ENCOUNTER — Inpatient Hospital Stay
Admission: EM | Admit: 2024-03-12 | Discharge: 2024-03-15 | DRG: 806 | Disposition: A | Discharge status: Home / Self Care | Attending: Certified Nurse Midwife | Admitting: Certified Nurse Midwife

## 2024-03-12 DIAGNOSIS — Z8616 Personal history of COVID-19: Secondary | ICD-10-CM

## 2024-03-12 DIAGNOSIS — O134 Gestational [pregnancy-induced] hypertension without significant proteinuria, complicating childbirth: Secondary | ICD-10-CM | POA: Diagnosis present

## 2024-03-12 DIAGNOSIS — O139 Gestational [pregnancy-induced] hypertension without significant proteinuria, unspecified trimester: Principal | ICD-10-CM | POA: Diagnosis present

## 2024-03-12 DIAGNOSIS — Z8249 Family history of ischemic heart disease and other diseases of the circulatory system: Secondary | ICD-10-CM | POA: Diagnosis not present

## 2024-03-12 DIAGNOSIS — Q211 Atrial septal defect, unspecified: Secondary | ICD-10-CM

## 2024-03-12 DIAGNOSIS — Z3A37 37 weeks gestation of pregnancy: Secondary | ICD-10-CM | POA: Diagnosis not present

## 2024-03-12 LAB — TYPE AND SCREEN
ABO/RH(D): A POS
Antibody Screen: NEGATIVE

## 2024-03-12 MED ORDER — MISOPROSTOL 200 MCG PO TABS
ORAL_TABLET | ORAL | Status: AC
  Filled 2024-03-12: qty 4

## 2024-03-12 MED ORDER — OXYTOCIN-SODIUM CHLORIDE 30-0.9 UT/500ML-% IV SOLN
1.0000 m[IU]/min | INTRAVENOUS | Status: DC
  Administered 2024-03-13: 2 m[IU]/min via INTRAVENOUS
  Filled 2024-03-12: qty 500

## 2024-03-12 MED ORDER — LACTATED RINGERS IV SOLN: INTRAVENOUS | Status: AC

## 2024-03-12 MED ORDER — LABETALOL HCL 5 MG/ML IV SOLN: 20.0000 mg | INTRAVENOUS | Status: DC | PRN

## 2024-03-12 MED ORDER — OXYTOCIN 10 UNIT/ML IJ SOLN
INTRAMUSCULAR | Status: DC
Start: 2024-03-12 — End: 2024-03-13
  Filled 2024-03-12: qty 2

## 2024-03-12 MED ORDER — FENTANYL CITRATE (PF) 100 MCG/2ML IJ SOLN: 50.0000 ug | INTRAMUSCULAR | Status: DC | PRN

## 2024-03-12 MED ORDER — LABETALOL HCL 5 MG/ML IV SOLN: 40.0000 mg | INTRAVENOUS | Status: DC | PRN

## 2024-03-12 MED ORDER — OXYTOCIN BOLUS FROM INFUSION
333.0000 mL | Freq: Once | INTRAVENOUS | Status: AC
  Administered 2024-03-13: 333 mL via INTRAVENOUS

## 2024-03-12 MED ORDER — TERBUTALINE SULFATE 1 MG/ML IJ SOLN: 0.2500 mg | Freq: Once | INTRAMUSCULAR | Status: DC | PRN

## 2024-03-12 MED ORDER — LIDOCAINE HCL (PF) 1 % IJ SOLN
INTRAMUSCULAR | Status: DC
Start: 2024-03-12 — End: 2024-03-13
  Filled 2024-03-12: qty 30

## 2024-03-12 MED ORDER — LABETALOL HCL 5 MG/ML IV SOLN: 80.0000 mg | INTRAVENOUS | Status: DC | PRN

## 2024-03-12 MED ORDER — LIDOCAINE HCL (PF) 1 % IJ SOLN: 30.0000 mL | INTRAMUSCULAR | Status: DC | PRN

## 2024-03-12 MED ORDER — ACETAMINOPHEN 325 MG PO TABS
650.0000 mg | ORAL_TABLET | ORAL | Status: DC | PRN
  Filled 2024-03-12 (×2): qty 2

## 2024-03-12 MED ORDER — AMMONIA AROMATIC IN INHA
RESPIRATORY_TRACT | Status: AC
  Filled 2024-03-12: qty 10

## 2024-03-12 MED ORDER — SOD CITRATE-CITRIC ACID 500-334 MG/5ML PO SOLN: 30.0000 mL | ORAL | Status: DC | PRN

## 2024-03-12 MED ORDER — OXYTOCIN-SODIUM CHLORIDE 30-0.9 UT/500ML-% IV SOLN
2.5000 [IU]/h | INTRAVENOUS | Status: DC
  Administered 2024-03-13: 2.5 [IU]/h via INTRAVENOUS

## 2024-03-12 MED ORDER — HYDRALAZINE HCL 20 MG/ML IJ SOLN: 10.0000 mg | INTRAMUSCULAR | Status: DC | PRN

## 2024-03-12 MED ORDER — LACTATED RINGERS IV SOLN
500.0000 mL | INTRAVENOUS | Status: AC | PRN
Start: 2024-03-12 — End: 2024-03-13

## 2024-03-12 MED ORDER — MISOPROSTOL 25 MCG QUARTER TABLET
25.0000 ug | ORAL_TABLET | ORAL | Status: AC
  Administered 2024-03-12 (×2): 25 ug via ORAL
  Filled 2024-03-12 (×3): qty 1

## 2024-03-12 MED ORDER — ONDANSETRON HCL 4 MG/2ML IJ SOLN
4.0000 mg | Freq: Four times a day (QID) | INTRAMUSCULAR | Status: DC | PRN
  Administered 2024-03-13 (×2): 4 mg via INTRAVENOUS
  Filled 2024-03-12 (×2): qty 2

## 2024-03-12 MED ORDER — MISOPROSTOL 25 MCG QUARTER TABLET
25.0000 ug | ORAL_TABLET | ORAL | Status: AC
  Administered 2024-03-12 (×2): 25 ug via VAGINAL
  Filled 2024-03-12 (×2): qty 1

## 2024-03-12 NOTE — H&P (Signed)
 OB History & Physical   History of Present Illness:  Chief Complaint:   HPI:  April Fleming is a 23 y.o. G1P0000 female at [redacted]w[redacted]d dated by LMP.  She presents to L&D for IOL for GHTN.  She reports:  -active fetal movement -no leakage of fluid -no vaginal bleeding -no contractions  Pregnancy Issues: Hx infertility - IUI with [facility]  Hx of ASD and heart murmur  Anxiety: mood stable    Maternal Medical History:   Past Medical History:  Diagnosis Date   Abdominal pain    ASD (atrial septal defect)    Costochondritis    COVID-19 12/23/2020   Dysplastic nevus 11/08/2021   mild, R shoulder, recheck at followup   Family history of malignant melanoma    father, deceased   IBS (irritable bowel syndrome)    Irregular periods/menstrual cycles    Migraine headache without aura    Vomiting     Past Surgical History:  Procedure Laterality Date   WISDOM TOOTH EXTRACTION      Allergies  Allergen Reactions   Hydrocodone Nausea Only    Prior to Admission medications   Medication Sig Start Date End Date Taking? Authorizing Provider  magnesium oxide (MAG-OX) 400 MG tablet Take 1 tablet (400 mg total) by mouth daily. 02/12/24  Yes Chari Manning, CNM  Prenatal Vit-Fe Fumarate-FA (PRENATAL MULTIVITAMIN) TABS tablet Take 1 tablet by mouth daily at 12 noon.   Yes [provider]  acetaminophen (TYLENOL) 500 MG tablet Take 2 tablets (1,000 mg total) by mouth every 6 (six) hours as needed. 02/12/24 02/11/25  Chari Manning, CNM  calcium carbonate (TUMS EX) 750 MG chewable tablet Chew 1 tablet by mouth as needed for heartburn.    [provider]     Prenatal care site: [facility]   Social History: She  reports that she has never smoked. She has never used smokeless tobacco. She reports that she does not currently use alcohol. She reports that she does not use drugs.  Family History: family history includes COPD in her mother; Dementia in  her paternal grandmother; Healthy in her maternal grandfather and other family members; Hypertension in her maternal grandmother and mother; Intellectual disability in her maternal grandfather; Melanoma in her maternal grandmother; Melanoma (age of onset: 67) in her father; Peptic Ulcer in her maternal grandmother and mother.   Review of Systems: A full review of systems was performed and negative except as noted in the HPI.    Physical Exam:  Vital Signs: BP (!) 150/83   Pulse (!) 114   Temp 98 F (36.7 C) (Oral)   Resp 16   LMP 06/26/2023   General:   alert and cooperative  Skin:  normal  Neurologic:    Alert & oriented x 3  Lungs:    Nl effort  Heart:   regular rate and rhythm  Abdomen:  soft, non-tender; bowel sounds normal; no masses,  no organomegaly  Extremities: : non-tender, symmetric, Mod LE edema bilaterally.      EFW:    2,792  g       41%     6 lb 2  oz     Pertinent Results:  Prenatal Labs: Blood type/Rh A pos  Antibody screen neg  Rubella Immune  Varicella Immune  RPR NR  HBsAg Neg  HIV NR  GC neg  Chlamydia neg  Genetic screening negative  1 hour GTT 129  3 hour GTT   GBS neg  FHT: FHR: 130 bpm, variability: moderate,  accelerations:  Present,  decelerations:  Absent Category/reactivity:  Category I TOCO: none SVE:   /   /   to be done by RN    Assessment:  April Fleming is a 23 y.o. G1P0000 female at [redacted]w[redacted]d with GHTN.   Plan:  1. Admit to Labor & Delivery; consents reviewed and obtained  2. Fetal Well being  - Fetal Tracing: Cat I - GBS neg - Presentation: vtx confirmed by sve   3. Routine OB: - Prenatal labs reviewed, as above - Rh pos - CBC & T&S on admit - Clear fluids, IVF  4. Induction of Labor -  Contractions by external toco in place -  Plan for induction with cytotec -  Plan for continuous fetal monitoring  -  Maternal pain control as desired: IVPM, nitrous, regional anesthesia - Anticipate vaginal delivery  5. Post  Partum Planning: - Infant feeding: TBD - Contraception: TBD - Tdap: given 02/26/24  - Flu: declined 10/02/23  - RSV: was not in season  [place], CNM 03/12/2024 2:15 PM

## 2024-03-12 NOTE — OB Triage Note (Signed)
 23 y.o G1P0 presents to L&D triage c/o elevated bp's at home. She states that her BP was 133/83 when she woke up, 146/92 when leaving the house for work, and then 155/97 about an hour later while at work. She admits nausea and cramping/ctx's, but denies epigastric pain, headache, vision changes. Pt reports normal swelling in feet. She was seen here yesterday and has been keeping a BP log at home. BP's cycling Q15, fetal monitors applied and assessing. [place] CNM aware of pt arrival.

## 2024-03-12 NOTE — Progress Notes (Signed)
 Patient doing well currently. Pain reasonably well controlled early in the induction process.    Blood pressure normal to mild range.  Preeclampsia labs normal with no evidence of severe features.  Lab Results  Component Value Date   WBC 12.6 (H) 03/11/2024   HGB 12.3 03/11/2024   HCT 35.5 (L) 03/11/2024   PLT 223 03/11/2024   CREATININE 0.34 (L) 03/11/2024   ALT 13 03/11/2024   AST 21 03/11/2024   PROTCRRATIO        03/11/2024     Continue current plan of care.  Discussed with patient and family. All questions answered  at this time.  Thomasene Mohair, MD, [facility] 03/12/2024 9:03 PM

## 2024-03-13 ENCOUNTER — Inpatient Hospital Stay: Admitting: Anesthesiology

## 2024-03-13 LAB — RPR: RPR Ser Ql: NONREACTIVE

## 2024-03-13 MED ORDER — BENZOCAINE-MENTHOL 20-0.5 % EX AERO
1.0000 | INHALATION_SPRAY | CUTANEOUS | Status: DC | PRN
  Filled 2024-03-13: qty 56

## 2024-03-13 MED ORDER — BENZOCAINE-MENTHOL 20-0.5 % EX AERO
INHALATION_SPRAY | CUTANEOUS | Status: AC
  Administered 2024-03-13: 1 via TOPICAL
  Filled 2024-03-13: qty 56

## 2024-03-13 MED ORDER — LACTATED RINGERS IV SOLN
500.0000 mL | Freq: Once | INTRAVENOUS | Status: AC
  Administered 2024-03-13: 500 mL via INTRAVENOUS

## 2024-03-13 MED ORDER — LIDOCAINE-EPINEPHRINE (PF) 1.5 %-1:200000 IJ SOLN
INTRAMUSCULAR | Status: DC | PRN
  Administered 2024-03-13: 3 mL via EPIDURAL

## 2024-03-13 MED ORDER — EPHEDRINE 5 MG/ML INJ: 10.0000 mg | INTRAVENOUS | Status: DC | PRN

## 2024-03-13 MED ORDER — WITCH HAZEL-GLYCERIN EX PADS
1.0000 | MEDICATED_PAD | CUTANEOUS | Status: DC | PRN
  Administered 2024-03-14: 1 via TOPICAL
  Filled 2024-03-13: qty 200
  Filled 2024-03-13: qty 100

## 2024-03-13 MED ORDER — LIDOCAINE HCL (PF) 1 % IJ SOLN
INTRAMUSCULAR | Status: DC | PRN
  Administered 2024-03-13: 2 mL

## 2024-03-13 MED ORDER — FENTANYL-BUPIVACAINE-NACL 0.5-0.125-0.9 MG/250ML-% EP SOLN
12.0000 mL/h | EPIDURAL | Status: DC | PRN
  Administered 2024-03-13: 12 mL/h via EPIDURAL

## 2024-03-13 MED ORDER — PHENYLEPHRINE 80 MCG/ML (10ML) SYRINGE FOR IV PUSH (FOR BLOOD PRESSURE SUPPORT): 80.0000 ug | PREFILLED_SYRINGE | INTRAVENOUS | Status: DC | PRN

## 2024-03-13 MED ORDER — FENTANYL-BUPIVACAINE-NACL 0.5-0.125-0.9 MG/250ML-% EP SOLN
EPIDURAL | Status: AC
  Filled 2024-03-13: qty 250

## 2024-03-13 MED ORDER — IBUPROFEN 600 MG PO TABS
ORAL_TABLET | ORAL | Status: AC
Start: 2024-03-13 — End: 2024-03-13
  Administered 2024-03-13: 600 mg via ORAL
  Filled 2024-03-13: qty 1

## 2024-03-13 MED ORDER — IBUPROFEN 600 MG PO TABS
600.0000 mg | ORAL_TABLET | Freq: Four times a day (QID) | ORAL | Status: DC
  Administered 2024-03-14 – 2024-03-15 (×6): 600 mg via ORAL
  Filled 2024-03-13 (×6): qty 1

## 2024-03-13 MED ORDER — DIBUCAINE (PERIANAL) 1 % EX OINT
1.0000 | TOPICAL_OINTMENT | CUTANEOUS | Status: DC | PRN
  Filled 2024-03-13 (×2): qty 28

## 2024-03-13 MED ORDER — ACETAMINOPHEN 325 MG PO TABS
650.0000 mg | ORAL_TABLET | ORAL | Status: DC | PRN
  Administered 2024-03-13 – 2024-03-15 (×4): 650 mg via ORAL
  Filled 2024-03-13 (×2): qty 2

## 2024-03-13 MED ORDER — DIPHENHYDRAMINE HCL 50 MG/ML IJ SOLN: 12.5000 mg | INTRAMUSCULAR | Status: DC | PRN

## 2024-03-13 NOTE — Anesthesia Procedure Notes (Signed)
 Epidural Patient location during procedure: OB Start time: 03/13/2024 1:28 PM End time: 03/13/2024 1:42 PM  Staffing Anesthesiologist: Foye Deer, MD Performed: anesthesiologist   Preanesthetic Checklist Completed: patient identified, IV checked, site marked, risks and benefits discussed, surgical consent, monitors and equipment checked, pre-op evaluation and timeout performed  Epidural Patient position: sitting Prep: ChloraPrep Patient monitoring: heart rate, continuous pulse ox and blood pressure Approach: midline Location: L3-L4 Injection technique: LOR saline  Needle:  Needle type: Tuohy  Needle gauge: 18 G Needle length: 9 cm Needle insertion depth: 7 cm Catheter type: closed end Catheter size: 20 Guage Catheter at skin depth: 12 cm Test dose: negative and 1.5% lidocaine with Epi 1:200 K  Assessment Events: blood not aspirated, no cerebrospinal fluid, injection not painful, no injection resistance and no paresthesia  Additional Notes Reason for block:procedure for pain

## 2024-03-13 NOTE — Discharge Summary (Signed)
 Postpartum Discharge Summary  Patient Name: April Fleming DOB: 11-20-2001 MRN: [id number]  Date of admission: 03/12/2024 Delivery date:03/13/2024 Delivering provider: Haroldine Laws Date of discharge: 03/15/2024  Primary OB: [facility] WUJ:[id number] last menstrual period was 06/26/2023. EDC Estimated Date of Delivery: 03/30/24 Gestational Age at Delivery: [redacted]w[redacted]d   Admitting diagnosis: Gestational hypertension [O13.9] Intrauterine pregnancy: [redacted]w[redacted]d     Secondary diagnosis:   Principal Problem:   NSVD (normal spontaneous vaginal delivery) Active Problems:   Gestational hypertension   Discharge Diagnosis: Term Pregnancy Delivered and Gestational Hypertension      Hospital course: Induction of Labor With Vaginal Delivery   23 y.o. yo G1P1001 at [redacted]w[redacted]d was admitted to the hospital 03/12/2024 for induction of labor.  Indication for induction: Gestational hypertension.  Patient had an labor course complicated by none Membrane Rupture Time/Date: 11:58 AM,03/13/2024  Delivery Method:Vaginal, Spontaneous Operative Delivery:N/A Episiotomy: None Lacerations:  None Details of delivery can be found in separate delivery note.  Patient had a postpartum course complicated by several elevated BP that improved after she rested. Patient is discharged home 03/15/24.  Newborn Data: Birth date:03/13/2024 Birth time:7:26 PM Gender:Female Living status:Living Apgars:8 ,9  Weight:2910 g                                            Post partum procedures: none Induction:: Pitocin and Cytotec Complications: None Delivery Type: spontaneous vaginal delivery Anesthesia: epidural anesthesia Placenta: spontaneous To Pathology: No   Prenatal Labs:  Blood type/Rh A pos  Antibody screen neg  Rubella Immune  Varicella Immune  RPR NR  HBsAg Neg  HIV NR  GC neg  Chlamydia neg  Genetic screening negative  1 hour GTT 129  3 hour GTT    GBS neg    Magnesium Sulfate received: No BMZ  received: No Rhophylac:was not indicated MMR: was not indicated Varivax vaccine given: was not indicated T-DaP:Given prenatally Flu:  declined prenatally  Transfusion:No  Physical exam  Vitals:   03/14/24 2240 03/14/24 2340 03/15/24 0300 03/15/24 0756  BP: 134/80 (!) 143/92 121/62 121/72  Pulse: 71 86 74 71  Resp: 18 18 18 17   Temp: 98.3 F (36.8 C) 98.2 F (36.8 C) 97.8 F (36.6 C) 98.2 F (36.8 C)  TempSrc: Oral Oral Oral Oral  SpO2: 100% 100% 98% 100%   General: alert, cooperative, and no distress Lochia: appropriate Uterine Fundus: firm Perineum: minimal edema/intact DVT Evaluation: No evidence of DVT seen on physical exam.  Labs: Lab Results  Component Value Date   WBC 15.3 (H) 03/14/2024   HGB 11.7 (L) 03/14/2024   HCT 34.4 (L) 03/14/2024   MCV 91.7 03/14/2024   PLT 203 03/14/2024      Latest Ref Rng & Units 03/11/2024   11:34 AM  CMP  Glucose 70 - 99 mg/dL 65   BUN 6 - 20 mg/dL 8   Creatinine 2.95 - 6.21 mg/dL 3.08   Sodium 657 - 846 mmol/L 132   Potassium 3.5 - 5.1 mmol/L 3.9   Chloride 98 - 111 mmol/L 107   CO2 22 - 32 mmol/L 20   Calcium 8.9 - 10.3 mg/dL 9.0   Total Protein 6.5 - 8.1 g/dL 6.6   Total Bilirubin 0.0 - 1.2 mg/dL 0.4   Alkaline Phos 38 - 126 U/L 118   AST 15 - 41 U/L 21   ALT 0 - 44  U/L 13    Edinburgh Score:    03/14/2024    8:15 AM  Edinburgh Postnatal Depression Scale Screening Tool  I have been able to laugh and see the funny side of things. 0  I have looked forward with enjoyment to things. 0  I have blamed myself unnecessarily when things went wrong. 0  I have been anxious or worried for no good reason. 1  I have felt scared or panicky for no good reason. 1  Things have been getting on top of me. 1  I have been so unhappy that I have had difficulty sleeping. 0  I have felt sad or miserable. 0  I have been so unhappy that I have been crying. 1  The thought of harming myself has occurred to me. 0  Edinburgh Postnatal  Depression Scale Total 4    Risk assessment for postpartum VTE and prophylactic treatment: Very high risk factors: None High risk factors: None Moderate risk factors: None  Postpartum VTE prophylaxis with LMWH not indicated  After visit meds:  Allergies as of 03/15/2024       Reactions   Hydrocodone Nausea Only        Medication List     STOP taking these medications    calcium carbonate 750 MG chewable tablet Commonly known as: TUMS EX   magnesium oxide 400 MG tablet Commonly known as: MAG-OX       TAKE these medications    acetaminophen 325 MG tablet Commonly known as: Tylenol Take 2 tablets (650 mg total) by mouth every 6 (six) hours as needed for fever or mild pain (pain score 1-3) (for pain scale < 4). What changed:  medication strength how much to take reasons to take this   benzocaine-Menthol 20-0.5 % Aero Commonly known as: DERMOPLAST Apply 1 Application topically as needed for irritation (perineal discomfort).   ferrous sulfate 325 (65 FE) MG tablet Take 1 tablet (325 mg total) by mouth 2 (two) times daily with a meal.   ibuprofen 600 MG tablet Commonly known as: ADVIL Take 1 tablet (600 mg total) by mouth every 6 (six) hours as needed for fever, mild pain (pain score 1-3) or cramping.   prenatal multivitamin Tabs tablet Take 1 tablet by mouth daily at 12 noon.   senna-docusate 8.6-50 MG tablet Commonly known as: Senokot-S Take 2 tablets by mouth at bedtime as needed for mild constipation.   witch hazel-glycerin pad Commonly known as: TUCKS Apply 1 Application topically as needed for hemorrhoids.       Discharge home in stable condition Infant Feeding: Breast Infant Disposition:home with mother Discharge instruction: per After Visit Summary and Postpartum booklet. Activity: Advance as tolerated. Pelvic rest for 6 weeks.  Diet: routine diet Anticipated Birth Control: Unsure Postpartum Appointment:6 weeks Additional Postpartum F/U:   none Future Appointments: Future Appointments  Date Time Provider Department Center  04/14/2024  1:00 PM [facility] [place] 2 [facility] None  11/29/2024  1:30 PM Elie Goody, MD [facility] None   Follow up Visit:  Follow-up Information     Haroldine Laws, CNM. Schedule an appointment as soon as possible for a visit in 6 week(s).   Specialty: Certified Nurse Midwife Contact information: [address] [place] [address] [phone]         [facility] Follow up in 3 day(s).   Why: BP check Contact information: [address] [place] [address] [phone]  Plan:  April Fleming was discharged to home in good condition. Follow-up appointment as directed.    Signed:  Janyce Llanos, CNM 03/15/2024 10:03 AM

## 2024-03-13 NOTE — Anesthesia Preprocedure Evaluation (Addendum)
 Anesthesia Evaluation  Patient identified by MRN, date of birth, ID band Patient awake    Reviewed: Allergy & Precautions, H&P , NPO status , Patient's Chart, lab work & pertinent test results  Airway Mallampati: II  TM Distance: >3 FB Neck ROM: full    Dental no notable dental hx.    Pulmonary neg pulmonary ROS   Pulmonary exam normal        Cardiovascular Exercise Tolerance: Good hypertension, Normal cardiovascular exam     Neuro/Psych    GI/Hepatic negative GI ROS,,,  Endo/Other    Renal/GU   negative genitourinary   Musculoskeletal   Abdominal   Peds  Hematology negative hematology ROS (+)   Anesthesia Other Findings Past Medical History: No date: Abdominal pain No date: ASD (atrial septal defect) No date: Costochondritis 12/23/2020: COVID-19 11/08/2021: Dysplastic nevus     Comment:  mild, R shoulder, recheck at followup No date: Family history of malignant melanoma     Comment:  father, deceased No date: IBS (irritable bowel syndrome) No date: Irregular periods/menstrual cycles No date: Migraine headache without aura No date: Vomiting  Past Surgical History: No date: WISDOM TOOTH EXTRACTION     Reproductive/Obstetrics (+) Pregnancy                              Anesthesia Physical Anesthesia Plan  ASA: 2  Anesthesia Plan: Epidural   Post-op Pain Management:    Induction:   PONV Risk Score and Plan:   Airway Management Planned:   Additional Equipment:   Intra-op Plan:   Post-operative Plan:   Informed Consent: I have reviewed the patients History and Physical, chart, labs and discussed the procedure including the risks, benefits and alternatives for the proposed anesthesia with the patient or authorized representative who has indicated his/her understanding and acceptance.       Plan Discussed with: Anesthesiologist and CRNA  Anesthesia Plan  Comments:          Anesthesia Quick Evaluation

## 2024-03-13 NOTE — Progress Notes (Signed)
 Labor Progress Note  April Fleming is a 23 y.o. G1P0000 at [redacted]w[redacted]d by LMP admitted for induction of labor due to [facility].  Subjective: SROM with increase in cramping/UCs  Objective: BP 134/60   Pulse 74   Temp 98.1 F (36.7 C) (Oral)   Resp 16   LMP 06/26/2023   Fetal Assessment: FHT:  FHR: 145 bpm, variability: moderate,  accelerations:  Present,  decelerations:  Absent Category/reactivity:  Category I UC:   irregular SVE:    Dilation: 2cm  Effacement: 60%  Station:  -1  Consistency: soft  Position: middle  Membrane status: SROM at 1158 Amniotic color: Clear  Labs: Lab Results  Component Value Date   WBC 12.6 (H) 03/11/2024   HGB 12.3 03/11/2024   HCT 35.5 (L) 03/11/2024   MCV 89.2 03/11/2024   PLT 223 03/11/2024    Assessment / Plan: Induction of labor due to gestational hypertension 1540 Cytotec PO and PV     1/60/-2 2005 1.5/60/-2 2038 Cytotec PO and PV 0145 1.5/60/-2 0600 2/60/-1 0927 Starting Pitocin 1158 SROM with clear fluid Currently Pitocin is at 6mU  Labor: Progressing normally  Preeclampsia:   PIH labs completed 03/11/24 at 1103 Denies PIH sx Vitals:   03/12/24 1229 03/12/24 1244 03/12/24 1259 03/12/24 1314  BP: (!) 146/75 (!) 147/86 (!) 142/88 (!) 150/83   03/12/24 1329 03/12/24 1344 03/12/24 1359 03/12/24 1756  BP: (!) 144/92 (!) 144/84 (!) 142/97 (!) 144/87   03/13/24 0054 03/13/24 0858 03/13/24 0927 03/13/24 1113  BP: 133/76 132/86 133/81 134/60   Fetal Wellbeing:  Category I Pain Control:  Labor support without medications I/D:   Afebrile, GBS neg, SROM x 0 hrs  Anticipated MOD:  NSVD  Cyril Mourning, CNM 03/13/2024, 12:45 PM

## 2024-03-14 LAB — CBC
HCT: 34.4 % — ABNORMAL LOW (ref 36.0–46.0)
Hemoglobin: 11.7 g/dL — ABNORMAL LOW (ref 12.0–15.0)
MCH: 31.2 pg (ref 26.0–34.0)
MCHC: 34 g/dL (ref 30.0–36.0)
MCV: 91.7 fL (ref 80.0–100.0)
Platelets: 203 10*3/uL (ref 150–400)
RBC: 3.75 MIL/uL — ABNORMAL LOW (ref 3.87–5.11)
RDW: 13.5 % (ref 11.5–15.5)
WBC: 15.3 10*3/uL — ABNORMAL HIGH (ref 4.0–10.5)
nRBC: 0 % (ref 0.0–0.2)

## 2024-03-14 MED ORDER — COCONUT OIL OIL
1.0000 | TOPICAL_OIL | Status: DC | PRN
  Administered 2024-03-14: 1 via TOPICAL
  Filled 2024-03-14: qty 7.5

## 2024-03-14 MED ORDER — DIPHENHYDRAMINE HCL 12.5 MG/5ML PO ELIX
25.0000 mg | ORAL_SOLUTION | ORAL | Status: AC
  Administered 2024-03-15: 25 mg via ORAL
  Filled 2024-03-14: qty 10

## 2024-03-14 MED ORDER — OXYCODONE HCL 5 MG PO TABS: 10.0000 mg | ORAL_TABLET | ORAL | Status: DC | PRN

## 2024-03-14 MED ORDER — TETANUS-DIPHTH-ACELL PERTUSSIS 5-2.5-18.5 LF-MCG/0.5 IM SUSY: 0.5000 mL | PREFILLED_SYRINGE | Freq: Once | INTRAMUSCULAR | Status: DC

## 2024-03-14 MED ORDER — DIPHENHYDRAMINE HCL 25 MG PO CAPS
25.0000 mg | ORAL_CAPSULE | Freq: Four times a day (QID) | ORAL | Status: DC | PRN
Start: 2024-03-14 — End: 2024-03-15

## 2024-03-14 MED ORDER — ONDANSETRON HCL 4 MG/2ML IJ SOLN: 4.0000 mg | INTRAMUSCULAR | Status: DC | PRN

## 2024-03-14 MED ORDER — SENNOSIDES-DOCUSATE SODIUM 8.6-50 MG PO TABS
2.0000 | ORAL_TABLET | Freq: Every day | ORAL | Status: DC
  Administered 2024-03-14: 1 via ORAL
  Filled 2024-03-14 (×2): qty 2

## 2024-03-14 MED ORDER — OXYCODONE HCL 5 MG PO TABS: 5.0000 mg | ORAL_TABLET | ORAL | Status: DC | PRN

## 2024-03-14 MED ORDER — PRENATAL MULTIVITAMIN CH
1.0000 | ORAL_TABLET | Freq: Every day | ORAL | Status: DC
  Administered 2024-03-14 – 2024-03-15 (×2): 1 via ORAL
  Filled 2024-03-14 (×2): qty 1

## 2024-03-14 MED ORDER — FERROUS SULFATE 325 (65 FE) MG PO TABS
325.0000 mg | ORAL_TABLET | Freq: Two times a day (BID) | ORAL | Status: DC
  Administered 2024-03-14 – 2024-03-15 (×3): 325 mg via ORAL
  Filled 2024-03-14 (×3): qty 1

## 2024-03-14 MED ORDER — SIMETHICONE 80 MG PO CHEW: 80.0000 mg | CHEWABLE_TABLET | ORAL | Status: DC | PRN

## 2024-03-14 MED ORDER — ONDANSETRON HCL 4 MG PO TABS: 4.0000 mg | ORAL_TABLET | ORAL | Status: DC | PRN

## 2024-03-14 NOTE — TOC CM/SW Note (Signed)
 TOC consult received: "Patient requesting options and assistance with obtaining insurance for her and the newborn."  CSW reached out to Financial Counseling and requested they follow up with [name].   Alfonso Ramus, LCSW Transitions of Care Department [phone]

## 2024-03-14 NOTE — Anesthesia Post-op Follow-up Note (Signed)
  Anesthesia Pain Follow-up Note  Patient: April Fleming  Day #: 1  Date of Follow-up: 03/14/2024 Time: 8:48 AM  Last Vitals:  Vitals:   03/14/24 0513 03/14/24 0840  BP: (!) 131/91 129/82  Pulse: 74 75  Resp: 17 17  Temp: (!) 36.4 C 36.4 C  SpO2:  100%    Level of Consciousness: alert  Pain: none   Side Effects:None  Catheter Site Exam:clean, dry, no drainage  Epidural / Intrathecal (From admission, onward)    Start     Dose/Rate Route Frequency Ordered Stop   03/13/24 1354  fentaNYL 2 mcg/mL w/ bupivacaine 0.125% in NS 250 mL epidural infusion        12 mL/hr 12 mL/hr  Epidural Continuous PRN 03/13/24 1354          Plan: D/C from anesthesia care at surgeon's request  [facility]

## 2024-03-14 NOTE — Lactation Note (Signed)
 This note was copied from a baby's chart. Lactation Consultation Note  Patient Name: April Fleming [id number] Date: 03/14/2024 Age:24 hours Reason for consult: Initial assessment;Primapara;Early term 37-38.6wks;Breastfeeding assistance   Maternal Data This is mom's 1st baby,SVD.  Mom with history of anxiety, infertility, and GHTN.  On initial visit assisted mom with breastfeeding. Per mom baby has been latching and breastfeeding well, having wet and stool diapers.  Has patient been taught Hand Expression?: Yes Does the patient have breastfeeding experience prior to this delivery?: No  Feeding Mother's Current Feeding Choice: Breast Milk Provided mom tips and strategies to maximize position and latch techniques. Observed feeding. Baby breastfed well with audible swallows noted by mom and LC.  LATCH Score Latch: Grasps breast easily, tongue down, lips flanged, rhythmical sucking.  Audible Swallowing: Spontaneous and intermittent  Type of Nipple: Everted at rest and after stimulation  Comfort (Breast/Nipple): Soft / non-tender  Hold (Positioning): Assistance needed to correctly position infant at breast and maintain latch.  LATCH Score: 9   Interventions Interventions: Breast feeding basics reviewed;Assisted with latch;Breast compression;Adjust position;Support pillows;Position options;Education  Discharge Discharge Education: Engorgement and breast care;Outpatient recommendation;Warning signs for feeding baby Pump: Personal  Consult Status Consult Status: Follow-up Date: 03/15/24 Follow-up type: In-patient  Update provided to care nurse  Fuller Song 03/14/2024, 7:52 PM

## 2024-03-14 NOTE — Progress Notes (Signed)
 Post Partum Day 1  Subjective: Doing well, no concerns. Ambulating without difficulty, pain managed with PO meds, tolerating regular diet, and voiding without difficulty.   No fever/chills, chest pain, shortness of breath, nausea/vomiting, or leg pain. No nipple or breast pain. No headache, visual changes, or RUQ/epigastric pain.  Objective: BP 129/82 (BP Location: Left Arm)   Pulse 75   Temp 97.6 F (36.4 C)   Resp 17   LMP 06/26/2023   SpO2 100%    Physical Exam:  General: alert and cooperative Breasts: soft/nontender CV: RRR Pulm: nl effort Abdomen: soft, non-tender Uterine Fundus: firm Incision: n/a Perineum: minimal edema, intact Lochia: appropriate DVT Evaluation: No evidence of DVT seen on physical exam. Edinburgh:     03/14/2024    8:15 AM 05/17/2022   10:48 AM  Edinburgh Postnatal Depression Scale Screening Tool  I have been able to laugh and see the funny side of things. 0 0  I have looked forward with enjoyment to things. 0 0  I have blamed myself unnecessarily when things went wrong. 0 0  I have been anxious or worried for no good reason. 1 1  I have felt scared or panicky for no good reason. 1 2  Things have been getting on top of me. 1 0  I have been so unhappy that I have had difficulty sleeping. 0 1  I have felt sad or miserable. 0 1  I have been so unhappy that I have been crying. 1 1  The thought of harming myself has occurred to me. 0 0  Edinburgh Postnatal Depression Scale Total 4 6     Recent Labs    03/11/24 1134 03/14/24 0753  HGB 12.3 11.7*  HCT 35.5* 34.4*  WBC 12.6* 15.3*  PLT 223 203    Assessment/Plan: 22 y.o. G1P0000 postpartum day # 1  1. Continue routine postpartum care  2. Infant feeding status: breast feeding -Lactation consult PRN for breastfeeding   3. Contraception plan:  TBD  4. Acute blood loss anemia - clinically not significant .  -Hemodynamically stable and asymptomatic -Intervention: continue on oral  supplementation with ferrous sulfate 325  5. Immunization status:   all immunizations up to date   Disposition: Continue inpatient postpartum care    LOS: 2 days   April Carlyon, CNM 03/14/2024, 11:09 AM

## 2024-03-14 NOTE — Anesthesia Postprocedure Evaluation (Signed)
 Anesthesia Post Note  Patient: April Fleming  Procedure(s) Performed: AN AD HOC LABOR EPIDURAL  Patient location during evaluation: Mother Baby Anesthesia Type: Epidural Level of consciousness: awake and alert Pain management: pain level controlled Vital Signs Assessment: post-procedure vital signs reviewed and stable Respiratory status: spontaneous breathing, nonlabored ventilation and respiratory function stable Cardiovascular status: stable Postop Assessment: no headache, no backache and epidural receding Anesthetic complications: no  No notable events documented.   Last Vitals:  Vitals:   03/14/24 0513 03/14/24 0840  BP: (!) 131/91 129/82  Pulse: 74 75  Resp: 17 17  Temp: (!) 36.4 C 36.4 C  SpO2:  100%    Last Pain:  Vitals:   03/14/24 0513  TempSrc: Oral  PainSc:                  Stephanie Coup

## 2024-03-14 NOTE — Plan of Care (Signed)
  Problem: Education: Goal: Knowledge of disease or condition will improve Outcome: Progressing Goal: Knowledge of the prescribed therapeutic regimen will improve Outcome: Progressing   Problem: Fluid Volume: Goal: Peripheral tissue perfusion will improve Outcome: Progressing   Problem: Clinical Measurements: Goal: Complications related to disease process, condition or treatment will be avoided or minimized Outcome: Progressing   Problem: Education: Goal: Knowledge of General Education information will improve Description: Including pain rating scale, medication(s)/side effects and non-pharmacologic comfort measures Outcome: Progressing   Problem: Health Behavior/Discharge Planning: Goal: Ability to manage health-related needs will improve Outcome: Progressing   Problem: Clinical Measurements: Goal: Ability to maintain clinical measurements within normal limits will improve Outcome: Progressing Goal: Will remain free from infection Outcome: Progressing Goal: Diagnostic test results will improve Outcome: Progressing Goal: Respiratory complications will improve Outcome: Progressing Goal: Cardiovascular complication will be avoided Outcome: Progressing   Problem: Activity: Goal: Risk for activity intolerance will decrease Outcome: Progressing   Problem: Nutrition: Goal: Adequate nutrition will be maintained Outcome: Progressing   Problem: Coping: Goal: Level of anxiety will decrease Outcome: Progressing   Problem: Elimination: Goal: Will not experience complications related to bowel motility Outcome: Progressing Goal: Will not experience complications related to urinary retention Outcome: Progressing   Problem: Pain Managment: Goal: General experience of comfort will improve and/or be controlled Outcome: Progressing   Problem: Safety: Goal: Ability to remain free from injury will improve Outcome: Progressing   Problem: Skin Integrity: Goal: Risk for impaired  skin integrity will decrease Outcome: Progressing   Problem: Education: Goal: Knowledge of Childbirth will improve Outcome: Completed/Met Goal: Ability to make informed decisions regarding treatment and plan of care will improve Outcome: Completed/Met Goal: Ability to state and carry out methods to decrease the pain will improve Outcome: Completed/Met Goal: Individualized Educational Video(s) Outcome: Completed/Met   Problem: Coping: Goal: Ability to verbalize concerns and feelings about labor and delivery will improve Outcome: Completed/Met   Problem: Life Cycle: Goal: Ability to make normal progression through stages of labor will improve Outcome: Completed/Met Goal: Ability to effectively push during vaginal delivery will improve Outcome: Completed/Met   Problem: Role Relationship: Goal: Will demonstrate positive interactions with the child Outcome: Completed/Met   Problem: Safety: Goal: Risk of complications during labor and delivery will decrease Outcome: Completed/Met   Problem: Pain Management: Goal: Relief or control of pain from uterine contractions will improve Outcome: Completed/Met

## 2024-03-15 MED ORDER — WITCH HAZEL-GLYCERIN EX PADS: 1.0000 | MEDICATED_PAD | CUTANEOUS | 0 refills | Status: DC | PRN

## 2024-03-15 MED ORDER — SENNOSIDES-DOCUSATE SODIUM 8.6-50 MG PO TABS: 2.0000 | ORAL_TABLET | Freq: Every evening | ORAL | 0 refills | Status: DC | PRN

## 2024-03-15 MED ORDER — IBUPROFEN 600 MG PO TABS: 600.0000 mg | ORAL_TABLET | Freq: Four times a day (QID) | ORAL | 0 refills | Status: AC | PRN

## 2024-03-15 MED ORDER — BENZOCAINE-MENTHOL 20-0.5 % EX AERO: 1.0000 | INHALATION_SPRAY | CUTANEOUS | 0 refills | Status: DC | PRN

## 2024-03-15 MED ORDER — FERROUS SULFATE 325 (65 FE) MG PO TABS: 325.0000 mg | ORAL_TABLET | Freq: Two times a day (BID) | ORAL | 3 refills | Status: DC

## 2024-03-15 MED ORDER — ACETAMINOPHEN 325 MG PO TABS: 650.0000 mg | ORAL_TABLET | Freq: Four times a day (QID) | ORAL | 0 refills | Status: AC | PRN

## 2024-03-15 NOTE — Progress Notes (Signed)
 Pt. Is awake and alert. Smiling. She stated she has had some sleep. Her BP is improved at 121/62 and she denies chest pain and/or any other c/o.

## 2024-03-15 NOTE — Discharge Instructions (Signed)
 Call office if you have any of the following:  -Persistent headache or visual changes (possible high blood pressure) -Fever >101.0 F or chills (possible infection) -Breast concerns (engorgement, mastitis) -Excessive vaginal bleeding (soaking through more than one pad in 1 hr x 2 hr) -Incision drainage/redness/increased pain/warmth at site (possible infection)  -Leg pain or redness (possible blood clot) -Depression/anxiety increased symptoms 2 weeks after delivery  Activity & Hygiene: -Do not lift > 10 lbs for 6 weeks.  -No intercourse or tampons for 6 weeks.  -No swimming pools, hot tubs or tub baths- showers only for 6 weeks **No driving for 1-2 weeks or while taking pain medication after c-section  -It is normal to bleed for up to 6 weeks. You should not soak through more than 1 pad in 1 hour x 2 hours.  Breastfeeding: -Continue prenatal vitamin.  -Increase calories and fluids.  -Your milk will come in, in the next couple of days (right now it is colostrum).  -You may have a slight fever when your milk comes in, but it should go away on its own.   -If it does not, and rises above 101 F please call the doctor.  -You will also feel achy and your breasts will be firm. They will also start to leak.  *For breastfeeding concerns, the lactation consultant can be reached at [phone]  Not Breastfeeding: -Avoid breast stimulation and wear supportive bra -ICE helps decrease inflammation and pain -Express milk for comfort by hand, do not empty breast  Postpartum blues: -feelings of happy one minute and sad another minute are normal for the first few weeks. -if it gets worse please let your doctor know. It is very common!!

## 2024-03-15 NOTE — Lactation Note (Signed)
 This note was copied from a baby's chart. Lactation Consultation Note  Patient Name: April Fleming [id number] Date: 03/15/2024 Age:24 hours Reason for consult: Early term 37-38.6wks;Follow-up assessment;Primapara   Maternal Data   Follow up assessment. [name] reports feeling both overwhelmed and excited to continue trying to nurse at home. LC recommended to call for help as needed.   Discharge Discharge Education: Engorgement and breast care;Outpatient recommendation  LC provided patient [name] outpatient lactation services phone number. Patient verbalized understanding.  Consult Status Consult Status: Complete Follow-up type: Call as needed    [name] 03/15/2024, 12:52 PM

## 2024-03-15 NOTE — Progress Notes (Signed)
 Notified J. Oxley CNM of Pt. C/O Heaviness in chest and inability to take a deep Breath. I also reported 2340 complete set of VS and the last 3 B/P.'s. J. Oxley CNM ordered Benadryl  for sleep and she stated if B/P's cont. Elevated nest ordered checks, she may order a PIH Panel. Pt. Informed and Formula provided fro Infant so the FOB can feed Infant while Mom sleeps. Willcont. To follow closely.

## 2024-03-15 NOTE — Progress Notes (Signed)
 Patient discharged home with family.  Discharge instructions, when to follow up, and medications reviewed with patient.  Patient verbalized understanding. Patient will be escorted out by staff.

## 2024-03-15 NOTE — Progress Notes (Signed)
 Pt. Given Benadryl as ordered for sleep. She is alert and smiling and denies any heaviness in chest at this time. Will cont. To follow closely.

## 2024-03-15 NOTE — Progress Notes (Signed)
 Called to room by pt. Who is alert and oriented, teary, and with c/o feeling,"Heavy in chest and unable to take a deep breath. VS: 98.2, HR is 86, RR unlabored at 18, B/P is 143/92 and O2 Sat. Is 100% on R/A. Color sl flushed, skin w&D, denies any other PIH s/s. Cap refill < 2 seconds.  Pt. Also c/o exhaustion and that she has not slept in 4 days. Will notify Provider.

## 2024-04-14 ENCOUNTER — Ambulatory Visit: Payer: 59

## 2024-04-22 ENCOUNTER — Other Ambulatory Visit: Payer: 59

## 2024-04-23 LAB — HM PAP SMEAR: HM Pap smear: HIGH

## 2024-05-05 ENCOUNTER — Ambulatory Visit (INDEPENDENT_AMBULATORY_CARE_PROVIDER_SITE_OTHER): Admitting: Family Medicine

## 2024-05-05 ENCOUNTER — Encounter: Payer: Self-pay | Admitting: Family Medicine

## 2024-05-05 ENCOUNTER — Ambulatory Visit: Admitting: Family Medicine

## 2024-05-05 VITALS — BP 132/80 | HR 83 | Ht 63.0 in | Wt 140.5 lb

## 2024-05-05 DIAGNOSIS — Z0000 Encounter for general adult medical examination without abnormal findings: Secondary | ICD-10-CM

## 2024-05-05 DIAGNOSIS — F411 Generalized anxiety disorder: Secondary | ICD-10-CM

## 2024-05-05 DIAGNOSIS — R7309 Other abnormal glucose: Secondary | ICD-10-CM | POA: Diagnosis not present

## 2024-05-05 DIAGNOSIS — G43909 Migraine, unspecified, not intractable, without status migrainosus: Secondary | ICD-10-CM | POA: Insufficient documentation

## 2024-05-05 DIAGNOSIS — F410 Panic disorder [episodic paroxysmal anxiety] without agoraphobia: Secondary | ICD-10-CM

## 2024-05-05 MED ORDER — ESCITALOPRAM OXALATE 5 MG PO TABS: 5.0000 mg | ORAL_TABLET | Freq: Every day | ORAL | 1 refills | Status: DC

## 2024-05-05 NOTE — Progress Notes (Signed)
 Subjective:    Patient ID: April Fleming, female    DOB: [birth year], 23 y.o.   MRN: [id number]  April Fleming is a 23 y.o. female presenting on 05/05/2024 for Annual Exam   HPI  Discussed the use of AI scribe software for clinical note transcription with the patient, who gave verbal consent to proceed.  History of Present Illness   April Fleming is a 23 year old female who presents for an annual physical exam.  She has completed her vaccinations, including a Tdap booster on March 6th, and does not require a COVID booster at this time.  She has a history of elevated blood pressure during her recent pregnancy, with readings as high as 171/111 mmHg, which led to her being placed on Procardia 30 mg. Her blood pressure has since stabilized, and she is no longer on medication. She monitors her blood pressure at home, noting occasional spikes when anxious, but generally maintains readings around 132/80 mmHg.  She experiences increased anxiety, impacting her daily activities and social interactions. She was previously prescribed Lexapro  but did not take it due to concerns about side effects. She is not breastfeeding currently. She is interested to re-try medication.  She has been experiencing headaches, particularly in the occipital region, which she associates with her previous history of migraines. These headaches have increased since her recent pregnancy. She manages them with Tylenol , which provides relief. Occasional NSAID / Excedrin. - Not on medication for migraine  She reports tingling and pain in her right ear, which she associates with fluid buildup and sinus issues. She also has a history of TMJ, which she believes contributes to her symptoms. She has not pursued treatment for TMJ but is considering physical therapy.      Health Maintenance:  GYN updates [facility] - Last Pap smear two weeks ago showed atypical squamous cells of undetermined significance (ASCUS) but  was negative for high-risk HPV.     05/05/2024    3:32 PM 03/20/2023    8:28 AM 11/28/2022   10:10 AM  Depression screen PHQ 2/9  Decreased Interest 0 0 0  Down, Depressed, Hopeless 0 1 0  PHQ - 2 Score 0 1 0  Altered sleeping 2 0 0  Tired, decreased energy 3 1 0  Change in appetite 0 0 0  Feeling bad or failure about yourself  0 0 0  Trouble concentrating 1 0 0  Moving slowly or fidgety/restless 0 0 0  Suicidal thoughts 0 0 0  PHQ-9 Score 6 2 0  Difficult doing work/chores Somewhat difficult Not difficult at all Not difficult at all       05/05/2024    3:32 PM 03/20/2023    8:28 AM 11/28/2022   10:11 AM 08/22/2022    9:54 AM  GAD 7 : Generalized Anxiety Score  Nervous, Anxious, on Edge 3 3 1 2   Control/stop worrying 3 3 1 2   Worry too much - different things 3 3 1 2   Trouble relaxing 2 0 0 0  Restless 0 0 0 0  Easily annoyed or irritable 3 0 1 2  Afraid - awful might happen 3 3 1 2   Total GAD 7 Score 17 12 5 10   Anxiety Difficulty Somewhat difficult Not difficult at all Not difficult at all      Past Medical History:  Diagnosis Date   Abdominal pain    ASD (atrial septal defect)    Costochondritis    COVID-19 12/23/2020  Dysplastic nevus 11/08/2021   mild, R shoulder, recheck at followup   Family history of malignant melanoma    father, deceased   IBS (irritable bowel syndrome)    Irregular periods/menstrual cycles    Migraine headache without aura    Vomiting    Past Surgical History:  Procedure Laterality Date   WISDOM TOOTH EXTRACTION     Social History   Socioeconomic History   Marital status: Married    Spouse name: Fabian Holster   Number of children: Not on file   Years of education: Not on file   Highest education level: High school graduate  Occupational History   Not on file  Tobacco Use   Smoking status: Never   Smokeless tobacco: Never  Vaping Use   Vaping status: Former   Quit date: 05/12/2019   Substances: Nicotine, Flavoring   Devices:  self-filled vaporizer  Substance and Sexual Activity   Alcohol use: Not Currently    Comment: occasionally   Drug use: Never   Sexual activity: Yes    Birth control/protection: Pill  Other Topics Concern   Not on file  Social History Narrative   Works at [employer] Care   Social Drivers of Health   Financial Resource Strain: Not on file  Food Insecurity: No Food Insecurity (03/12/2024)   Hunger Vital Sign    Worried About Running Out of Food in the Last Year: Never true    Ran Out of Food in the Last Year: Never true  Transportation Needs: No Transportation Needs (03/12/2024)   PRAPARE - Administrator, Civil Service (Medical): No    Lack of Transportation (Non-Medical): No  Physical Activity: Not on file  Stress: Not on file  Social Connections: Not on file  Intimate Partner Violence: Patient Unable To Answer (03/15/2024)   Humiliation, Afraid, Rape, and Kick questionnaire    Fear of Current or Ex-Partner: Patient unable to answer    Emotionally Abused: Patient unable to answer    Physically Abused: Patient unable to answer    Sexually Abused: Patient unable to answer   Family History  Problem Relation Age of Onset   COPD Mother    Hypertension Mother    Peptic Ulcer Mother    Melanoma Father 67   Melanoma Maternal Grandmother    Hypertension Maternal Grandmother    Peptic Ulcer Maternal Grandmother    Healthy Maternal Grandfather    Intellectual disability Maternal Grandfather    Dementia Paternal Grandmother    Healthy Other    Healthy Other    Current Outpatient Medications on File Prior to Visit  Medication Sig   acetaminophen  (TYLENOL ) 325 MG tablet Take 2 tablets (650 mg total) by mouth every 6 (six) hours as needed for fever or mild pain (pain score 1-3) (for pain scale < 4).   ibuprofen  (ADVIL ) 600 MG tablet Take 1 tablet (600 mg total) by mouth every 6 (six) hours as needed for fever, mild pain (pain score 1-3) or cramping.   Multiple Vitamin  (MULTIVITAMIN WITH MINERALS) TABS tablet Take 1 tablet by mouth daily.   No current facility-administered medications on file prior to visit.    Review of Systems  Constitutional:  Negative for activity change, appetite change, chills, diaphoresis, fatigue and fever.  HENT:  Negative for congestion and hearing loss.   Eyes:  Negative for visual disturbance.  Respiratory:  Negative for cough, chest tightness, shortness of breath and wheezing.   Cardiovascular:  Negative for chest pain, palpitations  and leg swelling.  Gastrointestinal:  Negative for abdominal pain, constipation, diarrhea, nausea and vomiting.  Genitourinary:  Negative for dysuria, frequency and hematuria.  Musculoskeletal:  Negative for arthralgias and neck pain.  Skin:  Negative for rash.  Neurological:  Negative for dizziness, weakness, light-headedness, numbness and headaches.  Hematological:  Negative for adenopathy.  Psychiatric/Behavioral:  Negative for behavioral problems, dysphoric mood and sleep disturbance.    Per HPI unless specifically indicated above     Objective:     BP 132/80 (BP Location: Left Arm, Patient Position: Sitting, Cuff Size: Normal)   Pulse 83   Ht 5\' 3"  (1.6 m)   Wt 140 lb 8 oz (63.7 kg)   SpO2 98%   BMI 24.89 kg/m   Wt Readings from Last 3 Encounters:  05/05/24 140 lb 8 oz (63.7 kg)  02/26/24 160 lb (72.6 kg)  02/12/24 157 lb (71.2 kg)    Physical Exam Vitals and nursing note reviewed.  Constitutional:      General: She is not in acute distress.    Appearance: She is well-developed. She is not diaphoretic.     Comments: Well-appearing, comfortable, cooperative  HENT:     Head: Normocephalic and atraumatic.     Right Ear: Ear canal and external ear normal. There is no impacted cerumen.     Left Ear: Ear canal and external ear normal. There is no impacted cerumen.     Ears:     Comments: Clear effusion bilateral R>L, no erythema, no bulging Eyes:     General:        Right  eye: No discharge.        Left eye: No discharge.     Conjunctiva/sclera: Conjunctivae normal.     Pupils: Pupils are equal, round, and reactive to light.  Neck:     Thyroid : No thyromegaly.     Vascular: No carotid bruit.  Cardiovascular:     Rate and Rhythm: Normal rate and regular rhythm.     Pulses: Normal pulses.     Heart sounds: Normal heart sounds. No murmur heard. Pulmonary:     Effort: Pulmonary effort is normal. No respiratory distress.     Breath sounds: Normal breath sounds. No wheezing or rales.  Abdominal:     General: Bowel sounds are normal. There is no distension.     Palpations: Abdomen is soft. There is no mass.     Tenderness: There is no abdominal tenderness.  Musculoskeletal:        General: No tenderness. Normal range of motion.     Cervical back: Normal range of motion and neck supple.     Right lower leg: No edema.     Left lower leg: No edema.     Comments: Upper / Lower Extremities: - Normal muscle tone, strength bilateral upper extremities 5/5, lower extremities 5/5  Lymphadenopathy:     Cervical: No cervical adenopathy.  Skin:    General: Skin is warm and dry.     Findings: No erythema or rash.  Neurological:     Mental Status: She is alert and oriented to person, place, and time.     Comments: Distal sensation intact to light touch all extremities  Psychiatric:        Mood and Affect: Mood normal.        Behavior: Behavior normal.        Thought Content: Thought content normal.     Comments: Well groomed, good eye contact, normal speech and  thoughts     Results for orders placed or performed in visit on 05/05/24  HM PAP SMEAR   Collection Time: 04/23/24 12:00 AM  Result Value Ref Range   HM Pap smear      ASCUS (abnormal), Negative high risk HPV repeat 3 years      Assessment & Plan:   Problem List Items Addressed This Visit     Episodic migraine   Relevant Medications   escitalopram  (LEXAPRO ) 5 MG tablet   Generalized anxiety  disorder with panic attacks   Relevant Medications   escitalopram  (LEXAPRO ) 5 MG tablet   Other Visit Diagnoses       Annual physical exam    -  Primary   Relevant Orders   Hemoglobin A1c   CBC with Differential/Platelet   Comprehensive metabolic panel with GFR     Abnormal glucose       Relevant Orders   Hemoglobin A1c       Updated Health Maintenance information Non fasting labs today Encouraged improvement to lifestyle with diet and exercise Goal of weight loss  Generalized Anxiety w/ panic history Increased anxiety with social anxiety. Lexapro  chosen for efficacy in managing anxiety and mood disorders. Discussed potential side effects and onset of action. Note she is not breastfeeding currently, SSRI is good option She never tried it before on prior rx  - Prescribe Lexapro  5 mg daily with food, 90-day supply with one refill. - Monitor for gastrointestinal side effects. - Consider Buspar  add on in future - Follow-up in 6 months to assess medication efficacy.  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 TMJ likely contributing to ear symptoms. Discussed potential for physical therapy. - Consider Flonase  nasal spray to alleviate sinus pressure. - Decongestant AS NEEDED  - Consider referral to physical therapy for TMJ management if symptoms persist. Recommend Jason Huprich PT [place] if interested  Atypical squamous cells of undetermined significance (ASCUS) Per GYN Recent Pap smear showed ASCUS with negative high-risk HPV. Follow standard guidelines for repeat screening. - Repeat Pap smear in three years as per guidelines.         Orders Placed This Encounter  Procedures   HM PAP SMEAR    This external order was created through the Results Console.   Hemoglobin A1c   CBC with Differential/Platelet   Comprehensive metabolic panel with GFR    Has the patient fasted?:   Yes    Meds ordered this encounter  Medications   escitalopram  (LEXAPRO ) 5 MG  tablet    Sig: Take 1 tablet (5 mg total) by mouth daily.    Dispense:  90 tablet    Refill:  1     Follow up plan: Return for 6-12 months either anxiety / migraine follow-up or annual in 1 year.  Domingo Friend, DO [facility] 05/05/2024, 3:46 PM

## 2024-05-05 NOTE — Patient Instructions (Addendum)
 Thank you for coming to the office today.  Start on trial of Escitalopram  5mg  daily for anxiety, keep in mind takes 2-4 weeks for it to take effect. May adjust dose in future.  Take with food, sometimes early GI symptoms possible.  Consider future add on or change Buspar  medication  Consider referral therapist or psych if need more assistance.  For headaches, likely migraine, consider Tylenol , Ibuprofen , Excedrin. Alternating is no problem, if you want to consider migraine only meds let me know.  Labs today  Recommendation for TMJ - consider Physical Therapy referral if when ready.  Crawford Dock PT [facility] [address] [place], [place] [address] [phone]  Please schedule a Follow-up Appointment to: Return for 6-12 months either anxiety / migraine follow-up or annual in 1 year.  If you have any other questions or concerns, please feel free to call the office or send a message through MyChart. You may also schedule an earlier appointment if necessary.  Additionally, you may be receiving a survey about your experience at our office within a few days to 1 week by e-mail or mail. We value your feedback.  Domingo Friend, DO [facility], [place]

## 2024-05-06 ENCOUNTER — Ambulatory Visit: Payer: Self-pay | Admitting: Family Medicine

## 2024-05-06 LAB — CBC WITH DIFFERENTIAL/PLATELET
Absolute Lymphocytes: 3394 {cells}/uL (ref 850–3900)
Absolute Monocytes: 624 {cells}/uL (ref 200–950)
Basophils Absolute: 73 {cells}/uL (ref 0–200)
Basophils Relative: 0.9 %
Eosinophils Absolute: 259 {cells}/uL (ref 15–500)
Eosinophils Relative: 3.2 %
HCT: 41.4 % (ref 35.0–45.0)
Hemoglobin: 13.7 g/dL (ref 11.7–15.5)
MCH: 30.2 pg (ref 27.0–33.0)
MCHC: 33.1 g/dL (ref 32.0–36.0)
MCV: 91.2 fL (ref 80.0–100.0)
MPV: 9.2 fL (ref 7.5–12.5)
Monocytes Relative: 7.7 %
Neutro Abs: 3750 {cells}/uL (ref 1500–7800)
Neutrophils Relative %: 46.3 %
Platelets: 249 10*3/uL (ref 140–400)
RBC: 4.54 10*6/uL (ref 3.80–5.10)
RDW: 12.6 % (ref 11.0–15.0)
Total Lymphocyte: 41.9 %
WBC: 8.1 10*3/uL (ref 3.8–10.8)

## 2024-05-06 LAB — HEMOGLOBIN A1C
Hgb A1c MFr Bld: 5.2 % (ref <5.7)
Mean Plasma Glucose: 103 mg/dL
eAG (mmol/L): 5.7 mmol/L

## 2024-05-06 LAB — COMPREHENSIVE METABOLIC PANEL WITH GFR
AG Ratio: 1.8 (calc) (ref 1.0–2.5)
ALT: 36 U/L — ABNORMAL HIGH (ref 6–29)
AST: 26 U/L (ref 10–30)
Albumin: 4.7 g/dL (ref 3.6–5.1)
Alkaline phosphatase (APISO): 68 U/L (ref 31–125)
BUN: 18 mg/dL (ref 7–25)
CO2: 25 mmol/L (ref 20–32)
Calcium: 9.9 mg/dL (ref 8.6–10.2)
Chloride: 104 mmol/L (ref 98–110)
Creat: 0.87 mg/dL (ref 0.50–0.96)
Globulin: 2.6 g/dL (ref 1.9–3.7)
Glucose, Bld: 89 mg/dL (ref 65–139)
Potassium: 4 mmol/L (ref 3.5–5.3)
Sodium: 139 mmol/L (ref 135–146)
Total Bilirubin: 0.3 mg/dL (ref 0.2–1.2)
Total Protein: 7.3 g/dL (ref 6.1–8.1)
eGFR: 95 mL/min/{1.73_m2} (ref >=60)

## 2024-10-21 ENCOUNTER — Ambulatory Visit: Payer: Self-pay

## 2024-10-21 NOTE — Telephone Encounter (Signed)
 FYI Only or Action Required?: FYI only for provider: patient declined an appointment.  Patient was last seen in primary care on 05/08/2023 by Antonette Angeline ORN, NP.  Called Nurse Triage reporting Headache.  Symptoms began today.  Interventions attempted: OTC medications: Tylenol .  Symptoms are: gradually improving.  Triage Disposition: See Physician Within 24 Hours  Patient/caregiver understands and will follow disposition?: No, refuses disposition     Copied from CRM #[id number]. Topic: Clinical - Red Word Triage >> Oct 21, 2024  1:23 PM Geneva B wrote: [facility] that prompted transfer to Nurse Triage: patient is calling in with severe headache and vomiting and diarrhea       Reason for Disposition  [1] MODERATE headache (e.g., interferes with normal activities) AND [2] present > 24 hours AND [3] unexplained  (Exceptions: Pain medicines not tried, typical migraine, or headache part of viral illness.)  Answer Assessment - Initial Assessment Questions Patient declined an appointment at this time and states she will call back if she feels like she needs to be seen. Care advice discussed with the patient.     1. LOCATION: Where does it hurt?      Diffuse but mostly at the temples  2. ONSET: When did the headache start? (e.g., minutes, hours, days)      Today  3. PATTERN: Does the pain come and go, or has it been constant since it started?     Constant  4. SEVERITY: How bad is the pain? and What does it keep you from doing?  (e.g., Scale 1-10; mild, moderate, or severe)     Moderate to severe  5. RECURRENT SYMPTOM: Have you ever had headaches before? If Yes, ask: When was the last time? and What happened that time?      Only when very sick 6. CAUSE: What do you think is causing the headache?     Unsure  7. MIGRAINE: Have you been diagnosed with migraine headaches? If Yes, ask: Is this headache similar?      Yes, years ago but doesn't normally get them  anymore  8. HEAD INJURY: Has there been any recent injury to your head?      No 9. OTHER SYMPTOMS: Do you have any other symptoms? (e.g., fever, stiff neck, eye pain, sore throat, cold symptoms)     Nausea, vomiting, diarrhea, sore throat  10. PREGNANCY: Is there any chance you are pregnant? When was your last menstrual period?       No  Protocols used: Headache-A-AH

## 2024-10-23 ENCOUNTER — Other Ambulatory Visit: Payer: Self-pay | Admitting: Family Medicine

## 2024-10-23 DIAGNOSIS — F410 Panic disorder [episodic paroxysmal anxiety] without agoraphobia: Secondary | ICD-10-CM

## 2024-10-25 ENCOUNTER — Ambulatory Visit
Admission: EM | Admit: 2024-10-25 | Discharge: 2024-10-25 | Disposition: A | Discharge status: Home / Self Care | Attending: Emergency Medicine | Admitting: Emergency Medicine

## 2024-10-25 DIAGNOSIS — J069 Acute upper respiratory infection, unspecified: Secondary | ICD-10-CM

## 2024-10-25 MED ORDER — PREDNISONE 10 MG (21) PO TBPK: ORAL_TABLET | Freq: Every day | ORAL | 0 refills | Status: DC

## 2024-10-25 MED ORDER — AMOXICILLIN-POT CLAVULANATE 875-125 MG PO TABS: 1.0000 | ORAL_TABLET | Freq: Two times a day (BID) | ORAL | 0 refills | Status: DC

## 2024-10-25 MED ORDER — ALBUTEROL SULFATE HFA 108 (90 BASE) MCG/ACT IN AERS: 2.0000 | INHALATION_SPRAY | RESPIRATORY_TRACT | 0 refills | Status: DC | PRN

## 2024-10-25 NOTE — ED Provider Notes (Signed)
 CAY RALPH PELT    CSN: [id number] Arrival date & time: 10/25/24  1510      History   Chief Complaint Chief Complaint  Patient presents with   Fever    HPI April Fleming is a 23 y.o. female.   Presents for evaluation of fever, headache, nasal congestion, chest congestion, nonproductive cough, bilateral ear pressure, sinus pressure nausea and vomiting beginning 6 days ago.  Has begun to experience chest tightness and shortness of breath with exertion but denies presence of wheezing.  Nausea and vomiting has resolved.  Tolerable to food and liquids but appetite decreased.  Has attempted use of Tylenol  and cough drops.  Known sick contacts in household.   Past Medical History:  Diagnosis Date   Abdominal pain    ASD (atrial septal defect)    Costochondritis    COVID-19 12/23/2020   Dysplastic nevus 11/08/2021   mild, R shoulder, recheck at followup   Family history of malignant melanoma    father, deceased   IBS (irritable bowel syndrome)    Irregular periods/menstrual cycles    Migraine headache without aura    Vomiting     Patient Active Problem List   Diagnosis Date Noted   Episodic migraine 05/05/2024   NSVD (normal spontaneous vaginal delivery) 03/13/2024   Gestational hypertension 03/12/2024   Elevated blood pressure affecting pregnancy in third trimester, antepartum 02/26/2024   Headache in pregnancy, antepartum, third trimester 02/12/2024   Low lying placenta, antepartum 10/17/2023   History of atrial septal defect 10/17/2023   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   Left-sided chest wall pain 05/10/2021   Chronic pain syndrome 05/10/2021   Seizure-like activity (HCC) 10/24/2020   Vasovagal syncope 09/01/2020   Generalized anxiety disorder with panic attacks 09/01/2020   Post concussion syndrome 07/05/2020   Heart murmur 07/05/2020   Intractable migraine without aura and without status migrainosus 06/17/2018   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 Authorizing Provider  acetaminophen  (TYLENOL ) 325 MG tablet Take 2 tablets (650 mg total) by mouth every 6 (six) hours as needed for fever or mild pain (pain score 1-3) (for pain scale < 4). 03/15/24   Tanda Edsel Fuller, CNM  albuterol  (VENTOLIN  HFA) 108 [phone] Base) MCG/ACT inhaler Inhale 2 puffs into the lungs every 4 (four) hours as needed for wheezing or shortness of breath. 10/25/24   Teresa Shelba SAUNDERS, NP  amoxicillin -clavulanate (AUGMENTIN ) 875-125 MG tablet Take 1 tablet by mouth every 12 (twelve) hours. 10/25/24   Chong January, Shelba SAUNDERS, NP  escitalopram  (LEXAPRO ) 5 MG tablet TAKE 1 TABLET (5 MG TOTAL) BY MOUTH DAILY. 10/25/24   [name], Marsa PARAS, DO  ibuprofen  (ADVIL ) 600 MG tablet Take 1 tablet (600 mg total) by mouth every 6 (six) hours as needed for fever, mild pain (pain score 1-3) or cramping. 03/15/24  Tanda Edsel Fuller, CNM  Multiple Vitamin (MULTIVITAMIN WITH MINERALS) TABS tablet Take 1 tablet by mouth daily.    [provider]  predniSONE  (STERAPRED UNI-PAK 21 TAB) 10 MG (21) TBPK tablet Take by mouth daily. Take 6 tabs by mouth daily  for 1 days, then 5 tabs for 1 days, then 4 tabs for 1 days, then 3 tabs for 1 days, 2 tabs for 1 days, then 1 tab by mouth daily for 1 days 10/25/24   Teresa Shelba SAUNDERS, NP    Family History Family History  Problem Relation Age of Onset   COPD Mother    Hypertension Mother    Peptic Ulcer Mother    Melanoma Father 14   Melanoma Maternal Grandmother    Hypertension Maternal Grandmother    Peptic Ulcer Maternal  Grandmother    Healthy Maternal Grandfather    Intellectual disability Maternal Grandfather    Dementia Paternal Grandmother    Healthy Other    Healthy Other     Social History Social History   Tobacco Use   Smoking status: Never   Smokeless tobacco: Never  Vaping Use   Vaping status: Former   Quit date: 05/12/2019   Substances: Nicotine, Flavoring   Devices: self-filled vaporizer  Substance Use Topics   Alcohol use: Not Currently    Comment: occasionally   Drug use: Never     Allergies   Hydrocodone    Review of Systems Review of Systems   Physical Exam Triage Vital Signs ED Triage Vitals  Encounter Vitals Group     BP 10/25/24 1523 134/85     Girls Systolic BP Percentile --      Girls Diastolic BP Percentile --      Boys Systolic BP Percentile --      Boys Diastolic BP Percentile --      Pulse Rate 10/25/24 1523 84     Resp 10/25/24 1523 19     Temp 10/25/24 1523 97.9 F (36.6 C)     Temp src --      SpO2 10/25/24 1523 97 %     Weight --      Height --      Head Circumference --      Peak Flow --      Pain Score 10/25/24 1521 7     Pain Loc --      Pain Education --      Exclude from Growth Chart --    No data found.  Updated Vital Signs BP 134/85   Pulse 84   Temp 97.9 F (36.6 C)   Resp 19   LMP  (LMP Unknown)   SpO2 97%   Breastfeeding No   Visual Acuity Right Eye Distance:   Left Eye Distance:   Bilateral Distance:    Right Eye Near:   Left Eye Near:    Bilateral Near:     Physical Exam Constitutional:      Appearance: Normal appearance.  HENT:     Head: Normocephalic.     Right Ear: Tympanic membrane, ear canal and external ear normal.     Left Ear: Tympanic membrane, ear canal and external ear normal.     Nose: Congestion present.     Mouth/Throat:     Mouth: Mucous membranes are moist.     Pharynx: Oropharynx is clear. No oropharyngeal exudate or posterior oropharyngeal erythema.  Eyes:     Extraocular Movements:  Extraocular movements intact.  Cardiovascular:  Rate and Rhythm: Normal rate and regular rhythm.     Pulses: Normal pulses.     Heart sounds: Normal heart sounds.  Pulmonary:     Effort: Pulmonary effort is normal.     Breath sounds: Normal breath sounds.  Musculoskeletal:     Cervical back: Normal range of motion and neck supple.  Neurological:     Mental Status: She is alert and oriented to person, place, and time. Mental status is at baseline.      UC Treatments / Results  Labs (all labs ordered are listed, but only abnormal results are displayed) Labs Reviewed - No data to display  EKG   Radiology No results found.  Procedures Procedures (including critical care time)  Medications Ordered in UC Medications - No data to display  Initial Impression / Assessment and Plan / UC Course  I have reviewed the triage vital signs and the nursing notes.  Pertinent labs & imaging results that were available during my care of the patient were reviewed by me and considered in my medical decision making (see chart for details).  Acute URI  Patient is in no signs of distress nor toxic appearing.  Vital signs are stable.  Low suspicion for pneumonia, pneumothorax or bronchitis and therefore will defer imaging.  Viral testing deferred in [place].  Prescribed Augmentin , prednisone  and albuterol  inhaler.May use additional over-the-counter medications as needed for supportive care.  May follow-up with urgent care as needed if symptoms persist or worsen.   Final Clinical Impressions(s) / UC Diagnoses   Final diagnoses:  Acute URI     Discharge Instructions      Begin Augmentin  twice daily for 7 days for treatment of bacteria  To help with chest tightness begin prednisone  every morning with food as directed, avoid ibuprofen  while taking but may use Tylenol   You may use inhaler taking 2 puffs every 4 hours as needed for shortness of breath or chest tightness    You can take  Tylenol   as needed for fever reduction and pain relief.   For cough: honey 1/2 to 1 teaspoon (you can dilute the honey in water or another fluid).  You can also use guaifenesin  and dextromethorphan for cough. You can use a humidifier for chest congestion and cough.  If you don't have a humidifier, you can sit in the bathroom with the hot shower running.      For sore throat: try warm salt water gargles, cepacol lozenges, throat spray, warm tea or water with lemon/honey, popsicles or ice, or OTC cold relief medicine for throat discomfort.   For congestion: take a daily anti-histamine like Zyrtec , Claritin, and a oral decongestant, such as pseudoephedrine.  You can also use Flonase  1-2 sprays in each nostril daily.   It is important to stay hydrated: drink plenty of fluids (water, gatorade/powerade/pedialyte, juices, or teas) to keep your throat moisturized and help further relieve irritation/discomfort.    ED Prescriptions     Medication Sig Dispense Auth. Provider   predniSONE  (STERAPRED UNI-PAK 21 TAB) 10 MG (21) TBPK tablet  (Status: Discontinued) Take by mouth daily. Take 6 tabs by mouth daily  for 1 days, then 5 tabs for 1 days, then 4 tabs for 1 days, then 3 tabs for 1 days, 2 tabs for 1 days, then 1 tab by mouth daily for 1 days 21 tablet Nil Xiong R, NP   amoxicillin -clavulanate (AUGMENTIN ) 875-125 MG tablet  (Status: Discontinued) Take 1 tablet by mouth every 12 (twelve) hours.  14 tablet Roya Gieselman R, NP   albuterol  (VENTOLIN  HFA) 108 (90 Base) MCG/ACT inhaler  (Status: Discontinued) Inhale 2 puffs into the lungs every 4 (four) hours as needed for wheezing or shortness of breath. 8 g Haniyyah Sakuma R, NP   albuterol  (VENTOLIN  HFA) 108 (90 Base) MCG/ACT inhaler Inhale 2 puffs into the lungs every 4 (four) hours as needed for wheezing or shortness of breath. 8 g Maury Bamba R, NP   amoxicillin -clavulanate (AUGMENTIN ) 875-125 MG tablet Take 1 tablet by mouth every 12 (twelve)  hours. 14 tablet Antoinett Dorman R, NP   predniSONE  (STERAPRED UNI-PAK 21 TAB) 10 MG (21) TBPK tablet Take by mouth daily. Take 6 tabs by mouth daily  for 1 days, then 5 tabs for 1 days, then 4 tabs for 1 days, then 3 tabs for 1 days, 2 tabs for 1 days, then 1 tab by mouth daily for 1 days 21 tablet Neftali Abair, Shelba SAUNDERS, NP      PDMP not reviewed this encounter.   Teresa Shelba SAUNDERS, NP 10/25/24 1540

## 2024-10-25 NOTE — Telephone Encounter (Signed)
 Requested Prescriptions  Pending Prescriptions Disp Refills   escitalopram  (LEXAPRO ) 5 MG tablet [Pharmacy Med Name: ESCITALOPRAM  5 MG TABLET] 90 tablet 0    Sig: TAKE 1 TABLET (5 MG TOTAL) BY MOUTH DAILY.     Psychiatry:  Antidepressants - SSRI Failed - 10/25/2024 12:33 PM      Failed - Valid encounter within last 6 months    Recent Outpatient Visits           5 months ago Annual physical exam   [place] [facility] [place] Marsa PARAS, DO       Future Appointments             In 1 month Claudene Lehmann, MD [facility]

## 2024-10-25 NOTE — Discharge Instructions (Signed)
 Begin Augmentin  twice daily for 7 days for treatment of bacteria  To help with chest tightness begin prednisone  every morning with food as directed, avoid ibuprofen  while taking but may use Tylenol   You may use inhaler taking 2 puffs every 4 hours as needed for shortness of breath or chest tightness    You can take Tylenol   as needed for fever reduction and pain relief.   For cough: honey 1/2 to 1 teaspoon (you can dilute the honey in water or another fluid).  You can also use guaifenesin  and dextromethorphan for cough. You can use a humidifier for chest congestion and cough.  If you don't have a humidifier, you can sit in the bathroom with the hot shower running.      For sore throat: try warm salt water gargles, cepacol lozenges, throat spray, warm tea or water with lemon/honey, popsicles or ice, or OTC cold relief medicine for throat discomfort.   For congestion: take a daily anti-histamine like Zyrtec , Claritin, and a oral decongestant, such as pseudoephedrine.  You can also use Flonase  1-2 sprays in each nostril daily.   It is important to stay hydrated: drink plenty of fluids (water, gatorade/powerade/pedialyte, juices, or teas) to keep your throat moisturized and help further relieve irritation/discomfort.

## 2024-10-25 NOTE — ED Triage Notes (Signed)
 Pt presents with complaints of runny nose, fever, sore throat, and chest congestion x 6 days ago. Chest congestion got better then got worse yesterday. Cough is productive with yellow sputum. Concerned for cold turning into pneumonia. Husband dx with URI. Mother in law has pneumonia.

## 2024-11-05 ENCOUNTER — Encounter: Payer: Self-pay | Admitting: Family Medicine

## 2024-11-05 DIAGNOSIS — F410 Panic disorder [episodic paroxysmal anxiety] without agoraphobia: Secondary | ICD-10-CM

## 2024-11-08 MED ORDER — ESCITALOPRAM OXALATE 10 MG PO TABS: 10.0000 mg | ORAL_TABLET | Freq: Every day | ORAL | 0 refills | Status: DC

## 2024-11-16 ENCOUNTER — Other Ambulatory Visit: Payer: Self-pay | Admitting: Medical Genetics

## 2024-11-19 ENCOUNTER — Other Ambulatory Visit
Admission: RE | Admit: 2024-11-19 | Discharge: 2024-11-19 | Disposition: A | Discharge status: Home / Self Care | Payer: Self-pay | Source: Ambulatory Visit | Attending: Medical Genetics | Admitting: Medical Genetics

## 2024-11-19 DIAGNOSIS — Z006 Encounter for examination for normal comparison and control in clinical research program: Secondary | ICD-10-CM | POA: Insufficient documentation

## 2024-11-23 ENCOUNTER — Encounter: Payer: Self-pay | Admitting: Family Medicine

## 2024-11-23 ENCOUNTER — Ambulatory Visit (INDEPENDENT_AMBULATORY_CARE_PROVIDER_SITE_OTHER): Payer: Self-pay | Admitting: Family Medicine

## 2024-11-23 VITALS — BP 122/80 | HR 82 | Ht 63.0 in | Wt 140.0 lb

## 2024-11-23 DIAGNOSIS — F411 Generalized anxiety disorder: Secondary | ICD-10-CM | POA: Diagnosis not present

## 2024-11-23 DIAGNOSIS — F410 Panic disorder [episodic paroxysmal anxiety] without agoraphobia: Secondary | ICD-10-CM

## 2024-11-23 DIAGNOSIS — R7401 Elevation of levels of liver transaminase levels: Secondary | ICD-10-CM

## 2024-11-23 LAB — HEPATIC FUNCTION PANEL
AG Ratio: 1.8 (calc) (ref 1.0–2.5)
ALT: 11 U/L (ref 6–29)
AST: 14 U/L (ref 10–30)
Albumin: 4.5 g/dL (ref 3.6–5.1)
Alkaline phosphatase (APISO): 58 U/L (ref 31–125)
Bilirubin, Direct: 0.1 mg/dL (ref 0.0–0.2)
Globulin: 2.5 g/dL (ref 1.9–3.7)
Indirect Bilirubin: 0.2 mg/dL (ref 0.2–1.2)
Total Bilirubin: 0.3 mg/dL (ref 0.2–1.2)
Total Protein: 7 g/dL (ref 6.1–8.1)

## 2024-11-23 MED ORDER — ESCITALOPRAM OXALATE 10 MG PO TABS: 10.0000 mg | ORAL_TABLET | Freq: Every day | ORAL | 3 refills | Status: AC

## 2024-11-23 NOTE — Progress Notes (Signed)
 Subjective:    Patient ID: April Fleming, female    DOB: Aug 25, 2001, 23 y.o.   MRN: [id number]  April Fleming is a 23 y.o. female presenting on 11/23/2024 for Anxiety and Elevated ALT Liver Enzyme   HPI  Discussed the use of AI scribe software for clinical note transcription with the patient, who gave verbal consent to proceed.  History of Present Illness   [name] is a 23 year old female who presents for follow-up on Lexapro  and liver enzyme levels.  Anxiety, Panic Attacks Started Lexapro  5mg  04/2024, initially success, used for 6 months, it was helpful, then last 2 months on it it seemed to have diminished effect Message on 11/05/24 - agreed to raise dose Lexapro  to 10mg  daily with improvement - Currently taking escitalopram  (Lexapro ) 10mg  for anxiety management - On 10 mg daily for three weeks with improved mood stability and feeling more balanced  Elevated hepatic transaminase ALT - Elevated ALT level of 36 in May 2025, isolated finding. - No prior history of elevated liver enzymes - No alcohol consumption except for two lifetime drinks - Normal blood counts with no evidence of anemia - No history of hyperlipidemia, no lab previously  Gastrointestinal concerns Already has upcoming GI apt for diarrhea / digestive problems. Upcoming gastroenterology appointment with Dr. Onita scheduled for January 04, 2025          11/23/2024   10:46 AM 05/05/2024    3:32 PM 03/20/2023    8:28 AM  Depression screen PHQ 2/9  Decreased Interest 0 0 0  Down, Depressed, Hopeless 0 0 1  PHQ - 2 Score 0 0 1  Altered sleeping 0 2 0  Tired, decreased energy 0 3 1  Change in appetite 0 0 0  Feeling bad or failure about yourself  0 0 0  Trouble concentrating 0 1 0  Moving slowly or fidgety/restless 0 0 0  Suicidal thoughts 0 0 0  PHQ-9 Score 0 6  2   Difficult doing work/chores Not difficult at all Somewhat difficult Not difficult at all     Data saved with a previous  flowsheet row definition       11/23/2024   10:24 AM 05/05/2024    3:32 PM 03/20/2023    8:28 AM 11/28/2022   10:11 AM  GAD 7 : Generalized Anxiety Score  Nervous, Anxious, on Edge 0 3 3 1   Control/stop worrying 0 3 3 1   Worry too much - different things 0 3 3 1   Trouble relaxing 0 2 0 0  Restless 0 0 0 0  Easily annoyed or irritable 0 3 0 1  Afraid - awful might happen 0 3 3 1   Total GAD 7 Score 0 17 12 5   Anxiety Difficulty Not difficult at all Somewhat difficult Not difficult at all Not difficult at all    Social History   Tobacco Use   Smoking status: Never   Smokeless tobacco: Never  Vaping Use   Vaping status: Former   Quit date: 05/12/2019   Substances: Nicotine, Flavoring   Devices: self-filled vaporizer  Substance Use Topics   Alcohol use: Not Currently    Comment: occasionally   Drug use: Never    Review of Systems Per HPI unless specifically indicated above     Objective:    BP 122/80 (BP Location: Right Arm, Patient Position: Sitting, Cuff Size: Normal)   Pulse 82   Ht 5' 3 (1.6 m)   Wt 140  lb (63.5 kg)   LMP  (LMP Unknown)   SpO2 95%   BMI 24.80 kg/m   Wt Readings from Last 3 Encounters:  11/23/24 140 lb (63.5 kg)  05/05/24 140 lb 8 oz (63.7 kg)  02/26/24 160 lb (72.6 kg)    Physical Exam Vitals and nursing note reviewed.  Constitutional:      General: She is not in acute distress.    Appearance: Normal appearance. She is well-developed. She is not diaphoretic.     Comments: Well-appearing, comfortable, cooperative  HENT:     Head: Normocephalic and atraumatic.  Eyes:     General:        Right eye: No discharge.        Left eye: No discharge.     Conjunctiva/sclera: Conjunctivae normal.  Cardiovascular:     Rate and Rhythm: Normal rate.  Pulmonary:     Effort: Pulmonary effort is normal.  Skin:    General: Skin is warm and dry.     Findings: No erythema or rash.  Neurological:     Mental Status: She is alert and oriented to person,  place, and time.  Psychiatric:        Mood and Affect: Mood normal.        Behavior: Behavior normal.        Thought Content: Thought content normal.     Comments: Well groomed, good eye contact, normal speech and thoughts     Results for orders placed or performed in visit on 05/05/24  HM PAP SMEAR   Collection Time: 04/23/24 12:00 AM  Result Value Ref Range   HM Pap smear      ASCUS (abnormal), Negative high risk HPV repeat 3 years  Hemoglobin A1c   Collection Time: 05/05/24  4:14 PM  Result Value Ref Range   Hgb A1c MFr Bld 5.2 <5.7 %   Mean Plasma Glucose 103 mg/dL   eAG (mmol/L) 5.7 mmol/L  CBC with Differential/Platelet   Collection Time: 05/05/24  4:14 PM  Result Value Ref Range   WBC 8.1 3.8 - 10.8 Thousand/uL   RBC 4.54 3.80 - 5.10 Million/uL   Hemoglobin 13.7 11.7 - 15.5 g/dL   HCT 58.5 64.9 - 54.9 %   MCV 91.2 80.0 - 100.0 fL   MCH 30.2 27.0 - 33.0 pg   MCHC 33.1 32.0 - 36.0 g/dL   RDW 87.3 88.9 - 84.9 %   Platelets 249 140 - 400 Thousand/uL   MPV 9.2 7.5 - 12.5 fL   Neutro Abs 3,750 1,500 - 7,800 cells/uL   Absolute Lymphocytes 3,394 850 - 3,900 cells/uL   Absolute Monocytes 624 200 - 950 cells/uL   Eosinophils Absolute 259 15 - 500 cells/uL   Basophils Absolute 73 0 - 200 cells/uL   Neutrophils Relative % 46.3 %   Total Lymphocyte 41.9 %   Monocytes Relative 7.7 %   Eosinophils Relative 3.2 %   Basophils Relative 0.9 %  Comprehensive metabolic panel with GFR   Collection Time: 05/05/24  4:14 PM  Result Value Ref Range   Glucose, Bld 89 65 - 139 mg/dL   BUN 18 7 - 25 mg/dL   Creat 9.12 9.49 - 9.03 mg/dL   eGFR 95 > OR = 60 fO/fpw/8.26f7   BUN/Creatinine Ratio SEE NOTE: 6 - 22 (calc)   Sodium 139 135 - 146 mmol/L   Potassium 4.0 3.5 - 5.3 mmol/L   Chloride 104 98 - 110 mmol/L   CO2 25 20 -  32 mmol/L   Calcium  9.9 8.6 - 10.2 mg/dL   Total Protein 7.3 6.1 - 8.1 g/dL   Albumin 4.7 3.6 - 5.1 g/dL   Globulin 2.6 1.9 - 3.7 g/dL (calc)   AG Ratio 1.8  1.0 - 2.5 (calc)   Total Bilirubin 0.3 0.2 - 1.2 mg/dL   Alkaline phosphatase (APISO) 68 31 - 125 U/L   AST 26 10 - 30 U/L   ALT 36 (H) 6 - 29 U/L      Assessment & Plan:   Problem List Items Addressed This Visit     Generalized anxiety disorder with panic attacks   Relevant Medications   escitalopram  (LEXAPRO ) 10 MG tablet   Other Visit Diagnoses       Elevated ALT measurement    -  Primary   Relevant Orders   Hepatic function panel        Generalized anxiety disorder Follow-up from 6 months ago, restarted SSRI Lexapro  Well-managed with Lexapro  10 mg, showing significant improvement in symptoms. Note the dose was 5mg  for 5+ months and then dose was increased 3 weeks ago from 5 to 10mg  with better results. - Prescribed Lexapro  10 mg daily, 90-day supply with refills for up to a year. - Advised to maintain current dose for at least six months before reconsidering changes.  Elevated liver transaminase level ALT Last lab 04/2024 with isolated Mildly elevated ALT at 36 with no significant history of liver enzyme elevation. Differential includes recent pregnancy, dietary factors, weight, or cholesterol. Reassurance today, seems highly unlikely to be significant problem very mild at young age without any other risk factors that are identified. - Ordered hepatic function panel today to follow-up - If hepatic function panel remains elevated, will consider checking cholesterol levels and possibly an ultrasound of the liver.        Orders Placed This Encounter  Procedures   Hepatic function panel    Meds ordered this encounter  Medications   escitalopram  (LEXAPRO ) 10 MG tablet    Sig: Take 1 tablet (10 mg total) by mouth daily.    Dispense:  90 tablet    Refill:  3    Add future refills for 10mg  dose.    Follow up plan: Return in about 6 months (around 05/24/2025) for 6 month Annual w/ fasting labs.   Marsa Officer, DO [facility] 11/23/2024, 10:44 AM

## 2024-11-23 NOTE — Patient Instructions (Addendum)
 Thank you for coming to the office today.  Refilled Lexapro  10mg  daily 90 day with refills  Liver Hepatic Function Panel test today  Please schedule a Follow-up Appointment to: Return in about 6 months (around 05/24/2025) for 6 month Annual w/ fasting labs.  If you have any other questions or concerns, please feel free to call the office or send a message through MyChart. You may also schedule an earlier appointment if necessary.  Additionally, you may be receiving a survey about your experience at our office within a few days to 1 week by e-mail or mail. We value your feedback.  Marsa Officer, DO [facility], [place]

## 2024-11-24 ENCOUNTER — Ambulatory Visit: Payer: Self-pay | Admitting: Family Medicine

## 2024-11-26 ENCOUNTER — Encounter: Payer: Self-pay | Admitting: Family Medicine

## 2024-11-29 ENCOUNTER — Ambulatory Visit: Payer: 59 | Admitting: Dermatology

## 2024-11-29 ENCOUNTER — Other Ambulatory Visit: Payer: Self-pay | Admitting: Medical Genetics

## 2024-11-29 DIAGNOSIS — Z006 Encounter for examination for normal comparison and control in clinical research program: Secondary | ICD-10-CM

## 2024-11-30 ENCOUNTER — Ambulatory Visit: Payer: Self-pay | Admitting: Dermatology

## 2024-12-02 ENCOUNTER — Encounter: Payer: Self-pay | Admitting: Dermatology

## 2024-12-02 ENCOUNTER — Ambulatory Visit (INDEPENDENT_AMBULATORY_CARE_PROVIDER_SITE_OTHER): Payer: Self-pay | Admitting: Dermatology

## 2024-12-02 DIAGNOSIS — Z86018 Personal history of other benign neoplasm: Secondary | ICD-10-CM

## 2024-12-02 DIAGNOSIS — Z808 Family history of malignant neoplasm of other organs or systems: Secondary | ICD-10-CM | POA: Diagnosis not present

## 2024-12-02 DIAGNOSIS — D229 Melanocytic nevi, unspecified: Secondary | ICD-10-CM

## 2024-12-02 DIAGNOSIS — L814 Other melanin hyperpigmentation: Secondary | ICD-10-CM

## 2024-12-02 DIAGNOSIS — W908XXA Exposure to other nonionizing radiation, initial encounter: Secondary | ICD-10-CM

## 2024-12-02 DIAGNOSIS — Z1283 Encounter for screening for malignant neoplasm of skin: Secondary | ICD-10-CM

## 2024-12-02 DIAGNOSIS — L578 Other skin changes due to chronic exposure to nonionizing radiation: Secondary | ICD-10-CM

## 2024-12-02 NOTE — Progress Notes (Unsigned)
° °  Follow-Up Visit   Subjective  April Fleming is a 23 y.o. female who presents for the following: Skin Cancer Screening and Full Body Skin Exam. Hx of DN. Patient reports she a spot under lt breast, rt flank side, lt forearm, lt knuckle.  Hx family of skin cancer, father passed away from melanoma on his back.  Maternal grandmother with hx of skin cancer.  The patient presents for Total-Body Skin Exam (TBSE) for skin cancer screening and mole check. The patient has spots, moles and lesions to be evaluated, some may be new or changing and the patient may have concern these could be cancer.    The following portions of the chart were reviewed this encounter and updated as appropriate: medications, allergies, medical history  Review of Systems:  No other skin or systemic complaints except as noted in HPI or Assessment and Plan.  Objective  Well appearing patient in no apparent distress; mood and affect are within normal limits.  A full examination was performed including scalp, head, eyes, ears, nose, lips, neck, chest, axillae, abdomen, back, buttocks, bilateral upper extremities, bilateral lower extremities, hands, feet, fingers, toes, fingernails, and toenails. All findings within normal limits unless otherwise noted below.   Relevant physical exam findings are noted in the Assessment and Plan.  Exam of nails limited by presence of nail polish.    Assessment & Plan   SKIN CANCER SCREENING PERFORMED TODAY.  HISTORY OF DYSPLASTIC NEVUS. Right shoulder, mild. 11/08/2021. No evidence of recurrence today Recommend regular full body skin exams Recommend daily broad spectrum sunscreen SPF 30+ to sun-exposed areas, reapply every 2 hours as needed.  Call if any new or changing lesions are noted between office visits  FAMILY HISTORY OF SKIN CANCER What type(s): MM Who affected: Father   ACTINIC DAMAGE - Chronic condition, secondary to cumulative UV/sun exposure - diffuse scaly  erythematous macules with underlying dyspigmentation - Recommend daily broad spectrum sunscreen SPF 30+ to sun-exposed areas, reapply every 2 hours as needed.  - Staying in the shade or wearing long sleeves, sun glasses (UVA+UVB protection) and wide brim hats (4-inch brim around the entire circumference of the hat) are also recommended for sun protection.  - Call for new or changing lesions.  LENTIGINES - Benign normal skin lesions - Benign-appearing - Call for any changes  MELANOCYTIC NEVI - Tan-brown and/or pink-flesh-colored symmetric macules and papules - Benign appearing on exam today - Observation - Call clinic for new or changing moles - Recommend daily use of broad spectrum spf 30+ sunscreen to sun-exposed areas.  -Check nails when remove polish. MULTIPLE BENIGN NEVI   ACTINIC ELASTOSIS   LENTIGINES   Return in about 1 year (around 12/02/2025) for TBSE, HxDN.  I, Jill Parcell, CMA, am acting as scribe for Boneta Sharps, MD.   Documentation: I have reviewed the above documentation for accuracy and completeness, and I agree with the above.  Boneta Sharps, MD

## 2024-12-02 NOTE — Patient Instructions (Signed)

## 2024-12-07 LAB — GENECONNECT MOLECULAR SCREEN: Genetic Analysis Overall Interpretation: NEGATIVE

## 2025-12-01 ENCOUNTER — Ambulatory Visit: Payer: Self-pay | Admitting: Dermatology
